# Patient Record
Sex: Male | Born: 1965 | Race: White | Hispanic: No | Marital: Single | Smoking: Current every day smoker
Health system: Southern US, Community
[De-identification: ages and names within clinical notes are randomized; demographics above are authoritative.]

## PROBLEM LIST (undated history)

## (undated) ENCOUNTER — Emergency Department (HOSPITAL_BASED_OUTPATIENT_CLINIC_OR_DEPARTMENT_OTHER): Admission: EM | Payer: BC Managed Care – PPO

## (undated) DIAGNOSIS — F419 Anxiety disorder, unspecified: Secondary | ICD-10-CM

## (undated) DIAGNOSIS — G4700 Insomnia, unspecified: Secondary | ICD-10-CM

## (undated) DIAGNOSIS — L719 Rosacea, unspecified: Secondary | ICD-10-CM

## (undated) DIAGNOSIS — K7010 Alcoholic hepatitis without ascites: Secondary | ICD-10-CM

## (undated) DIAGNOSIS — E785 Hyperlipidemia, unspecified: Secondary | ICD-10-CM

## (undated) DIAGNOSIS — I10 Essential (primary) hypertension: Secondary | ICD-10-CM

## (undated) DIAGNOSIS — M109 Gout, unspecified: Secondary | ICD-10-CM

## (undated) DIAGNOSIS — J45909 Unspecified asthma, uncomplicated: Secondary | ICD-10-CM

## (undated) DIAGNOSIS — J302 Other seasonal allergic rhinitis: Secondary | ICD-10-CM

## (undated) HISTORY — DX: Alcoholic hepatitis without ascites: K70.10

## (undated) HISTORY — PX: FISSURECTOMY: SHX5244

## (undated) HISTORY — DX: Gout, unspecified: M10.9

## (undated) HISTORY — PX: BACK SURGERY: SHX140

---

## 2000-12-19 ENCOUNTER — Encounter: Payer: Self-pay | Admitting: Internal Medicine

## 2000-12-19 ENCOUNTER — Emergency Department (HOSPITAL_COMMUNITY): Admission: EM | Admit: 2000-12-19 | Discharge: 2000-12-19 | Payer: Self-pay | Admitting: Emergency Medicine

## 2001-11-13 ENCOUNTER — Encounter: Admission: RE | Admit: 2001-11-13 | Discharge: 2001-11-13 | Payer: Self-pay | Admitting: Internal Medicine

## 2001-11-13 ENCOUNTER — Encounter: Payer: Self-pay | Admitting: Internal Medicine

## 2003-01-28 ENCOUNTER — Ambulatory Visit (HOSPITAL_COMMUNITY): Admission: RE | Admit: 2003-01-28 | Discharge: 2003-01-28 | Payer: Self-pay | Admitting: *Deleted

## 2005-09-20 ENCOUNTER — Encounter: Admission: RE | Admit: 2005-09-20 | Discharge: 2005-09-20 | Payer: Self-pay | Admitting: Internal Medicine

## 2011-02-01 ENCOUNTER — Other Ambulatory Visit: Payer: Self-pay | Admitting: Internal Medicine

## 2011-02-01 DIAGNOSIS — IMO0002 Reserved for concepts with insufficient information to code with codable children: Secondary | ICD-10-CM

## 2011-02-02 ENCOUNTER — Ambulatory Visit
Admission: RE | Admit: 2011-02-02 | Discharge: 2011-02-02 | Disposition: A | Discharge status: Home / Self Care | Payer: BC Managed Care – PPO | Source: Ambulatory Visit | Attending: Internal Medicine | Admitting: Internal Medicine

## 2011-02-02 DIAGNOSIS — IMO0002 Reserved for concepts with insufficient information to code with codable children: Secondary | ICD-10-CM

## 2011-02-02 LAB — HEPATITIS C ANTIBODY

## 2011-02-02 MED ORDER — IOHEXOL 180 MG/ML  SOLN
1.0000 mL | Freq: Once | INTRAMUSCULAR | Status: AC | PRN
  Administered 2011-02-02: 1 mL via EPIDURAL

## 2011-02-02 MED ORDER — METHYLPREDNISOLONE ACETATE 40 MG/ML INJ SUSP (RADIOLOG
120.0000 mg | Freq: Once | INTRAMUSCULAR | Status: AC
  Administered 2011-02-02: 120 mg via EPIDURAL

## 2011-02-15 ENCOUNTER — Other Ambulatory Visit: Payer: Self-pay | Admitting: Internal Medicine

## 2011-02-15 DIAGNOSIS — M549 Dorsalgia, unspecified: Secondary | ICD-10-CM

## 2011-02-16 ENCOUNTER — Ambulatory Visit
Admission: RE | Admit: 2011-02-16 | Discharge: 2011-02-16 | Disposition: A | Discharge status: Home / Self Care | Payer: BC Managed Care – PPO | Source: Ambulatory Visit | Attending: Internal Medicine | Admitting: Internal Medicine

## 2011-02-16 ENCOUNTER — Other Ambulatory Visit: Payer: Self-pay | Admitting: Internal Medicine

## 2011-02-16 DIAGNOSIS — M549 Dorsalgia, unspecified: Secondary | ICD-10-CM

## 2011-02-16 MED ORDER — IOHEXOL 180 MG/ML  SOLN
1.0000 mL | Freq: Once | INTRAMUSCULAR | Status: AC | PRN
  Administered 2011-02-16: 1 mL via EPIDURAL

## 2011-02-16 MED ORDER — METHYLPREDNISOLONE ACETATE 40 MG/ML INJ SUSP (RADIOLOG
120.0000 mg | Freq: Once | INTRAMUSCULAR | Status: AC
  Administered 2011-02-16: 120 mg via EPIDURAL

## 2011-06-20 ENCOUNTER — Ambulatory Visit: Payer: BC Managed Care – PPO | Attending: Orthopaedic Surgery | Admitting: Physical Therapy

## 2011-06-20 DIAGNOSIS — M6281 Muscle weakness (generalized): Secondary | ICD-10-CM | POA: Insufficient documentation

## 2011-06-20 DIAGNOSIS — M545 Low back pain, unspecified: Secondary | ICD-10-CM | POA: Insufficient documentation

## 2011-06-20 DIAGNOSIS — M25559 Pain in unspecified hip: Secondary | ICD-10-CM | POA: Insufficient documentation

## 2011-06-20 DIAGNOSIS — IMO0001 Reserved for inherently not codable concepts without codable children: Secondary | ICD-10-CM | POA: Insufficient documentation

## 2011-06-28 ENCOUNTER — Ambulatory Visit: Payer: BC Managed Care – PPO | Admitting: Physical Therapy

## 2011-07-03 ENCOUNTER — Encounter: Payer: BC Managed Care – PPO | Admitting: Physical Therapy

## 2011-07-05 ENCOUNTER — Ambulatory Visit: Payer: BC Managed Care – PPO | Admitting: Physical Therapy

## 2011-07-12 ENCOUNTER — Encounter: Payer: BC Managed Care – PPO | Admitting: Physical Therapy

## 2011-07-17 ENCOUNTER — Ambulatory Visit: Payer: BC Managed Care – PPO | Attending: Orthopaedic Surgery | Admitting: Physical Therapy

## 2011-07-17 DIAGNOSIS — M25559 Pain in unspecified hip: Secondary | ICD-10-CM | POA: Insufficient documentation

## 2011-07-17 DIAGNOSIS — M545 Low back pain, unspecified: Secondary | ICD-10-CM | POA: Insufficient documentation

## 2011-07-17 DIAGNOSIS — IMO0001 Reserved for inherently not codable concepts without codable children: Secondary | ICD-10-CM | POA: Insufficient documentation

## 2011-07-17 DIAGNOSIS — M6281 Muscle weakness (generalized): Secondary | ICD-10-CM | POA: Insufficient documentation

## 2011-07-24 ENCOUNTER — Ambulatory Visit: Payer: BC Managed Care – PPO | Admitting: Physical Therapy

## 2011-07-26 ENCOUNTER — Ambulatory Visit: Payer: BC Managed Care – PPO | Admitting: Physical Therapy

## 2011-07-31 ENCOUNTER — Ambulatory Visit: Payer: BC Managed Care – PPO | Admitting: Physical Therapy

## 2011-08-03 ENCOUNTER — Ambulatory Visit: Payer: BC Managed Care – PPO | Admitting: Physical Therapy

## 2011-08-14 ENCOUNTER — Ambulatory Visit: Payer: BC Managed Care – PPO | Admitting: Physical Therapy

## 2011-12-19 ENCOUNTER — Other Ambulatory Visit: Payer: Self-pay | Admitting: Internal Medicine

## 2011-12-19 DIAGNOSIS — M549 Dorsalgia, unspecified: Secondary | ICD-10-CM

## 2011-12-27 ENCOUNTER — Ambulatory Visit
Admission: RE | Admit: 2011-12-27 | Discharge: 2011-12-27 | Disposition: A | Discharge status: Home / Self Care | Payer: BC Managed Care – PPO | Source: Ambulatory Visit | Attending: Internal Medicine | Admitting: Internal Medicine

## 2011-12-27 DIAGNOSIS — M549 Dorsalgia, unspecified: Secondary | ICD-10-CM

## 2011-12-27 MED ORDER — IOHEXOL 180 MG/ML  SOLN
1.0000 mL | Freq: Once | INTRAMUSCULAR | Status: AC | PRN
  Administered 2011-12-27: 1 mL via EPIDURAL

## 2011-12-27 MED ORDER — METHYLPREDNISOLONE ACETATE 40 MG/ML INJ SUSP (RADIOLOG
120.0000 mg | Freq: Once | INTRAMUSCULAR | Status: AC
  Administered 2011-12-27: 120 mg via EPIDURAL

## 2012-01-01 ENCOUNTER — Telehealth: Payer: Self-pay | Admitting: Radiology

## 2012-01-01 NOTE — Telephone Encounter (Signed)
Pt c/o a lot of soreness, but did play 9 holes of golf this past weekend.  Also, is now seeing a chiropractor for adjustments and is using ice and heat prn. Asked if the injection can take enough of the inflammation away to make the pain go away. Explained that with time the injections could help with his pain. That he could have 3 every 6 months if they are working well for him.

## 2012-06-13 ENCOUNTER — Emergency Department (INDEPENDENT_AMBULATORY_CARE_PROVIDER_SITE_OTHER)
Admission: EM | Admit: 2012-06-13 | Discharge: 2012-06-13 | Disposition: A | Discharge status: Home / Self Care | Payer: BC Managed Care – PPO | Source: Home / Self Care

## 2012-06-13 ENCOUNTER — Encounter: Payer: Self-pay | Admitting: *Deleted

## 2012-06-13 DIAGNOSIS — R350 Frequency of micturition: Secondary | ICD-10-CM

## 2012-06-13 DIAGNOSIS — R300 Dysuria: Secondary | ICD-10-CM

## 2012-06-13 DIAGNOSIS — Z202 Contact with and (suspected) exposure to infections with a predominantly sexual mode of transmission: Secondary | ICD-10-CM

## 2012-06-13 HISTORY — DX: Anxiety disorder, unspecified: F41.9

## 2012-06-13 HISTORY — DX: Essential (primary) hypertension: I10

## 2012-06-13 HISTORY — DX: Unspecified asthma, uncomplicated: J45.909

## 2012-06-13 HISTORY — DX: Insomnia, unspecified: G47.00

## 2012-06-13 HISTORY — DX: Hyperlipidemia, unspecified: E78.5

## 2012-06-13 HISTORY — DX: Other seasonal allergic rhinitis: J30.2

## 2012-06-13 HISTORY — DX: Rosacea, unspecified: L71.9

## 2012-06-13 LAB — POCT URINALYSIS DIP (MANUAL ENTRY)
Bilirubin, UA: NEGATIVE
Glucose, UA: NEGATIVE
Ketones, POC UA: NEGATIVE
Leukocytes, UA: NEGATIVE
Nitrite, UA: NEGATIVE
pH, UA: 6 (ref 5–8)

## 2012-06-13 MED ORDER — CIPROFLOXACIN HCL 250 MG PO TABS: 250.0000 mg | ORAL_TABLET | Freq: Two times a day (BID) | ORAL | Status: DC

## 2012-06-13 NOTE — ED Provider Notes (Signed)
History     CSN: [id number]  Arrival date & time 06/13/12  1313   None     Chief Complaint  Patient presents with  . Urinary Frequency  . Abdominal Pain  . Flank Pain      HPI Comments: Patient complains of onset of increased urine frequency about 10 days ago without discomfort.  Two days ago he developed lower abdominal pressure sensation and mild bilateral flank discomfort.  Today he has had some vague discomfort in testicles.  He denies urethral discharge.  No fevers, chills, and sweats.  No recent sexual activity.  Patient is a 47 y.o. male presenting with frequency. The history is provided by the patient.  Urinary Frequency This is a new problem. Episode onset: 10 days ago. The problem occurs constantly. The problem has not changed since onset.Associated symptoms comments: Lower abdominal pressure. Nothing aggravates the symptoms. Nothing relieves the symptoms. He has tried nothing for the symptoms.    Past Medical History  Diagnosis Date  . Rosacea   . Anxiety   . Insomnia   . Hypertension   . Hyperlipemia   . Asthma   . Seasonal allergies     Past Surgical History  Procedure Laterality Date  . Back surgery    . Fissurectomy      History reviewed. No pertinent family history.  History  Substance Use Topics  . Smoking status: Current Every Day Smoker -- 0.25 packs/day for 25 years    Types: Cigarettes, Cigars  . Smokeless tobacco: Never Used  . Alcohol Use: Yes      Review of Systems  Genitourinary: Positive for frequency.  All other systems reviewed and are negative.    Allergies  Acetaminophen  Home Medications   Current Outpatient Rx  Name  Route  Sig  Dispense  Refill  . albuterol (PROVENTIL HFA;VENTOLIN HFA) 108 (90 BASE) MCG/ACT inhaler   Inhalation   Inhale 2 puffs into the lungs every 6 (six) hours as needed for wheezing.         Marland Kitchen ALPRAZolam (XANAX) 0.25 MG tablet   Oral   Take 0.25 mg by mouth at bedtime as needed for sleep.         Marland Kitchen amLODipine (NORVASC) 5 MG tablet   Oral   Take 5 mg by mouth daily.         Marland Kitchen ezetimibe (ZETIA) 10 MG tablet   Oral   Take 10 mg by mouth daily.         . Fluticasone-Salmeterol (ADVAIR) 250-50 MCG/DOSE AEPB   Inhalation   Inhale 1 puff into the lungs every 12 (twelve) hours.         . montelukast (SINGULAIR) 10 MG tablet   Oral   Take 10 mg by mouth at bedtime.         . sulfamethoxazole-trimethoprim (BACTRIM DS) 800-160 MG per tablet   Oral   Take 1 tablet by mouth 2 (two) times daily.         . ciprofloxacin (CIPRO) 250 MG tablet   Oral   Take 1 tablet (250 mg total) by mouth 2 (two) times daily.   14 tablet   0     BP 162/100  Pulse 84  Temp(Src) 98.1 F (36.7 C) (Oral)  Wt 188 lb (85.276 kg)  BMI 26.98 kg/m2  SpO2 96%  Physical Exam Nursing notes and Vital Signs reviewed. Appearance:  Patient appears healthy, stated age, and in no acute distress Eyes:  Pupils are  equal, round, and reactive to light and accomodation.  Extraocular movement is intact.  Conjunctivae are not inflamed  Pharynx:  Normal Neck:  Supple.   No adenopathy Lungs:  Clear to auscultation.  Breath sounds are equal.  Heart:  Regular rate and rhythm without murmurs, rubs, or gallops.  Abdomen:  Nontender without masses or hepatosplenomegaly.  Bowel sounds are present.  No CVA or flank tenderness.  Extremities:  No edema.   Skin:  No rash present.  Genitourinary:  Penis normal without lesions or urethral discharge.  Scrotum is normal.  Testes are descended bilaterally without nodules or tenderness.  No hernias are palpated.  No regional lymphadenopathy palpated   ED Course  Procedures  none  Labs Reviewed  URINE CULTURE pending  GC/CHLAMYDIA PROBE AMP, URINE pending  POCT URINALYSIS DIP (MANUAL ENTRY) SG >= 1.030, otherwise negative      1. Urinary frequency   2. Possible exposure to STD   3. Dysuria       MDM  GC/chlamydia, and urine culture pending. Begin  Cipro Increase fluid intake.  May take AZO for urinary discomfort. Followup with Family Doctor if not improved in one week.          Lattie Haw, MD 06/13/12 [phone]

## 2012-06-13 NOTE — ED Notes (Signed)
Pt c/o urinary frequency x 10 days, with lower abdominal, testicular, and flank pain x today. Denies fever.

## 2012-06-15 LAB — URINE CULTURE
Colony Count: NO GROWTH
Organism ID, Bacteria: NO GROWTH

## 2012-06-16 LAB — GC/CHLAMYDIA PROBE AMP, URINE
Chlamydia, Swab/Urine, PCR: NEGATIVE
GC Probe Amp, Urine: NEGATIVE

## 2012-06-17 ENCOUNTER — Telehealth: Payer: Self-pay | Admitting: *Deleted

## 2013-04-21 LAB — URIC ACID: URIC ACID: 6.4

## 2014-04-01 ENCOUNTER — Ambulatory Visit (INDEPENDENT_AMBULATORY_CARE_PROVIDER_SITE_OTHER): Payer: BC Managed Care – PPO | Admitting: Physical Therapy

## 2014-04-01 DIAGNOSIS — M25559 Pain in unspecified hip: Secondary | ICD-10-CM

## 2014-04-01 DIAGNOSIS — M545 Low back pain: Secondary | ICD-10-CM

## 2014-04-13 ENCOUNTER — Encounter (INDEPENDENT_AMBULATORY_CARE_PROVIDER_SITE_OTHER): Payer: BC Managed Care – PPO | Admitting: Physical Therapy

## 2014-04-13 DIAGNOSIS — M25559 Pain in unspecified hip: Secondary | ICD-10-CM

## 2014-04-13 DIAGNOSIS — M545 Low back pain: Secondary | ICD-10-CM

## 2014-05-25 LAB — CBC AND DIFFERENTIAL
HCT: 46 % (ref 41–53)
Hemoglobin: 16.3 g/dL (ref 13.5–17.5)
Platelets: 206 10*3/uL (ref 150–399)
WBC: 4.8 10^3/mL

## 2014-05-25 LAB — RAPID HIV SCREEN (HIV 1/2 AB+AG): HIV 1/O/2 Abs, Qual: NONREACTIVE

## 2014-05-25 LAB — RPR: RPR: NONREACTIVE

## 2014-05-25 LAB — PSA: PSA: 1.61

## 2014-05-25 LAB — LIPID PANEL
Cholesterol: 256 mg/dL — AB (ref 0–200)
LDL Cholesterol: 131 mg/dL
TRIGLYCERIDES: 392 mg/dL — AB (ref 40–160)

## 2014-05-25 LAB — TSH: TSH: 1.32 u[IU]/mL (ref <=5.90)

## 2014-05-25 LAB — HEMOGLOBIN A1C: Hemoglobin A1C: 5

## 2014-07-05 ENCOUNTER — Other Ambulatory Visit: Payer: Self-pay | Admitting: Internal Medicine

## 2014-07-05 DIAGNOSIS — M5136 Other intervertebral disc degeneration, lumbar region: Secondary | ICD-10-CM

## 2014-07-12 ENCOUNTER — Other Ambulatory Visit: Payer: Self-pay | Admitting: Internal Medicine

## 2014-07-12 DIAGNOSIS — M5432 Sciatica, left side: Secondary | ICD-10-CM

## 2014-07-12 DIAGNOSIS — M5136 Other intervertebral disc degeneration, lumbar region: Secondary | ICD-10-CM

## 2014-07-12 DIAGNOSIS — M5416 Radiculopathy, lumbar region: Secondary | ICD-10-CM

## 2014-07-15 ENCOUNTER — Ambulatory Visit
Admission: RE | Admit: 2014-07-15 | Discharge: 2014-07-15 | Disposition: A | Discharge status: Home / Self Care | Payer: BLUE CROSS/BLUE SHIELD | Source: Ambulatory Visit | Attending: Internal Medicine | Admitting: Internal Medicine

## 2014-07-15 DIAGNOSIS — M5416 Radiculopathy, lumbar region: Secondary | ICD-10-CM

## 2014-07-15 DIAGNOSIS — M5432 Sciatica, left side: Secondary | ICD-10-CM

## 2014-07-15 DIAGNOSIS — M5136 Other intervertebral disc degeneration, lumbar region: Secondary | ICD-10-CM

## 2014-07-15 MED ORDER — IOHEXOL 180 MG/ML  SOLN
1.0000 mL | Freq: Once | INTRAMUSCULAR | Status: AC | PRN
Start: 2014-07-15 — End: 2014-07-15
  Administered 2014-07-15: 1 mL via EPIDURAL

## 2014-07-15 MED ORDER — METHYLPREDNISOLONE ACETATE 40 MG/ML INJ SUSP (RADIOLOG
120.0000 mg | Freq: Once | INTRAMUSCULAR | Status: AC
  Administered 2014-07-15: 120 mg via EPIDURAL

## 2014-07-15 NOTE — Discharge Instructions (Signed)

## 2014-07-16 ENCOUNTER — Telehealth: Payer: Self-pay | Admitting: Radiology

## 2014-07-16 NOTE — Telephone Encounter (Signed)
Pt called and said he still had the same pain he has had. Wanted to know if injection had not worked because he still had pain. Explained it would take 2-5 days for the steroid to work and 7-10 days to know if it had taken care of his pain. Also explained that discomfort at the injection site is normal and will go away in a few days.

## 2014-07-28 ENCOUNTER — Other Ambulatory Visit: Payer: Self-pay | Admitting: Internal Medicine

## 2014-07-28 DIAGNOSIS — M5136 Other intervertebral disc degeneration, lumbar region: Secondary | ICD-10-CM

## 2014-08-03 ENCOUNTER — Other Ambulatory Visit: Payer: Self-pay | Admitting: Internal Medicine

## 2014-08-05 ENCOUNTER — Ambulatory Visit
Admission: RE | Admit: 2014-08-05 | Discharge: 2014-08-05 | Disposition: A | Discharge status: Home / Self Care | Payer: BLUE CROSS/BLUE SHIELD | Source: Ambulatory Visit | Attending: Internal Medicine | Admitting: Internal Medicine

## 2014-08-05 ENCOUNTER — Other Ambulatory Visit: Payer: BLUE CROSS/BLUE SHIELD

## 2014-08-05 DIAGNOSIS — M5136 Other intervertebral disc degeneration, lumbar region: Secondary | ICD-10-CM

## 2014-08-05 MED ORDER — METHYLPREDNISOLONE ACETATE 40 MG/ML INJ SUSP (RADIOLOG
120.0000 mg | Freq: Once | INTRAMUSCULAR | Status: AC
  Administered 2014-08-05: 120 mg via EPIDURAL

## 2014-08-05 MED ORDER — IOHEXOL 180 MG/ML  SOLN
1.0000 mL | Freq: Once | INTRAMUSCULAR | Status: AC | PRN
  Administered 2014-08-05: 1 mL via EPIDURAL

## 2014-08-05 NOTE — Discharge Instructions (Signed)

## 2015-02-03 LAB — BASIC METABOLIC PANEL
Creatinine: 1 mg/dL (ref 0.6–1.3)
Glucose: 96 mg/dL
POTASSIUM: 4.2 mmol/L (ref 3.4–5.3)
Sodium: 138 mmol/L (ref 137–147)

## 2015-02-03 LAB — HEPATIC FUNCTION PANEL
ALT: 100 U/L — AB (ref 10–40)
AST: 57 U/L — AB (ref 14–40)

## 2015-03-14 DIAGNOSIS — K2100 Gastro-esophageal reflux disease with esophagitis, without bleeding: Secondary | ICD-10-CM | POA: Insufficient documentation

## 2015-03-14 DIAGNOSIS — M5136 Other intervertebral disc degeneration, lumbar region: Secondary | ICD-10-CM | POA: Insufficient documentation

## 2015-03-14 DIAGNOSIS — M48061 Spinal stenosis, lumbar region without neurogenic claudication: Secondary | ICD-10-CM | POA: Insufficient documentation

## 2015-03-14 DIAGNOSIS — R748 Abnormal levels of other serum enzymes: Secondary | ICD-10-CM | POA: Insufficient documentation

## 2015-04-20 ENCOUNTER — Encounter: Payer: Self-pay | Admitting: *Deleted

## 2015-04-20 ENCOUNTER — Emergency Department (INDEPENDENT_AMBULATORY_CARE_PROVIDER_SITE_OTHER)
Admission: EM | Admit: 2015-04-20 | Discharge: 2015-04-20 | Disposition: A | Discharge status: Home / Self Care | Payer: BLUE CROSS/BLUE SHIELD | Source: Home / Self Care | Attending: Family Medicine | Admitting: Family Medicine

## 2015-04-20 DIAGNOSIS — J069 Acute upper respiratory infection, unspecified: Secondary | ICD-10-CM

## 2015-04-20 DIAGNOSIS — J019 Acute sinusitis, unspecified: Secondary | ICD-10-CM | POA: Diagnosis not present

## 2015-04-20 MED ORDER — AZITHROMYCIN 250 MG PO TABS: 250.0000 mg | ORAL_TABLET | Freq: Every day | ORAL | Status: DC

## 2015-04-20 NOTE — ED Notes (Signed)
Pt c/o productive cough and nasal congestion x 2 wks, worse x 1 day. Denies fever.

## 2015-04-20 NOTE — Discharge Instructions (Signed)
You may take 400-600mg Ibuprofen (Motrin) every 6-8 hours for fever and pain  °Alternate with Tylenol  °You may take 500mg Tylenol every 4-6 hours as needed for fever and pain  °Follow-up with your primary care provider next week for recheck of symptoms if not improving.  °Be sure to drink plenty of fluids and rest, at least 8hrs of sleep a night, preferably more while you are sick. °Return urgent care or go to closest ER if you cannot keep down fluids/signs of dehydration, fever not reducing with Tylenol, difficulty breathing/wheezing, stiff neck, worsening condition, or other concerns (see below)  °Please take antibiotics as prescribed and be sure to complete entire course even if you start to feel better to ensure infection does not come back. ° ° °Cool Mist Vaporizers °Vaporizers may help relieve the symptoms of a cough and cold. They add moisture to the air, which helps mucus to become thinner and less sticky. This makes it easier to breathe and cough up secretions. Cool mist vaporizers do not cause serious burns like hot mist vaporizers, which may also be called steamers or humidifiers. Vaporizers have not been proven to help with colds. You should not use a vaporizer if you are allergic to mold. °HOME CARE INSTRUCTIONS °· Follow the package instructions for the vaporizer. °· Do not use anything other than distilled water in the vaporizer. °· Do not run the vaporizer all of the time. This can cause mold or bacteria to grow in the vaporizer. °· Clean the vaporizer after each time it is used. °· Clean and dry the vaporizer well before storing it. °· Stop using the vaporizer if worsening respiratory symptoms develop. °  °This information is not intended to replace advice given to you by your health care provider. Make sure you discuss any questions you have with your health care provider. °  °Document Released: 12/29/2003 Document Revised: 04/07/2013 Document Reviewed: 08/20/2012 °Elsevier Interactive Patient  Education ©2016 [employer] ° °Sinus Rinse °WHAT IS A SINUS RINSE? °A sinus rinse is a home treatment. It rinses your sinuses with a mixture of salt and water (saline solution). Sinuses are air-filled spaces in your skull behind the bones of your face and forehead. They open into your nasal cavity. °To do a sinus rinse, you will need: °· Saline solution. °· Neti pot or spray bottle. This releases the saline solution into your nose and through your sinuses. You can buy neti pots and spray bottles at: °¨ Your local pharmacy. °¨ A health food store. °¨ Online. °WHEN WOULD I DO A SINUS RINSE?  °A sinus rinse can help to clear your nasal cavity. It can clear:  °· Mucus. °· Dirt. °· Dust. °· Pollen. °You may do a sinus rinse when you have: °· A cold. °· A virus. °· Allergies. °· A sinus infection. °· A stuffy nose. °If you are considering a sinus rinse: °· Ask your child's doctor before doing a sinus rinse on your child. °· Do not do a sinus rinse if you have had: °¨ Ear or nasal surgery. °¨ An ear infection. °¨ Blocked ears. °HOW DO I DO A SINUS RINSE?  °· Wash your hands. °· Disinfect your device using the directions that came with the device. °· Dry your device. °· Use the solution that comes with your device or one that is sold separately in stores. Follow the mixing directions on the package. °· Fill your device with the amount of saline solution as stated in the device instructions. °·   Stand over a sink and tilt your head sideways over the sink. °· Place the spout of the device in your upper nostril (the one closer to the ceiling). °· Gently pour or squeeze the saline solution into the nasal cavity. The liquid should drain to the lower nostril if you are not too congested. °· Gently blow your nose. Blowing too hard may cause ear pain. °· Repeat in the other nostril. °· Clean and rinse your device with clean water. °· Air-dry your device. °ARE THERE RISKS OF A SINUS RINSE?  °Sinus rinse is normally very safe and  helpful. However, there are a few risks, which include:  °· A burning feeling in the sinuses. This may happen if you do not make the saline solution as instructed. Make sure to follow all directions when making the saline solution. °· Infection from unclean water. This is rare, but possible. °· Nasal irritation. °  °This information is not intended to replace advice given to you by your health care provider. Make sure you discuss any questions you have with your health care provider. °  °Document Released: 10/28/2013 Document Reviewed: 10/28/2013 °Elsevier Interactive Patient Education ©2016 [employer] ° °

## 2015-04-20 NOTE — ED Provider Notes (Signed)
CSN: [id number]     Arrival date & time 04/20/15  1230 History   First MD Initiated Contact with Patient 04/20/15 1235     Chief Complaint  Patient presents with  . Cough   (Consider location/radiation/quality/duration/timing/severity/associated sxs/prior Treatment) HPI  Pt is a 50yo male presenting to [facility] with c/o 2 weeks of mild congestion with mild intermittent cough that has worsened suddenly yesterday with facial pain and nasal congestion.  He states he recently lost his PCP and looking for a new one.  States he typically gets a sinus infection about twice a year. Pt states Azithromycin works best for him as other antibiotics cause GI upset so he never finishes the course.  Denies fever, n/v/d.  Pt states "everyone" around him is also sick. Denies recent travel.  He has been using his Ventolin more recently.    Past Medical History  Diagnosis Date  . Rosacea   . Anxiety   . Insomnia   . Hypertension   . Hyperlipemia   . Asthma   . Seasonal allergies    Past Surgical History  Procedure Laterality Date  . Back surgery    . Fissurectomy     History reviewed. No pertinent family history. Social History  Substance Use Topics  . Smoking status: Current Every Day Smoker -- 0.25 packs/day for 25 years    Types: Cigarettes, Cigars  . Smokeless tobacco: Never Used  . Alcohol Use: Yes    Review of Systems  Constitutional: Negative for fever and chills.  HENT: Positive for congestion, ear pain, postnasal drip, rhinorrhea, sinus pressure and sneezing. Negative for sore throat, trouble swallowing and voice change.   Respiratory: Positive for cough and shortness of breath.   Cardiovascular: Negative for chest pain and palpitations.  Gastrointestinal: Negative for nausea, vomiting, abdominal pain and diarrhea.  Musculoskeletal: Negative for myalgias, back pain and arthralgias.  Skin: Negative for rash.    Allergies  Tylenol  Home Medications   Prior to Admission medications    Medication Sig Start Date End Date Taking? Authorizing Provider  ADVAIR DISKUS 100-50 MCG/DOSE AEPB  04/11/14  Yes Historical Provider, MD  albuterol (PROVENTIL HFA;VENTOLIN HFA) 108 (90 BASE) MCG/ACT inhaler Inhale 2 puffs into the lungs every 6 (six) hours as needed for wheezing.   Yes Historical Provider, MD  ALPRAZolam Prudy Feeler(XANAX) 0.25 MG tablet Take 0.25 mg by mouth at bedtime as needed for sleep.   Yes Historical Provider, MD  ezetimibe (ZETIA) 10 MG tablet Take 10 mg by mouth daily.   Yes Historical Provider, MD  irbesartan (AVAPRO) 300 MG tablet Take 300 mg by mouth daily.   Yes Historical Provider, MD  simvastatin (ZOCOR) 10 MG tablet Take 10 mg by mouth daily.   Yes Historical Provider, MD  amLODipine (NORVASC) 5 MG tablet Take 5 mg by mouth daily.    Historical Provider, MD  azithromycin (ZITHROMAX) 250 MG tablet Take 1 tablet (250 mg total) by mouth daily. Take first 2 tablets together, then 1 every day until finished. 04/20/15   Junius FinnerErin O'Malley, PA-C  fluticasone Aleda Grana(FLONASE) 50 MCG/ACT nasal spray  04/11/14   Historical Provider, MD  Fluticasone-Salmeterol (ADVAIR) 250-50 MCG/DOSE AEPB Inhale 1 puff into the lungs every 12 (twelve) hours.    Historical Provider, MD  lansoprazole (PREVACID) 30 MG capsule  04/11/14   Historical Provider, MD  montelukast (SINGULAIR) 10 MG tablet Take 10 mg by mouth at bedtime.    Historical Provider, MD   Meds Ordered and Administered this Visit  Medications - No data to display  BP 154/92 mmHg  Pulse 77  Temp(Src) 98.2 F (36.8 C) (Oral)  Resp 16  Ht 5\' 10"  (1.778 m)  Wt 186 lb (84.369 kg)  BMI 26.69 kg/m2  SpO2 97% No data found.   Physical Exam  Constitutional: He appears well-developed and well-nourished.  HENT:  Head: Normocephalic and atraumatic.  Right Ear: Hearing, tympanic membrane, external ear and ear canal normal.  Left Ear: Hearing, tympanic membrane, external ear and ear canal normal.  Nose: Mucosal edema present. Right sinus  exhibits maxillary sinus tenderness and frontal sinus tenderness. Left sinus exhibits maxillary sinus tenderness. Left sinus exhibits no frontal sinus tenderness.  Mouth/Throat: Uvula is midline, oropharynx is clear and moist and mucous membranes are normal.  Eyes: Conjunctivae are normal. No scleral icterus.  Neck: Normal range of motion. Neck supple.  Cardiovascular: Normal rate, regular rhythm and normal heart sounds.   Pulmonary/Chest: Effort normal and breath sounds normal. No stridor. No respiratory distress. He has no wheezes. He has no rales. He exhibits no tenderness.  Abdominal: Soft. He exhibits no distension and no mass. There is no tenderness. There is no rebound and no guarding.  Musculoskeletal: Normal range of motion.  Lymphadenopathy:    He has no cervical adenopathy.  Neurological: He is alert.  Skin: Skin is warm and dry.  Nursing note and vitals reviewed.   ED Course  Procedures (including critical care time)  Labs Review Labs Reviewed - No data to display  Imaging Review No results found.    MDM   1. Acute rhinosinusitis   2. Acute upper respiratory infection    Pt c/o worsening URI symptoms and sinus congestion with sinus pressure. Hx of recurrent sinus infections.   Rx: azithromycin Advised pt to use acetaminophen and ibuprofen as needed for fever and pain. Encouraged rest and fluids. F/u with PCP in 7-10 days if not improving, sooner if worsening. Pt verbalized understanding and agreement with tx plan.     Junius Finner, PA-C 04/20/15 1342

## 2015-04-24 ENCOUNTER — Telehealth: Payer: Self-pay | Admitting: Emergency Medicine

## 2015-04-25 ENCOUNTER — Ambulatory Visit: Payer: BLUE CROSS/BLUE SHIELD | Admitting: Family Medicine

## 2015-04-25 ENCOUNTER — Ambulatory Visit: Payer: BLUE CROSS/BLUE SHIELD | Admitting: Osteopathic Medicine

## 2015-04-28 ENCOUNTER — Ambulatory Visit (INDEPENDENT_AMBULATORY_CARE_PROVIDER_SITE_OTHER): Payer: BLUE CROSS/BLUE SHIELD | Admitting: Family Medicine

## 2015-04-28 ENCOUNTER — Encounter: Payer: Self-pay | Admitting: Family Medicine

## 2015-04-28 VITALS — BP 137/93 | HR 86 | Wt 187.0 lb

## 2015-04-28 DIAGNOSIS — F411 Generalized anxiety disorder: Secondary | ICD-10-CM

## 2015-04-28 DIAGNOSIS — M4806 Spinal stenosis, lumbar region: Secondary | ICD-10-CM

## 2015-04-28 DIAGNOSIS — R945 Abnormal results of liver function studies: Secondary | ICD-10-CM

## 2015-04-28 DIAGNOSIS — M25562 Pain in left knee: Secondary | ICD-10-CM

## 2015-04-28 DIAGNOSIS — K7010 Alcoholic hepatitis without ascites: Secondary | ICD-10-CM | POA: Insufficient documentation

## 2015-04-28 DIAGNOSIS — E785 Hyperlipidemia, unspecified: Secondary | ICD-10-CM | POA: Diagnosis not present

## 2015-04-28 DIAGNOSIS — E781 Pure hyperglyceridemia: Secondary | ICD-10-CM | POA: Insufficient documentation

## 2015-04-28 DIAGNOSIS — I10 Essential (primary) hypertension: Secondary | ICD-10-CM | POA: Diagnosis not present

## 2015-04-28 DIAGNOSIS — H31001 Unspecified chorioretinal scars, right eye: Secondary | ICD-10-CM

## 2015-04-28 DIAGNOSIS — R7989 Other specified abnormal findings of blood chemistry: Secondary | ICD-10-CM

## 2015-04-28 DIAGNOSIS — J4530 Mild persistent asthma, uncomplicated: Secondary | ICD-10-CM

## 2015-04-28 DIAGNOSIS — H31009 Unspecified chorioretinal scars, unspecified eye: Secondary | ICD-10-CM | POA: Insufficient documentation

## 2015-04-28 DIAGNOSIS — M48061 Spinal stenosis, lumbar region without neurogenic claudication: Secondary | ICD-10-CM

## 2015-04-28 DIAGNOSIS — M109 Gout, unspecified: Secondary | ICD-10-CM | POA: Diagnosis not present

## 2015-04-28 DIAGNOSIS — L719 Rosacea, unspecified: Secondary | ICD-10-CM

## 2015-04-28 DIAGNOSIS — E782 Mixed hyperlipidemia: Secondary | ICD-10-CM | POA: Insufficient documentation

## 2015-04-28 HISTORY — DX: Gout, unspecified: M10.9

## 2015-04-28 MED ORDER — AMLODIPINE BESYLATE 5 MG PO TABS: 5.0000 mg | ORAL_TABLET | Freq: Every day | ORAL | Status: DC

## 2015-04-28 MED ORDER — EZETIMIBE 10 MG PO TABS: 10.0000 mg | ORAL_TABLET | Freq: Every day | ORAL | Status: DC

## 2015-04-28 MED ORDER — MONTELUKAST SODIUM 10 MG PO TABS: 10.0000 mg | ORAL_TABLET | Freq: Every day | ORAL | Status: DC

## 2015-04-28 MED ORDER — ALPRAZOLAM 0.25 MG PO TABS: 0.2500 mg | ORAL_TABLET | Freq: Two times a day (BID) | ORAL | Status: DC | PRN

## 2015-04-28 MED ORDER — ADVAIR DISKUS 100-50 MCG/DOSE IN AEPB: 1.0000 | INHALATION_SPRAY | Freq: Two times a day (BID) | RESPIRATORY_TRACT | Status: DC

## 2015-04-28 MED ORDER — COLCHICINE 0.6 MG PO TABS: 0.6000 mg | ORAL_TABLET | Freq: Every day | ORAL | Status: DC | PRN

## 2015-04-28 MED ORDER — ALBUTEROL SULFATE HFA 108 (90 BASE) MCG/ACT IN AERS: INHALATION_SPRAY | RESPIRATORY_TRACT | Status: DC

## 2015-04-28 MED ORDER — EPLERENONE 25 MG PO TABS: 25.0000 mg | ORAL_TABLET | Freq: Every day | ORAL | Status: DC

## 2015-04-28 MED ORDER — GABAPENTIN 100 MG PO CAPS: 100.0000 mg | ORAL_CAPSULE | Freq: Two times a day (BID) | ORAL | Status: DC

## 2015-04-28 MED ORDER — IRBESARTAN 300 MG PO TABS: 300.0000 mg | ORAL_TABLET | Freq: Every day | ORAL | Status: DC

## 2015-04-28 MED ORDER — LANSOPRAZOLE 30 MG PO CPDR: 30.0000 mg | DELAYED_RELEASE_CAPSULE | Freq: Every day | ORAL | Status: DC

## 2015-04-28 NOTE — Progress Notes (Signed)
CC: Gabriel Wong is a 50 y.o. male is here for Establish Care and Medication Management   Subjective: HPI:  Gabriel Wong 50 year old here to establish care  He has a history of asthma that's been spanning back for decades. He gets about 2 asthma exacerbations here despite using Singulair and Advair. He just recently got over an asthma exacerbation that was treated with albuterol and azithromycin. He denies any wheezing or shortness of breath at the present time. Provided he is not sick from a respiratory illness he does not need albuterol.  He has a history of essential hypertension currently taking amlodipine,eplerenone, and Avapro. Outside blood pressure review has shown that as recently as one year ago he's had stage II hypertension despite taking this. No recent blood pressures report. He reports 100% compliance with his hypertension medication.  He has a history of hyperlipidemia with hypertriglyceridemia. He is currently taking Zetia on a daily basis. He was encouraged to start on a statin by his former provider but he had a bad experience with Lipitor and decided not to start taking simvastatin. Most recent lipid panel was last fall with LDL of 130 and triglycerides in the 300s.  He suffers from gout with flares a a few times every year. It always involves one of his great toes. It goes away within a day he takes colchicine.  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 His ophthalmologist started him on eplerenone at that time and has prevented any further damage from occurring to the retina. He denies any vision loss. He denies any ocular pain. He's had his potassium checked frequently since being on this medication and has never had hyperkalemia.  He doesn't he suffers from anxiety and panic attacks for the majority of his life and has been worsened ever since he lost custody of his 2 children following the divorce of  his now ex-wife. He takes half to a full tablet of Xanax twice a day to help with anxiety. He is quite happy with his current regimen right now. Gabriel Wong controlled substance database was reviewed and confirms his report of prior prescriptions.  Complains of left knee pain that has been going on for over a year now. He's had x-rays done which were unremarkable. He tells me he gets the sudden pop sensation and feels like his knee is almost dislocated. This will last for a few days and without any intervention it will make another popping sensation and instantly feels better. Right now he is not expense in any pain. He denies any swelling or redness localizes the pain behind the kneecap.  He has a history of spinal stenosis that resulted in a laminectomy. He tells me his back pain is minimal, almost absent and well managed with gabapentin right now. He denies any motor or sensory disturbances in the lower extremities.  He has a history of elevated LFTs which was investigated with undetectable hepatitis C antibody, upper endoscopy, and abdominal ultrasound which showed fatty infiltration of the liver. He tells me these other LFTs were due to using alcohol to self medicate his chronic back pain in the past. On outside records it looks like he's also gotten in trouble with the law due to his drinking. He tells me he takes 10-12 drinks a week. Currently denies right upper quadrant pain.  He also has a history of rosacea which she currently is not taking anything for about one time  was taking doxycycline.   Review of Systems - General ROS: negative for - chills, fever, night sweats, weight gain or weight loss Ophthalmic ROS: negative for - decreased vision ENT ROS: negative for - hearing change, nasal congestion, tinnitus or allergies Hematological and Lymphatic ROS: negative for - bleeding problems, bruising or swollen lymph nodes Breast ROS: negative Respiratory ROS: no cough, shortness of breath,  or wheezing Cardiovascular ROS: no chest pain or dyspnea on exertion Gastrointestinal ROS: no abdominal pain, change in bowel habits, or black or bloody stools Genito-Urinary ROS: negative for - genital discharge, genital ulcers, incontinence or abnormal bleeding from genitals Musculoskeletal ROS: negative for - joint pain or muscle pain Neurological ROS: negative for - headaches or memory loss Dermatological ROS: negative for lumps, mole changes, rash and skin lesion changes  Past Medical History  Diagnosis Date  . Rosacea   . Anxiety   . Insomnia   . Hypertension   . Hyperlipemia   . Asthma   . Seasonal allergies   . Alcoholic hepatitis   . Gout 04/28/2015    Past Surgical History  Procedure Laterality Date  . Back surgery    . Fissurectomy     No family history on file.  Social History   Social History  . Marital Status: Single    Spouse Name: N/A  . Number of Children: N/A  . Years of Education: N/A   Occupational History  . Not on file.   Social History Main Topics  . Smoking status: Current Every Day Smoker -- 0.25 packs/day for 25 years    Types: Cigarettes, Cigars  . Smokeless tobacco: Never Used  . Alcohol Use: Yes  . Drug Use: No  . Sexual Activity: Not on file   Other Topics Concern  . Not on file   Social History Narrative     Objective: BP 137/93 mmHg  Pulse 86  Wt 187 lb (84.823 kg)  SpO2 95%  General: Alert and Oriented, No Acute Distress HEENT: Pupils equal, round, reactive to light. Conjunctivae clear.  Moist mucous membranes pharynx unremarkable Lungs: Clear to auscultation bilaterally, no wheezing/ronchi/rales.  Comfortable work of breathing. Good air movement. Cardiac: Regular rate and rhythm. Normal S1/S2.  No murmurs, rubs, nor gallops.   Abdomen: soft and flat Extremities: No peripheral edema.  Strong peripheral pulses.  Mental Status: No depression, anxiety, nor agitation. Skin: Warm and dry.  Assessment & Plan: Gabriel Wong was seen  today for establish care and medication management.  Diagnoses and all orders for this visit:  Asthma, mild persistent, uncomplicated  Essential hypertension  Hyperlipidemia  Gout without tophus, unspecified cause, unspecified chronicity, unspecified site  Retinal scar, right  Generalized anxiety disorder  Left knee pain  Spinal stenosis of lumbar region  Elevated LFTs  Rosacea  Other orders -     colchicine 0.6 MG tablet; Take 1 tablet (0.6 mg total) by mouth daily as needed. -     eplerenone (INSPRA) 25 MG tablet; Take 1 tablet (25 mg total) by mouth daily. -     ALPRAZolam (XANAX) 0.25 MG tablet; Take 1 tablet (0.25 mg total) by mouth 2 (two) times daily as needed for anxiety. -     Discontinue: gabapentin (NEURONTIN) 100 MG capsule; Take 1 capsule (100 mg total) by mouth 2 (two) times daily. -     ADVAIR DISKUS 100-50 MCG/DOSE AEPB; Inhale 1 puff into the lungs 2 (two) times daily. -     ezetimibe (ZETIA) 10 MG tablet; Take 1  tablet (10 mg total) by mouth daily. -     albuterol (PROVENTIL HFA;VENTOLIN HFA) 108 (90 Base) MCG/ACT inhaler; Inhale two puffs every 4-6 hours only as needed for shortness of breath or wheezing. -     amLODipine (NORVASC) 5 MG tablet; Take 1 tablet (5 mg total) by mouth daily. -     gabapentin (NEURONTIN) 100 MG capsule; Take 1 capsule (100 mg total) by mouth 2 (two) times daily. -     lansoprazole (PREVACID) 30 MG capsule; Take 1 capsule (30 mg total) by mouth daily. -     irbesartan (AVAPRO) 300 MG tablet; Take 1 tablet (300 mg total) by mouth daily. -     montelukast (SINGULAIR) 10 MG tablet; Take 1 tablet (10 mg total) by mouth daily.   Asthma: Controlled with Advair and Singulair no changes to medication regimen Essential hypertension: Uncontrolled, no changes to his current medication regimen I believe strongly that he could help lower his diastolic pressure and become controlled if he cuts out salt in his diet and begins  exercising Hyperlipidemia: have asked him to return in about 3 months for complete physical exam so we can check a lipid panel. It is 10 year AHA risk as above 7.5% I will offer him little low Gout: currently controlled with as needed colchicine use if he begins to have 3 or more outbreaks a year I will offer him allopurinol.  Retinal scar: Encouraged to keep follow-up appointments with ophthalmology, will refill eplerenone with recent potassium being normal. Anxiety: Controlled with Xanax, no suspicious behaviors Left knee pain: Encouraged to get an MRI due to suspicion for meniscal tear. He politely declines and would like to wait until pain returns  spinal stenosis: Controlled with gabapentin Elevated LFTs: Encouraged to reduce alcohol intake as much as possible Rosacea: Controlled no need for intervention at this time  60 minutes spent face-to-face during visit today of which at least 50% was counseling or coordinating care regarding: 1. Asthma, mild persistent, uncomplicated   2. Essential hypertension   3. Hyperlipidemia   4. Gout without tophus, unspecified cause, unspecified chronicity, unspecified site   5. Retinal scar, right   6. Generalized anxiety disorder   7. Left knee pain   8. Spinal stenosis of lumbar region   9. Elevated LFTs   10. Rosacea      Return in about 3 months (around 07/27/2015) for CPE.

## 2015-05-05 NOTE — Addendum Note (Signed)
Addended by: Collie Siad on: 05/05/2015 04:03 PM   Modules accepted: Orders

## 2015-05-11 ENCOUNTER — Encounter: Payer: Self-pay | Admitting: Family Medicine

## 2015-06-03 ENCOUNTER — Telehealth: Payer: Self-pay | Admitting: Family Medicine

## 2015-06-03 ENCOUNTER — Other Ambulatory Visit: Payer: Self-pay

## 2015-06-03 MED ORDER — FLUTICASONE PROPIONATE 50 MCG/ACT NA SUSP: 2.0000 | Freq: Every day | NASAL | Status: DC

## 2015-06-03 NOTE — Telephone Encounter (Signed)
Pt called. He would like refill on his Flonase 62mcg-not on his med list because he forgot to mention it to you.  He uses [facility] and their telephone number is [phone]

## 2015-06-03 NOTE — Telephone Encounter (Signed)
Rx sent to pharmacy   

## 2015-06-07 ENCOUNTER — Ambulatory Visit (INDEPENDENT_AMBULATORY_CARE_PROVIDER_SITE_OTHER): Payer: BLUE CROSS/BLUE SHIELD | Admitting: Family Medicine

## 2015-06-07 ENCOUNTER — Encounter: Payer: Self-pay | Admitting: Family Medicine

## 2015-06-07 VITALS — BP 144/86 | HR 80 | Wt 190.0 lb

## 2015-06-07 DIAGNOSIS — M109 Gout, unspecified: Secondary | ICD-10-CM | POA: Diagnosis not present

## 2015-06-07 DIAGNOSIS — R109 Unspecified abdominal pain: Secondary | ICD-10-CM | POA: Diagnosis not present

## 2015-06-07 NOTE — Progress Notes (Signed)
CC: Gabriel Wong is a 50 y.o. male is here for Gout   Subjective: HPI:    3 days ago he began to right flank pain. Is localized in the right flank and radiates nowhere. It's worse with any twisting motion. It's absent if he sits still. He denies any genitourinary complaints such as hematuria, dysuria, or urinary urgency. Denies nausea or vomiting. It slowly improving without any intervention. He believes it happened soon after riding a motorcycle for almost 3 hours straight after not riding at all throughout the winter. Denies any midline pain or groin pain  He's had a flareup of gout in his right toe. It came on after he  Enjoyed numerous beverages over the weekend and ate fried mushrooms which is also a known trigger. It slowly been improving with taking colchicine twice a day. He's also been using ibuprofen 200 mg 3 times a day and is curious to know if it safe to take more than this or if it will harm his liver. Denies joint pain elsewhere   Review Of Systems Outlined In HPI  Past Medical History  Diagnosis Date  . Rosacea   . Anxiety   . Insomnia   . Hypertension   . Hyperlipemia   . Asthma   . Seasonal allergies   . Alcoholic hepatitis   . Gout 04/28/2015    Past Surgical History  Procedure Laterality Date  . Back surgery    . Fissurectomy     No family history on file.  Social History   Social History  . Marital Status: Single    Spouse Name: N/A  . Number of Children: N/A  . Years of Education: N/A   Occupational History  . Not on file.   Social History Main Topics  . Smoking status: Current Every Day Smoker -- 0.25 packs/day for 25 years    Types: Cigarettes, Cigars  . Smokeless tobacco: Never Used  . Alcohol Use: Yes  . Drug Use: No  . Sexual Activity: Not on file   Other Topics Concern  . Not on file   Social History Narrative     Objective: BP 144/86 mmHg  Pulse 80  Wt 190 lb (86.183 kg)  Vital signs reviewed. General: Alert and Oriented,  No Acute Distress HEENT: Pupils equal, round, reactive to light. Conjunctivae clear.  External ears unremarkable.  Moist mucous membranes. Lungs: Clear and comfortable work of breathing, speaking in full sentences without accessory muscle use. Cardiac: Regular rate and rhythm.  Neuro: CN II-XII grossly intact, gait normal. Full range of motion and strength in the thoracic lumbar spine. No CVA tenderness. Extremities: No peripheral edema.  Strong peripheral pulses. Right great toe is normal without redness, swelling or warmth. Mental Status: No depression, anxiety, nor agitation. Logical though process. Skin: Warm and dry.   Assessment & Plan: Havish was seen today for gout.  Diagnoses and all orders for this visit:  Gout without tophus, unspecified cause, unspecified chronicity, unspecified site  Right flank pain   Gout: Recent flare adequately treated with colchicine, discussed he can increase his ibuprofen to 400 mg 3 times a day if needed which should not pose a threat to his liver. Right flank pain: High suspicion for muscle skeletal strain, low suspicion for kidney stone. I've offered to collect a urinalysis to look for blood however he politely declines.   Return if symptoms worsen or fail to improve.

## 2015-06-29 ENCOUNTER — Telehealth: Payer: Self-pay | Admitting: Family Medicine

## 2015-06-29 ENCOUNTER — Other Ambulatory Visit: Payer: Self-pay | Admitting: Family Medicine

## 2015-06-29 MED ORDER — GABAPENTIN 100 MG PO CAPS: 100.0000 mg | ORAL_CAPSULE | Freq: Four times a day (QID) | ORAL | Status: DC | PRN

## 2015-06-29 NOTE — Telephone Encounter (Signed)
Rx has been updated with his pharmacy

## 2015-06-29 NOTE — Telephone Encounter (Signed)
Please advise 

## 2015-06-29 NOTE — Telephone Encounter (Signed)
Patient called adv that he is out of Gabapentin which was called in as a supply. He is requesting to have a dosage increase because he is needing to take more than what was prescribed taking like 3 or 4 pills a day which is really helping. He said he knows it may be early to fill but he is almost out and adv that his pharmacy is sending over a request for Ventolin he has no refills at all. Thanks

## 2015-07-26 ENCOUNTER — Other Ambulatory Visit: Payer: Self-pay | Admitting: Family Medicine

## 2015-07-28 ENCOUNTER — Ambulatory Visit (INDEPENDENT_AMBULATORY_CARE_PROVIDER_SITE_OTHER): Payer: BLUE CROSS/BLUE SHIELD | Admitting: Family Medicine

## 2015-07-28 ENCOUNTER — Encounter: Payer: Self-pay | Admitting: Family Medicine

## 2015-07-28 VITALS — BP 143/90 | HR 71 | Wt 188.0 lb

## 2015-07-28 DIAGNOSIS — Z0000 Encounter for general adult medical examination without abnormal findings: Secondary | ICD-10-CM | POA: Diagnosis not present

## 2015-07-28 LAB — COMPLETE METABOLIC PANEL WITH GFR
ALBUMIN: 5 g/dL (ref 3.6–5.1)
ALK PHOS: 53 U/L (ref 40–115)
ALT: 59 U/L — AB (ref 9–46)
AST: 54 U/L — ABNORMAL HIGH (ref 10–35)
BILIRUBIN TOTAL: 1 mg/dL (ref 0.2–1.2)
BUN: 12 mg/dL (ref 7–25)
CO2: 25 mmol/L (ref 20–31)
CREATININE: 0.84 mg/dL (ref 0.70–1.33)
Calcium: 9.9 mg/dL (ref 8.6–10.3)
Chloride: 101 mmol/L (ref 98–110)
GFR, Est Non African American: >89 mL/min (ref >=60)
Glucose, Bld: 100 mg/dL — ABNORMAL HIGH (ref 65–99)
Potassium: 3.7 mmol/L (ref 3.5–5.3)
Sodium: 141 mmol/L (ref 135–146)
TOTAL PROTEIN: 7.7 g/dL (ref 6.1–8.1)

## 2015-07-28 LAB — LIPID PANEL
CHOLESTEROL: 256 mg/dL — AB (ref 125–200)
HDL: 38 mg/dL — ABNORMAL LOW (ref >=40)
TRIGLYCERIDES: 803 mg/dL — AB (ref <150)
Total CHOL/HDL Ratio: 6.7 Ratio — ABNORMAL HIGH (ref <=5.0)

## 2015-07-28 LAB — CBC
HCT: 43.5 % (ref 38.5–50.0)
Hemoglobin: 15.2 g/dL (ref 13.2–17.1)
MCH: 33.4 pg — AB (ref 27.0–33.0)
MCHC: 34.9 g/dL (ref 32.0–36.0)
MCV: 95.6 fL (ref 80.0–100.0)
MPV: 10.5 fL (ref 7.5–12.5)
PLATELETS: 196 10*3/uL (ref 140–400)
RBC: 4.55 MIL/uL (ref 4.20–5.80)
RDW: 13.4 % (ref 11.0–15.0)
WBC: 5.1 10*3/uL (ref 3.8–10.8)

## 2015-07-28 MED ORDER — TRIAMCINOLONE ACETONIDE 0.1 % EX CREA
TOPICAL_CREAM | CUTANEOUS | Status: DC
Start: 2015-07-28 — End: 2016-04-25

## 2015-07-28 NOTE — Progress Notes (Signed)
CC: Gabriel Wong is a 50 y.o. male is here for Annual Exam   Subjective: HPI:  Colonoscopy: Refuses colonoscopy, agrees to cologuard Prostate: Discussed screening risks/beneifts with patient today, obtaining psa  Influenza Vaccine: out of season  Pneumovax: no indication Td/Tdap: he believes he's utd Zoster: (Start 50 yo)  Requesting complete physical exam with his only complaint being some mild dermatitis on the distal right finger near the cuticle.   Review of Systems - General ROS: negative for - chills, fever, night sweats, weight gain or weight loss Ophthalmic ROS: negative for - decreased vision Psychological ROS: negative for - anxiety or depression ENT ROS: negative for - hearing change, nasal congestion, tinnitus or allergies Hematological and Lymphatic ROS: negative for - bleeding problems, bruising or swollen lymph nodes Breast ROS: negative Respiratory ROS: no cough, shortness of breath, or wheezing Cardiovascular ROS: no chest pain or dyspnea on exertion Gastrointestinal ROS: no abdominal pain, change in bowel habits, or black or bloody stools Genito-Urinary ROS: negative for - genital discharge, genital ulcers, incontinence or abnormal bleeding from genitals Musculoskeletal ROS: negative for - joint pain or muscle pain Neurological ROS: negative for - headaches or memory loss Dermatological ROS: negative for lumps, mole changes, rash and skin lesion changes other than that described above  Past Medical History  Diagnosis Date  . Rosacea   . Anxiety   . Insomnia   . Hypertension   . Hyperlipemia   . Asthma   . Seasonal allergies   . Alcoholic hepatitis   . Gout 04/28/2015    Past Surgical History  Procedure Laterality Date  . Back surgery    . Fissurectomy     No family history on file.  Social History   Social History  . Marital Status: Single    Spouse Name: N/A  . Number of Children: N/A  . Years of Education: N/A   Occupational History  .  Not on file.   Social History Main Topics  . Smoking status: Current Every Day Smoker -- 0.25 packs/day for 25 years    Types: Cigarettes, Cigars  . Smokeless tobacco: Never Used  . Alcohol Use: Yes  . Drug Use: No  . Sexual Activity: Not on file   Other Topics Concern  . Not on file   Social History Narrative     Objective: BP 143/90 mmHg  Pulse 71  Wt 188 lb (85.276 kg)  General: No Acute Distress HEENT: Atraumatic, normocephalic, conjunctivae normal without scleral icterus.  No nasal discharge, hearing grossly intact, TMs with good landmarks bilaterally with no middle ear abnormalities, posterior pharynx clear without oral lesions. Neck: Supple, trachea midline, no cervical nor supraclavicular adenopathy. Pulmonary: Clear to auscultation bilaterally without wheezing, rhonchi, nor rales. Cardiac: Regular rate and rhythm.  No murmurs, rubs, nor gallops. No peripheral edema.  2+ peripheral pulses bilaterally. Abdomen: Bowel sounds normal.  No masses.  Non-tender without rebound.  Negative Murphy's sign. MSK: Grossly intact, no signs of weakness.  Full strength throughout upper and lower extremities.  Full ROM in upper and lower extremities.  No midline spinal tenderness. Neuro: Gait unremarkable, CN II-XII grossly intact.  C5-C6 Reflex 2/4 Bilaterally, L4 Reflex 2/4 Bilaterally.  Cerebellar function intact. Skin: No rashes other than mild eczematous changes near the cuticle on the right middle finger Psych: Alert and oriented to person/place/time.  Thought process normal. No anxiety/depression. Assessment & Plan: Gabriel Wong was seen today for annual exam.  Diagnoses and all orders for this visit:  Annual  physical exam -     Lipid panel -     COMPLETE METABOLIC PANEL WITH GFR -     CBC -     PSA  Other orders -     triamcinolone cream (KENALOG) 0.1 %; Apply to affected areas twice a day for up to two weeks, avoid face.   Healthy lifestyle interventions including but not  limited to regular exercise, a healthy low fat diet, moderation of salt intake, the dangers of tobacco/alcohol/recreational drug use, nutrition supplementation, and accident avoidance were discussed with the patient and a handout was provided for future reference.  Return if symptoms worsen or fail to improve.

## 2015-07-29 LAB — PSA: PSA: 1.34 ng/mL (ref <=4.00)

## 2015-08-01 ENCOUNTER — Telehealth: Payer: Self-pay | Admitting: Family Medicine

## 2015-08-01 DIAGNOSIS — E785 Hyperlipidemia, unspecified: Secondary | ICD-10-CM

## 2015-08-01 MED ORDER — FISH OIL 1000 MG PO CAPS: ORAL_CAPSULE | ORAL | Status: DC

## 2015-08-01 NOTE — Telephone Encounter (Signed)
Will you please let patient know that his PSA prostate test and blood cell counts were normal.  His liver enzymes were mildly elevated but stable over the past year.  His triglycerides were significantly elevated which can lead to liver and pancreatic inflammation.  This can be improved with engaging in 30-45 minutes of moderate exercise most days of the week, cutting back on alcohol consumption, and taking a 1000mg  otc fish oil capsule twice a day.  I'd recommend rechecking this in three months.

## 2015-08-01 NOTE — Telephone Encounter (Signed)
Pt.notified

## 2015-08-03 DIAGNOSIS — Z1211 Encounter for screening for malignant neoplasm of colon: Secondary | ICD-10-CM | POA: Diagnosis not present

## 2015-08-03 DIAGNOSIS — Z1212 Encounter for screening for malignant neoplasm of rectum: Secondary | ICD-10-CM | POA: Diagnosis not present

## 2015-08-16 LAB — COLOGUARD: Cologuard: NEGATIVE

## 2015-08-17 ENCOUNTER — Telehealth: Payer: Self-pay | Admitting: Family Medicine

## 2015-08-17 NOTE — Telephone Encounter (Signed)
Pt.notified

## 2015-08-17 NOTE — Telephone Encounter (Signed)
Will you please let patient know that his cologuard test was normal and I'd recommend rechecking this in three years.

## 2015-08-18 ENCOUNTER — Encounter: Payer: Self-pay | Admitting: Family Medicine

## 2015-08-24 ENCOUNTER — Other Ambulatory Visit: Payer: Self-pay | Admitting: Family Medicine

## 2015-09-02 ENCOUNTER — Ambulatory Visit (INDEPENDENT_AMBULATORY_CARE_PROVIDER_SITE_OTHER): Payer: BLUE CROSS/BLUE SHIELD

## 2015-09-02 ENCOUNTER — Ambulatory Visit (INDEPENDENT_AMBULATORY_CARE_PROVIDER_SITE_OTHER): Payer: BLUE CROSS/BLUE SHIELD | Admitting: Sports Medicine

## 2015-09-02 ENCOUNTER — Encounter: Payer: Self-pay | Admitting: Sports Medicine

## 2015-09-02 DIAGNOSIS — M109 Gout, unspecified: Secondary | ICD-10-CM

## 2015-09-02 DIAGNOSIS — M79672 Pain in left foot: Secondary | ICD-10-CM

## 2015-09-02 MED ORDER — ALLOPURINOL 300 MG PO TABS: 300.0000 mg | ORAL_TABLET | Freq: Every day | ORAL | Status: DC

## 2015-09-02 NOTE — Assessment & Plan Note (Signed)
Checking uric acid levels, injection of the intertarsal joints, left foot, in which she is having a flare. Return to see me in one month. Starting allopurinol 300 mg daily.

## 2015-09-02 NOTE — Progress Notes (Signed)
   Subjective:    I'm seeing this patient as a consultation for:  Dr. Laren BoomSean Hommel  CC: Left foot swelling and pain  HPI: This is a pleasant 50 year old male, overnight he will cut with severe pain and swelling over his left foot, dorsal midfoot. No known trauma, he does have a history of gout. No fevers or chills. Pain is severe, persistent.  Past medical history, Surgical history, Family history not pertinant except as noted below, Social history, Allergies, and medications have been entered into the medical record, reviewed, and no changes needed.   Review of Systems: No headache, visual changes, nausea, vomiting, diarrhea, constipation, dizziness, abdominal pain, skin rash, fevers, chills, night sweats, weight loss, swollen lymph nodes, body aches, joint swelling, muscle aches, chest pain, shortness of breath, mood changes, visual or auditory hallucinations.   Objective:   General: Well Developed, well nourished, and in no acute distress.  Neuro/Psych: Alert and oriented x3, extra-ocular muscles intact, able to move all 4 extremities, sensation grossly intact. Skin: Warm and dry, no rashes noted.  Respiratory: Not using accessory muscles, speaking in full sentences, trachea midline.  Cardiovascular: Pulses palpable, no extremity edema. Abdomen: Does not appear distended. Left Foot: Swelling, erythema over the dorsal midfoot. Range of motion is full in all directions. Strength is 5/5 in all directions. No hallux valgus. No pes cavus or pes planus. No abnormal callus noted. No pain over the navicular prominence, or base of fifth metatarsal. No tenderness to palpation of the calcaneal insertion of plantar fascia. No pain at the Achilles insertion. No pain over the calcaneal bursa. No pain of the retrocalcaneal bursa. No tenderness to palpation over the tarsals, metatarsals, or phalanges. No hallux rigidus or limitus. No tenderness palpation over interphalangeal joints. No pain  with compression of the metatarsal heads. Neurovascularly intact distally.  Procedure: Real-time Ultrasound Guided Injection of left fourth metatarsal/cuboid joint Device: GE Logiq E  Verbal informed consent obtained.  Time-out conducted.  Noted no overlying erythema, induration, or other signs of local infection.  Skin prepped in a sterile fashion.  Local anesthesia: Topical Ethyl chloride.  With sterile technique and under real time ultrasound guidance:  25-gauge needle advanced into the joint and 1 mL kenalog 40, 1 mL lidocaine injected easily. Completed without difficulty  Advised to call if fevers/chills, erythema, induration, drainage, or persistent bleeding.  Images permanently stored and available for review in the ultrasound unit.  Impression: Technically successful ultrasound guided injection.  Impression and Recommendations:   This case required medical decision making of moderate complexity.

## 2015-09-03 LAB — COMPREHENSIVE METABOLIC PANEL
ALT: 70 U/L — ABNORMAL HIGH (ref 9–46)
Alkaline Phosphatase: 53 U/L (ref 40–115)
BUN: 11 mg/dL (ref 7–25)
Chloride: 103 mmol/L (ref 98–110)
Glucose, Bld: 127 mg/dL — ABNORMAL HIGH (ref 65–99)
Potassium: 3.6 mmol/L (ref 3.5–5.3)
Sodium: 142 mmol/L (ref 135–146)

## 2015-09-03 LAB — COMPREHENSIVE METABOLIC PANEL WITH GFR
AST: 45 U/L — ABNORMAL HIGH (ref 10–35)
Albumin: 4.7 g/dL (ref 3.6–5.1)
CO2: 30 mmol/L (ref 20–31)
Calcium: 10.1 mg/dL (ref 8.6–10.3)
Creat: 0.83 mg/dL (ref 0.70–1.33)
Total Bilirubin: 0.7 mg/dL (ref 0.2–1.2)
Total Protein: 7.5 g/dL (ref 6.1–8.1)

## 2015-09-03 LAB — URIC ACID: Uric Acid, Serum: 9.8 mg/dL — ABNORMAL HIGH (ref 4.0–7.8)

## 2015-09-27 ENCOUNTER — Other Ambulatory Visit: Payer: Self-pay | Admitting: Family Medicine

## 2015-09-29 ENCOUNTER — Other Ambulatory Visit: Payer: Self-pay | Admitting: Family Medicine

## 2015-10-04 ENCOUNTER — Ambulatory Visit: Payer: BLUE CROSS/BLUE SHIELD | Admitting: Sports Medicine

## 2015-10-04 DIAGNOSIS — M542 Cervicalgia: Secondary | ICD-10-CM | POA: Diagnosis not present

## 2015-10-04 DIAGNOSIS — M5441 Lumbago with sciatica, right side: Secondary | ICD-10-CM | POA: Diagnosis not present

## 2015-10-04 DIAGNOSIS — M546 Pain in thoracic spine: Secondary | ICD-10-CM | POA: Diagnosis not present

## 2015-10-10 ENCOUNTER — Ambulatory Visit (INDEPENDENT_AMBULATORY_CARE_PROVIDER_SITE_OTHER): Payer: BLUE CROSS/BLUE SHIELD | Admitting: Sports Medicine

## 2015-10-10 ENCOUNTER — Encounter: Payer: Self-pay | Admitting: Sports Medicine

## 2015-10-10 DIAGNOSIS — M109 Gout, unspecified: Secondary | ICD-10-CM

## 2015-10-10 DIAGNOSIS — R7989 Other specified abnormal findings of blood chemistry: Secondary | ICD-10-CM | POA: Diagnosis not present

## 2015-10-10 DIAGNOSIS — R945 Abnormal results of liver function studies: Principal | ICD-10-CM

## 2015-10-10 NOTE — Assessment & Plan Note (Signed)
Doing well on allopurinol, all joints are feeling better, has had 2 low-grade flares that resolved with colchicine. Rechecking uric acid levels.

## 2015-10-10 NOTE — Assessment & Plan Note (Addendum)
Rechecking LFTs, CK,Toradol 30 intramuscular. Previous abdominal ultrasounds have shown hepatic steatosis Having widespread myalgias, likely viral. If persistently elevated LFTs I would recommend repeat abdominal ultrasound.

## 2015-10-10 NOTE — Progress Notes (Signed)
  Subjective:    CC: Follow-up  HPI: Gout: Injected the intertarsal joints the last visit, uric acid was mid nines, overall did extremely well, we did start allopurinol. He notes overall joint discomfort has improved, he has had a few low-grade flares that are really know or near what he had before. Happy with how things are going.  Transaminitis: History of hepatic steatosis on ultrasound back in 2007, recent LFT tests have shown only minimal elevation in liver function. He is having some widespread myalgias, and is agreeable to proceed with another set of LFTs.  Past medical history, Surgical history, Family history not pertinant except as noted below, Social history, Allergies, and medications have been entered into the medical record, reviewed, and no changes needed.   Review of Systems: No fevers, chills, night sweats, weight loss, chest pain, or shortness of breath.   Objective:    General: Well Developed, well nourished, and in no acute distress.  Neuro: Alert and oriented x3, extra-ocular muscles intact, sensation grossly intact.  HEENT: Normocephalic, atraumatic, pupils equal round reactive to light, neck supple, no masses, no lymphadenopathy, thyroid nonpalpable.  Skin: Warm and dry, no rashes. Cardiac: Regular rate and rhythm, no murmurs rubs or gallops, no lower extremity edema.  Respiratory: Clear to auscultation bilaterally. Not using accessory muscles, speaking in full sentences.  Impression and Recommendations:    I spent 25 minutes with this patient, greater than 50% was face-to-face time counseling regarding the above diagnoses

## 2015-10-11 ENCOUNTER — Ambulatory Visit: Payer: BLUE CROSS/BLUE SHIELD | Admitting: Sports Medicine

## 2015-10-11 LAB — CBC WITH DIFFERENTIAL/PLATELET
Basophils Absolute: 0 {cells}/uL (ref 0–200)
Basophils Relative: 0 %
Eosinophils Absolute: 45 cells/uL (ref 15–500)
Eosinophils Relative: 1 %
HCT: 40.2 % (ref 38.5–50.0)
Hemoglobin: 13.9 g/dL (ref 13.2–17.1)
Lymphocytes Relative: 18 %
Lymphs Abs: 810 {cells}/uL — ABNORMAL LOW (ref 850–3900)
MCH: 32.9 pg (ref 27.0–33.0)
MCHC: 34.6 g/dL (ref 32.0–36.0)
MCV: 95.3 fL (ref 80.0–100.0)
MPV: 10.9 fL (ref 7.5–12.5)
Monocytes Absolute: 495 {cells}/uL (ref 200–950)
Monocytes Relative: 11 %
Neutro Abs: 3150 {cells}/uL (ref 1500–7800)
Neutrophils Relative %: 70 %
Platelets: 203 K/uL (ref 140–400)
RBC: 4.22 MIL/uL (ref 4.20–5.80)
RDW: 14 % (ref 11.0–15.0)
WBC: 4.5 10*3/uL (ref 3.8–10.8)

## 2015-10-11 LAB — COMPREHENSIVE METABOLIC PANEL
BUN: 13 mg/dL (ref 7–25)
CO2: 25 mmol/L (ref 20–31)
Chloride: 107 mmol/L (ref 98–110)
Creat: 0.84 mg/dL (ref 0.70–1.33)
Glucose, Bld: 108 mg/dL — ABNORMAL HIGH (ref 65–99)
Potassium: 3.6 mmol/L (ref 3.5–5.3)
Sodium: 143 mmol/L (ref 135–146)

## 2015-10-11 LAB — COMPREHENSIVE METABOLIC PANEL WITH GFR
ALT: 78 U/L — ABNORMAL HIGH (ref 9–46)
AST: 49 U/L — ABNORMAL HIGH (ref 10–35)
Albumin: 4.4 g/dL (ref 3.6–5.1)
Alkaline Phosphatase: 52 U/L (ref 40–115)
Calcium: 9.5 mg/dL (ref 8.6–10.3)
Total Bilirubin: 0.8 mg/dL (ref 0.2–1.2)
Total Protein: 6.8 g/dL (ref 6.1–8.1)

## 2015-10-11 LAB — CK: Total CK: 216 U/L (ref 7–232)

## 2015-10-11 LAB — URIC ACID: Uric Acid, Serum: 4.6 mg/dL (ref 4.0–8.0)

## 2015-10-11 MED ORDER — KETOROLAC TROMETHAMINE 30 MG/ML IJ SOLN
30.0000 mg | Freq: Once | INTRAMUSCULAR | Status: AC
  Administered 2015-10-10: 30 mg via INTRAMUSCULAR

## 2015-10-11 NOTE — Addendum Note (Signed)
Addended by: Baird KayUGLAS, Enoc Getter M on: 10/11/2015 09:03 AM   Modules accepted: Orders

## 2015-10-11 NOTE — Addendum Note (Signed)
Addended by: Monica BectonHEKKEKANDAM, Roe Wilner J on: 10/11/2015 08:24 AM   Modules accepted: Orders

## 2015-10-12 ENCOUNTER — Other Ambulatory Visit: Payer: Self-pay | Admitting: Family Medicine

## 2015-10-12 DIAGNOSIS — E785 Hyperlipidemia, unspecified: Secondary | ICD-10-CM

## 2015-10-25 ENCOUNTER — Ambulatory Visit (INDEPENDENT_AMBULATORY_CARE_PROVIDER_SITE_OTHER): Payer: BLUE CROSS/BLUE SHIELD

## 2015-10-25 DIAGNOSIS — K760 Fatty (change of) liver, not elsewhere classified: Secondary | ICD-10-CM

## 2015-10-25 DIAGNOSIS — R7989 Other specified abnormal findings of blood chemistry: Secondary | ICD-10-CM

## 2015-10-25 DIAGNOSIS — R945 Abnormal results of liver function studies: Principal | ICD-10-CM

## 2015-10-25 DIAGNOSIS — E785 Hyperlipidemia, unspecified: Secondary | ICD-10-CM | POA: Diagnosis not present

## 2015-10-25 LAB — LIPID PANEL
Cholesterol: 218 mg/dL — ABNORMAL HIGH (ref 125–200)
HDL: 35 mg/dL — AB (ref >=40)
TRIGLYCERIDES: 541 mg/dL — AB (ref <150)
Total CHOL/HDL Ratio: 6.2 Ratio — ABNORMAL HIGH (ref <=5.0)

## 2015-10-26 ENCOUNTER — Other Ambulatory Visit: Payer: Self-pay | Admitting: Family Medicine

## 2015-10-26 ENCOUNTER — Telehealth: Payer: Self-pay | Admitting: Family Medicine

## 2015-10-26 DIAGNOSIS — E781 Pure hyperglyceridemia: Secondary | ICD-10-CM

## 2015-10-26 MED ORDER — ICOSAPENT ETHYL 1 G PO CAPS: 2.0000 | ORAL_CAPSULE | Freq: Two times a day (BID) | ORAL | Status: DC

## 2015-10-26 NOTE — Telephone Encounter (Signed)
A lot of times the explanation is on the lab report.  Under the LDL section it says: Not calculated due to Triglyceride >400.  (It also mentions a direct LDL but I rarely order this unless triglycerides are impossible to control)

## 2015-10-26 NOTE — Telephone Encounter (Signed)
Pt wants to know why his LDL wasn't calculated? Please advise

## 2015-10-26 NOTE — Telephone Encounter (Signed)
Will you please let patient know that his triglyceride level remains elevated at 541 and does not seem to have responded to fish oral capsules. I recommend he stop taking fish oil and switch to a pharmaceutical grade omega acid supplement that [facility] [place] [place] [place] [facility] [place]. I recommended he pick up a savings voucher for this before he comes over to the pharmacy. (Rx Vasepa)

## 2015-10-26 NOTE — Telephone Encounter (Signed)
Pt.notified

## 2015-10-27 ENCOUNTER — Other Ambulatory Visit: Payer: Self-pay

## 2015-10-27 ENCOUNTER — Other Ambulatory Visit: Payer: Self-pay | Admitting: Family Medicine

## 2015-10-27 MED ORDER — ALPRAZOLAM 0.25 MG PO TABS: 0.2500 mg | ORAL_TABLET | Freq: Two times a day (BID) | ORAL | Status: DC | PRN

## 2015-11-11 ENCOUNTER — Encounter: Payer: Self-pay | Admitting: Sports Medicine

## 2015-11-11 ENCOUNTER — Ambulatory Visit (INDEPENDENT_AMBULATORY_CARE_PROVIDER_SITE_OTHER): Payer: BLUE CROSS/BLUE SHIELD | Admitting: Sports Medicine

## 2015-11-11 DIAGNOSIS — M109 Gout, unspecified: Secondary | ICD-10-CM

## 2015-11-11 NOTE — Assessment & Plan Note (Signed)
Was initially doing well on allopurinol, all joints were feeling better, he is having a flare in the intertarsal joints today, this was injected. Return to see me in one month.

## 2015-11-11 NOTE — Progress Notes (Signed)
  Subjective:    CC: Right foot pain  HPI: This is a pleasant 50 year old male with gout, he comes in with a new onset pain, swelling, redness on the medial aspect of his right foot, severe, persistent. She continues with his allopurinol, unfortunately colchicine as well has been ineffective at controlling his pain.  Past medical history, Surgical history, Family history not pertinant except as noted below, Social history, Allergies, and medications have been entered into the medical record, reviewed, and no changes needed.   Review of Systems: No fevers, chills, night sweats, weight loss, chest pain, or shortness of breath.   Objective:    General: Well Developed, well nourished, and in no acute distress.  Neuro: Alert and oriented x3, extra-ocular muscles intact, sensation grossly intact.  HEENT: Normocephalic, atraumatic, pupils equal round reactive to light, neck supple, no masses, no lymphadenopathy, thyroid nonpalpable.  Skin: Warm and dry, no rashes. Cardiac: Regular rate and rhythm, no murmurs rubs or gallops, no lower extremity edema.  Respiratory: Clear to auscultation bilaterally. Not using accessory muscles, speaking in full sentences. Right foot: Swollen, red medially, tenderness at the talonavicular and the navicular/medial cuneiform joints  Procedure: Real-time Ultrasound Guided Injection of right medial intertarsal joints Device: GE Logiq E  Verbal informed consent obtained.  Time-out conducted.  Noted no overlying erythema, induration, or other signs of local infection.  Skin prepped in a sterile fashion.  Local anesthesia: Topical Ethyl chloride.  With sterile technique and under real time ultrasound guidance:  25-gauge needle advanced between the talus and navicular, 1 mL kenalog 40, 1 mL lidocaine injected easily. Completed without difficulty  Pain immediately resolved suggesting accurate placement of the medication.  Advised to call if fevers/chills, erythema,  induration, drainage, or persistent bleeding.  Images permanently stored and available for review in the ultrasound unit.  Impression: Technically successful ultrasound guided injection.  Impression and Recommendations:    Gout Was initially doing well on allopurinol, all joints were feeling better, he is having a flare in the intertarsal joints today, this was injected. Return to see me in one month.

## 2015-11-25 ENCOUNTER — Other Ambulatory Visit: Payer: Self-pay | Admitting: Family Medicine

## 2015-11-28 ENCOUNTER — Other Ambulatory Visit: Payer: Self-pay | Admitting: Family Medicine

## 2015-11-30 ENCOUNTER — Other Ambulatory Visit: Payer: Self-pay | Admitting: Sports Medicine

## 2015-12-12 ENCOUNTER — Ambulatory Visit (INDEPENDENT_AMBULATORY_CARE_PROVIDER_SITE_OTHER): Payer: BLUE CROSS/BLUE SHIELD | Admitting: Sports Medicine

## 2015-12-12 ENCOUNTER — Encounter: Payer: Self-pay | Admitting: Sports Medicine

## 2015-12-12 DIAGNOSIS — J4530 Mild persistent asthma, uncomplicated: Secondary | ICD-10-CM

## 2015-12-12 MED ORDER — DEXAMETHASONE 4 MG PO TABS: 4.0000 mg | ORAL_TABLET | Freq: Two times a day (BID) | ORAL | 0 refills | Status: DC

## 2015-12-12 NOTE — Progress Notes (Signed)
  Subjective:    CC: sore throat  HPI: 50 yo with history of HTN, asthma presenting with one week of sore throat and bilateral ear pressure.  Patient says these symptoms feel a lot like seasonal allergies that he gets every year.  Sore throat is worse at night.  He has been taking loratadine daily since symptoms began, without relief.  He says normally a 6 day course of loratadine will relieve his symptoms.  He has started taking cetirizine today to see if that helps more than loratadine.  He has also been using Flonase PRN when he feels like he is getting a sinus headache.  Patient's symptoms have not been bad enough to miss work or affect his daily activities.  He is just concerned this may be a sinus infection because his allergy symptoms usually clear up within a week of starting loratadine.  Denies cough, SOB, runny nose, fevers.      Past medical history, Surgical history, Family history not pertinant except as noted below, Social history, Allergies, and medications have been entered into the medical record, reviewed, and no changes needed.   Review of Systems: No fevers, chills, night sweats, weight loss, chest pain, or shortness of breath.   Objective:    General: Well Developed, well nourished, and in no acute distress.  Neuro: Alert and oriented x3, extra-ocular muscles intact, sensation grossly intact.  HEENT: Normocephalic, atraumatic, pupils equal round reactive to light. L eye conjunctival injections.  Mild periorbital swelling around L eye. Erythematous nasal turbinates.  Mild tonsillar erythema.  Neck supple, no masses, no lymphadenopathy, thyroid nonpalpable. No TTP over sinuses.  Normal TMs bilaterally.  Skin: Warm and dry, no rashes. Cardiac: Regular rate and rhythm, no murmurs rubs or gallops, no lower extremity edema.  Respiratory: Clear to auscultation bilaterally. Not using accessory muscles, speaking in full sentences.   Impression and Recommendations:    1. Allergic  rhinitis  -Prescribed 5 day course of dexamethasone  -Continue cetirizine, Singulair -Flonase daily  -If sore throat has not resolved in one month, follow-up for ENT referral to rule out throat cancer, given smoking history

## 2015-12-12 NOTE — Assessment & Plan Note (Signed)
Currently having some allergic rhinitis type symptoms. Adding dexamethasone, restart Flonase, and tinea Singulair, cetirizine. We will add cromolyn if insufficient response, however considering he does have a chronic sore throat if insufficient relief in about 6 weeks we will proceed with ENT referral for direct visualization looking for a mass. He is a smoker.

## 2015-12-13 ENCOUNTER — Other Ambulatory Visit: Payer: Self-pay | Admitting: Sports Medicine

## 2015-12-13 DIAGNOSIS — M546 Pain in thoracic spine: Secondary | ICD-10-CM | POA: Diagnosis not present

## 2015-12-13 DIAGNOSIS — M5441 Lumbago with sciatica, right side: Secondary | ICD-10-CM | POA: Diagnosis not present

## 2015-12-13 DIAGNOSIS — M542 Cervicalgia: Secondary | ICD-10-CM | POA: Diagnosis not present

## 2015-12-20 ENCOUNTER — Telehealth: Payer: Self-pay | Admitting: Sports Medicine

## 2015-12-20 NOTE — Telephone Encounter (Signed)
Pt called. He wants a Z-pak called in because  He believes he has an infection He said he left a vm this morning.

## 2015-12-20 NOTE — Telephone Encounter (Signed)
What kind of infection?

## 2015-12-21 MED ORDER — AZITHROMYCIN 250 MG PO TABS: ORAL_TABLET | ORAL | 0 refills | Status: DC

## 2015-12-21 NOTE — Telephone Encounter (Signed)
Pt.notified

## 2015-12-21 NOTE — Telephone Encounter (Signed)
Pt believes he has a sinus infection and a 5 day Z-pak usually knocks it out.

## 2015-12-21 NOTE — Telephone Encounter (Signed)
Done

## 2015-12-23 ENCOUNTER — Other Ambulatory Visit: Payer: Self-pay | Admitting: Family Medicine

## 2015-12-23 ENCOUNTER — Other Ambulatory Visit: Payer: Self-pay | Admitting: Sports Medicine

## 2015-12-23 DIAGNOSIS — M109 Gout, unspecified: Secondary | ICD-10-CM

## 2016-01-20 ENCOUNTER — Other Ambulatory Visit: Payer: Self-pay | Admitting: Family Medicine

## 2016-01-22 ENCOUNTER — Other Ambulatory Visit: Payer: Self-pay | Admitting: Family Medicine

## 2016-01-22 DIAGNOSIS — E781 Pure hyperglyceridemia: Secondary | ICD-10-CM

## 2016-02-14 DIAGNOSIS — L570 Actinic keratosis: Secondary | ICD-10-CM | POA: Diagnosis not present

## 2016-02-14 DIAGNOSIS — L300 Nummular dermatitis: Secondary | ICD-10-CM | POA: Diagnosis not present

## 2016-02-19 ENCOUNTER — Other Ambulatory Visit: Payer: Self-pay | Admitting: Sports Medicine

## 2016-02-22 ENCOUNTER — Other Ambulatory Visit: Payer: Self-pay | Admitting: *Deleted

## 2016-02-22 MED ORDER — IRBESARTAN 300 MG PO TABS: ORAL_TABLET | ORAL | 0 refills | Status: DC

## 2016-02-22 MED ORDER — COLCHICINE 0.6 MG PO TABS: 0.6000 mg | ORAL_TABLET | Freq: Every day | ORAL | 1 refills | Status: DC | PRN

## 2016-02-23 ENCOUNTER — Other Ambulatory Visit: Payer: Self-pay

## 2016-02-23 MED ORDER — IRBESARTAN 300 MG PO TABS: ORAL_TABLET | ORAL | 0 refills | Status: DC

## 2016-03-21 ENCOUNTER — Other Ambulatory Visit: Payer: Self-pay | Admitting: Sports Medicine

## 2016-03-22 ENCOUNTER — Telehealth: Payer: Self-pay

## 2016-03-22 ENCOUNTER — Other Ambulatory Visit: Payer: Self-pay

## 2016-03-22 MED ORDER — IRBESARTAN 300 MG PO TABS: ORAL_TABLET | ORAL | 0 refills | Status: DC

## 2016-03-22 MED ORDER — EZETIMIBE 10 MG PO TABS: 10.0000 mg | ORAL_TABLET | Freq: Every day | ORAL | 0 refills | Status: DC

## 2016-03-22 NOTE — Telephone Encounter (Signed)
Error. Melondy Blanchard,CMA  

## 2016-03-22 NOTE — Telephone Encounter (Signed)
Patient request refill on Zetia 10 mg  and Irbesartan 300 mg. A 90 day supply was sent to pharmacy and patient scheduled an appt in April for Annual and medication refills. Gabriel Wong,CMA

## 2016-03-23 ENCOUNTER — Other Ambulatory Visit: Payer: Self-pay | Admitting: *Deleted

## 2016-03-23 MED ORDER — AMLODIPINE BESYLATE 5 MG PO TABS: ORAL_TABLET | ORAL | 0 refills | Status: DC

## 2016-03-23 MED ORDER — ALBUTEROL SULFATE HFA 108 (90 BASE) MCG/ACT IN AERS: INHALATION_SPRAY | RESPIRATORY_TRACT | 1 refills | Status: DC

## 2016-04-03 ENCOUNTER — Other Ambulatory Visit: Payer: Self-pay | Admitting: Sports Medicine

## 2016-04-17 ENCOUNTER — Other Ambulatory Visit: Payer: Self-pay | Admitting: Sports Medicine

## 2016-04-17 DIAGNOSIS — M109 Gout, unspecified: Secondary | ICD-10-CM

## 2016-04-24 ENCOUNTER — Telehealth: Payer: Self-pay | Admitting: Sports Medicine

## 2016-04-24 DIAGNOSIS — B355 Tinea imbricata: Secondary | ICD-10-CM | POA: Diagnosis not present

## 2016-04-24 NOTE — Telephone Encounter (Signed)
Could you call Gabriel Wong b/c he is wanting a Z PACK Rx to be called in, he is traveling to [place] and is wanting it to be phoned in to his pharmacy but he has a respiratory infection. Please call to get in contact with

## 2016-04-24 NOTE — Telephone Encounter (Signed)
Has appointment tomorrow.

## 2016-04-24 NOTE — Telephone Encounter (Signed)
Lets get some more history on this before I go throwing antibiotics at everything.  Routing to [employer].

## 2016-04-25 ENCOUNTER — Ambulatory Visit (INDEPENDENT_AMBULATORY_CARE_PROVIDER_SITE_OTHER): Payer: BLUE CROSS/BLUE SHIELD | Admitting: Sports Medicine

## 2016-04-25 ENCOUNTER — Encounter: Payer: Self-pay | Admitting: Sports Medicine

## 2016-04-25 DIAGNOSIS — R945 Abnormal results of liver function studies: Secondary | ICD-10-CM

## 2016-04-25 DIAGNOSIS — E781 Pure hyperglyceridemia: Secondary | ICD-10-CM

## 2016-04-25 DIAGNOSIS — R7989 Other specified abnormal findings of blood chemistry: Secondary | ICD-10-CM

## 2016-04-25 DIAGNOSIS — I10 Essential (primary) hypertension: Secondary | ICD-10-CM

## 2016-04-25 DIAGNOSIS — H31001 Unspecified chorioretinal scars, right eye: Secondary | ICD-10-CM | POA: Diagnosis not present

## 2016-04-25 DIAGNOSIS — M109 Gout, unspecified: Secondary | ICD-10-CM

## 2016-04-25 DIAGNOSIS — J0100 Acute maxillary sinusitis, unspecified: Secondary | ICD-10-CM | POA: Diagnosis not present

## 2016-04-25 MED ORDER — COLCHICINE 0.6 MG PO TABS: 0.6000 mg | ORAL_TABLET | Freq: Every day | ORAL | 1 refills | Status: DC | PRN

## 2016-04-25 MED ORDER — MONTELUKAST SODIUM 10 MG PO TABS: 10.0000 mg | ORAL_TABLET | Freq: Every day | ORAL | 3 refills | Status: DC

## 2016-04-25 MED ORDER — AZITHROMYCIN 250 MG PO TABS: ORAL_TABLET | ORAL | 0 refills | Status: DC

## 2016-04-25 MED ORDER — PHENYLEPHRINE HCL 10 MG PO TABS
10.0000 mg | ORAL_TABLET | Freq: Three times a day (TID) | ORAL | 0 refills | Status: DC
Start: 2016-04-25 — End: 2016-07-31

## 2016-04-25 MED ORDER — ICOSAPENT ETHYL 1 G PO CAPS: 2.0000 | ORAL_CAPSULE | Freq: Two times a day (BID) | ORAL | 11 refills | Status: DC

## 2016-04-25 MED ORDER — GABAPENTIN 100 MG PO CAPS: 100.0000 mg | ORAL_CAPSULE | Freq: Four times a day (QID) | ORAL | 11 refills | Status: DC | PRN

## 2016-04-25 MED ORDER — FLUTICASONE PROPIONATE 50 MCG/ACT NA SUSP: NASAL | 11 refills | Status: DC

## 2016-04-25 MED ORDER — ALBUTEROL SULFATE HFA 108 (90 BASE) MCG/ACT IN AERS: INHALATION_SPRAY | RESPIRATORY_TRACT | 1 refills | Status: DC

## 2016-04-25 MED ORDER — AMLODIPINE BESYLATE 5 MG PO TABS: 5.0000 mg | ORAL_TABLET | Freq: Every day | ORAL | 3 refills | Status: DC

## 2016-04-25 MED ORDER — EPLERENONE 25 MG PO TABS: 25.0000 mg | ORAL_TABLET | Freq: Every day | ORAL | 3 refills | Status: DC

## 2016-04-25 MED ORDER — ADVAIR DISKUS 100-50 MCG/DOSE IN AEPB: INHALATION_SPRAY | RESPIRATORY_TRACT | 11 refills | Status: DC

## 2016-04-25 MED ORDER — IRBESARTAN 300 MG PO TABS: 300.0000 mg | ORAL_TABLET | Freq: Every day | ORAL | 3 refills | Status: DC

## 2016-04-25 MED ORDER — TRIAMCINOLONE ACETONIDE 0.1 % EX CREA: TOPICAL_CREAM | CUTANEOUS | 0 refills | Status: AC

## 2016-04-25 MED ORDER — LANSOPRAZOLE 30 MG PO CPDR: 30.0000 mg | DELAYED_RELEASE_CAPSULE | Freq: Every day | ORAL | 3 refills | Status: DC

## 2016-04-25 MED ORDER — DEXAMETHASONE 4 MG PO TABS: 4.0000 mg | ORAL_TABLET | Freq: Two times a day (BID) | ORAL | 0 refills | Status: DC

## 2016-04-25 MED ORDER — ALLOPURINOL 300 MG PO TABS: 300.0000 mg | ORAL_TABLET | Freq: Every day | ORAL | 3 refills | Status: DC

## 2016-04-25 NOTE — Assessment & Plan Note (Signed)
We can recheck when his physical comes around, can add fenofibrate if still elevated.

## 2016-04-25 NOTE — Assessment & Plan Note (Signed)
Under moderate control, no changes in medication just yet.

## 2016-04-25 NOTE — Assessment & Plan Note (Signed)
Previous abdominal ultrasounds have shown hepatic steatosis, we are going to repeat his abdominal ultrasound and LFTs at his physical.

## 2016-04-25 NOTE — Assessment & Plan Note (Signed)
Currently taking eplerenone prescribed by his ophthalmologist, his symptoms have resolved so he needs to ask his ophthalmologist.  Needs to continue taking a prior known or if we can stop it.

## 2016-04-25 NOTE — Assessment & Plan Note (Signed)
Azithromycin, Decadron, there is some left eustachian tube dysfunction so adding phenylephrine as well.

## 2016-04-25 NOTE — Progress Notes (Signed)
  Subjective:    CC: feeling sick  HPI: This is a pleasant 51 year old male, comes in with a several day history of increasing pain and pressure behind maxillary sinuses with a full sensation in the left ear, mild cough. Nonproductive, no constitutional symptoms.  Hypertension: Under moderate control.  Gout: Hasn't had a flare in a long period of time.   Retinal scar: Has been on a prior known prescribed by his ophthalmologist, I encouraged him to discuss this with ophthalmology to find out whether he needs to continue taking this.  Past medical history:  Negative.  See flowsheet/record as well for more information.  Surgical history: Negative.  See flowsheet/record as well for more information.  Family history: Negative.  See flowsheet/record as well for more information.  Social history: Negative.  See flowsheet/record as well for more information.  Allergies, and medications have been entered into the medical record, reviewed, and no changes needed.   Review of Systems: No fevers, chills, night sweats, weight loss, chest pain, or shortness of breath.   Objective:    General: Well Developed, well nourished, and in no acute distress.  Neuro: Alert and oriented x3, extra-ocular muscles intact, sensation grossly intact.  HEENT: Normocephalic, atraumatic, pupils equal round reactive to light, neck supple, no masses, no lymphadenopathy, thyroid nonpalpable.  Skin: Warm and dry, no rashes. Cardiac: Regular rate and rhythm, no murmurs rubs or gallops, no lower extremity edema.  Respiratory: Clear to auscultation bilaterally. Not using accessory muscles, speaking in full sentences.   Impression and Recommendations:    Acute maxillary sinusitis Azithromycin, Decadron, there is some left eustachian tube dysfunction so adding phenylephrine as well.  Retinal scar Currently taking eplerenone prescribed by his ophthalmologist, his symptoms have resolved so he needs to ask his  ophthalmologist.  Needs to continue taking a prior known or if we can stop it.  Elevated LFTs Previous abdominal ultrasounds have shown hepatic steatosis, we are going to repeat his abdominal ultrasound and LFTs at his physical.  Essential hypertension Under moderate control, no changes in medication just yet.  Hypertriglyceridemia We can recheck when his physical comes around, can add fenofibrate if still elevated.  I spent 40 minutes with this patient, greater than 50% was face-to-face time counseling regarding the above diagnoses

## 2016-05-21 ENCOUNTER — Other Ambulatory Visit: Payer: Self-pay | Admitting: Sports Medicine

## 2016-05-23 DIAGNOSIS — M542 Cervicalgia: Secondary | ICD-10-CM | POA: Diagnosis not present

## 2016-05-23 DIAGNOSIS — M546 Pain in thoracic spine: Secondary | ICD-10-CM | POA: Diagnosis not present

## 2016-05-23 DIAGNOSIS — M5441 Lumbago with sciatica, right side: Secondary | ICD-10-CM | POA: Diagnosis not present

## 2016-07-17 ENCOUNTER — Telehealth: Payer: Self-pay | Admitting: *Deleted

## 2016-07-17 ENCOUNTER — Other Ambulatory Visit: Payer: Self-pay | Admitting: Sports Medicine

## 2016-07-17 MED ORDER — LANSOPRAZOLE 30 MG PO CPDR: 30.0000 mg | DELAYED_RELEASE_CAPSULE | Freq: Two times a day (BID) | ORAL | 3 refills | Status: DC

## 2016-07-17 NOTE — Telephone Encounter (Signed)
Done

## 2016-07-17 NOTE — Telephone Encounter (Signed)
Received message from the pharmacy that patient would like a new prescription for lansoprozole 30 mg BID instead of qd. He apparently is taking it twice a day instead of once a day as prescribed. If agreeable to you can you please send a new prescription that has a sig of [facility] [place]?

## 2016-07-26 ENCOUNTER — Telehealth: Payer: Self-pay | Admitting: Sports Medicine

## 2016-07-26 NOTE — Telephone Encounter (Signed)
Pt wants to have an STD panel and regular labs done.

## 2016-07-26 NOTE — Telephone Encounter (Signed)
Just have him give me a list of tests he wants done.

## 2016-07-26 NOTE — Telephone Encounter (Signed)
Mr. Gabriel Wong would like to be contacted about his blood work that will be done for his visit next Tuesday. He has some requests on what he would like to be included in the blood work, so he would liked to be contacted about this so that he can converse with you or the nurse.

## 2016-07-26 NOTE — Telephone Encounter (Signed)
I can do that on the day of the visit unless he wants it before.  He should come fasting.

## 2016-07-31 ENCOUNTER — Ambulatory Visit (INDEPENDENT_AMBULATORY_CARE_PROVIDER_SITE_OTHER): Payer: BLUE CROSS/BLUE SHIELD | Admitting: Sports Medicine

## 2016-07-31 DIAGNOSIS — Z0000 Encounter for general adult medical examination without abnormal findings: Secondary | ICD-10-CM

## 2016-07-31 DIAGNOSIS — R945 Abnormal results of liver function studies: Principal | ICD-10-CM

## 2016-07-31 DIAGNOSIS — E781 Pure hyperglyceridemia: Secondary | ICD-10-CM | POA: Diagnosis not present

## 2016-07-31 DIAGNOSIS — M7712 Lateral epicondylitis, left elbow: Secondary | ICD-10-CM

## 2016-07-31 DIAGNOSIS — M7711 Lateral epicondylitis, right elbow: Secondary | ICD-10-CM | POA: Insufficient documentation

## 2016-07-31 DIAGNOSIS — R7989 Other specified abnormal findings of blood chemistry: Secondary | ICD-10-CM | POA: Diagnosis not present

## 2016-07-31 DIAGNOSIS — I10 Essential (primary) hypertension: Secondary | ICD-10-CM

## 2016-07-31 DIAGNOSIS — M1A9XX0 Chronic gout, unspecified, without tophus (tophi): Secondary | ICD-10-CM

## 2016-07-31 LAB — COMPREHENSIVE METABOLIC PANEL
ALT: 93 U/L — ABNORMAL HIGH (ref 9–46)
AST: 63 U/L — ABNORMAL HIGH (ref 10–35)
Alkaline Phosphatase: 58 U/L (ref 40–115)
BUN: 16 mg/dL (ref 7–25)
Creat: 0.77 mg/dL (ref 0.70–1.33)
Glucose, Bld: 99 mg/dL (ref 65–99)
Potassium: 3.7 mmol/L (ref 3.5–5.3)
Total Bilirubin: 1 mg/dL (ref 0.2–1.2)

## 2016-07-31 LAB — CBC
HCT: 43.1 % (ref 38.5–50.0)
Hemoglobin: 15.3 g/dL (ref 13.2–17.1)
MCH: 34.4 pg — ABNORMAL HIGH (ref 27.0–33.0)
MCHC: 35.5 g/dL (ref 32.0–36.0)
MCV: 96.9 fL (ref 80.0–100.0)
MPV: 10.2 fL (ref 7.5–12.5)
Platelets: 256 10*3/uL (ref 140–400)
RBC: 4.45 MIL/uL (ref 4.20–5.80)
RDW: 13.7 % (ref 11.0–15.0)
WBC: 5.5 K/uL (ref 3.8–10.8)

## 2016-07-31 LAB — COMPREHENSIVE METABOLIC PANEL WITH GFR
Albumin: 4.7 g/dL (ref 3.6–5.1)
CO2: 28 mmol/L (ref 20–31)
Calcium: 9.9 mg/dL (ref 8.6–10.3)
Chloride: 105 mmol/L (ref 98–110)
Sodium: 143 mmol/L (ref 135–146)
Total Protein: 7.6 g/dL (ref 6.1–8.1)

## 2016-07-31 LAB — URIC ACID: Uric Acid, Serum: 4.9 mg/dL (ref 4.0–8.0)

## 2016-07-31 LAB — LIPID PANEL W/REFLEX DIRECT LDL
Cholesterol: 226 mg/dL — ABNORMAL HIGH (ref <200)
HDL: 40 mg/dL — ABNORMAL LOW (ref >40)
LDL-Cholesterol: 138 mg/dL — ABNORMAL HIGH
Non-HDL Cholesterol (Calc): 186 mg/dL — ABNORMAL HIGH (ref <130)
Total CHOL/HDL Ratio: 5.7 ratio — ABNORMAL HIGH (ref <5.0)
Triglycerides: 333 mg/dL — ABNORMAL HIGH (ref <150)

## 2016-07-31 LAB — TSH: TSH: 1.62 m[IU]/L (ref 0.40–4.50)

## 2016-07-31 LAB — HIV ANTIBODY (ROUTINE TESTING W REFLEX): HIV 1&2 Ab, 4th Generation: NONREACTIVE

## 2016-07-31 NOTE — Assessment & Plan Note (Signed)
Repeating LFTs and abdominal ultrasound

## 2016-07-31 NOTE — Assessment & Plan Note (Signed)
His elbow while trying arm wrestle his son. Rehabilitation exercises given, return in one month if no better for an injection.

## 2016-07-31 NOTE — Assessment & Plan Note (Signed)
Rechecking routine blood work. 

## 2016-07-31 NOTE — Assessment & Plan Note (Addendum)
Rechecking triglycerides, adding fenofibrate if still elevated. Continue Zetia and Vascepa.  Adding fenofibrate. Recheck in 3 months.

## 2016-07-31 NOTE — Assessment & Plan Note (Signed)
Annual physical as above, routine screening. Up-to-date on colon cancer screening. Declines tetanus vaccination, will do it next year.

## 2016-07-31 NOTE — Progress Notes (Signed)
  Subjective:    CC: Annual physical  HPI:  Blood pressure: Typically runs normal, forgot his medication this morning.  Hypertriglyceridemia: Rechecking.  Hepatic steatosis: Rechecking hepatic ultrasound. LFTs.  Left elbow pain: Was trying arm wrestle his son, now has pain over the lateral epicondyle, moderate, persistent without radiation.  Past medical history:  Negative.  See flowsheet/record as well for more information.  Surgical history: Negative.  See flowsheet/record as well for more information.  Family history: Negative.  See flowsheet/record as well for more information.  Social history: Negative.  See flowsheet/record as well for more information.  Allergies, and medications have been entered into the medical record, reviewed, and no changes needed.    Review of Systems: No headache, visual changes, nausea, vomiting, diarrhea, constipation, dizziness, abdominal pain, skin rash, fevers, chills, night sweats, swollen lymph nodes, weight loss, chest pain, body aches, joint swelling, muscle aches, shortness of breath, mood changes, visual or auditory hallucinations.  Objective:    General: Well Developed, well nourished, and in no acute distress.  Neuro: Alert and oriented x3, extra-ocular muscles intact, sensation grossly intact. Cranial nerves II through XII are intact, motor, sensory, and coordinative functions are all intact. HEENT: Normocephalic, atraumatic, pupils equal round reactive to light, neck supple, no masses, no lymphadenopathy, thyroid nonpalpable. Oropharynx, nasopharynx, external ear canals are unremarkable. Skin: Warm and dry, no rashes noted.  Cardiac: Regular rate and rhythm, no murmurs rubs or gallops.  Respiratory: Clear to auscultation bilaterally. Not using accessory muscles, speaking in full sentences.  Abdominal: Soft, nontender, nondistended, positive bowel sounds, no masses, no organomegaly.  Left Elbow: Unremarkable to inspection. Range of motion  full pronation, supination, flexion, extension. Strength is full to all of the above directions Stable to varus, valgus stress. Negative moving valgus stress test. Tender to palpation at the common extensor tendon origin Ulnar nerve does not sublux. Negative cubital tunnel Tinel's.  Impression and Recommendations:    The patient was counselled, risk factors were discussed, anticipatory guidance given.  Elevated LFTs Repeating LFTs and abdominal ultrasound  Essential hypertension Rechecking routine blood work.  Gout Rechecking uric acid levels.  Hypertriglyceridemia Rechecking triglycerides, adding fenofibrate if still elevated. Continue Zetia and Vascepa.  Annual physical exam Annual physical as above, routine screening. Up-to-date on colon cancer screening. Declines tetanus vaccination, will do it next year.   Left lateral epicondylitis His elbow while trying arm wrestle his son. Rehabilitation exercises given, return in one month if no better for an injection.

## 2016-07-31 NOTE — Assessment & Plan Note (Signed)
Rechecking uric acid levels. 

## 2016-08-01 LAB — VITAMIN D 25 HYDROXY (VIT D DEFICIENCY, FRACTURES): Vit D, 25-Hydroxy: 19 ng/mL — ABNORMAL LOW (ref 30–100)

## 2016-08-01 LAB — HEMOGLOBIN A1C
Hgb A1c MFr Bld: 4.7 % (ref <5.7)
Mean Plasma Glucose: 88 mg/dL

## 2016-08-01 MED ORDER — FENOFIBRATE 160 MG PO TABS: 160.0000 mg | ORAL_TABLET | Freq: Every day | ORAL | 3 refills | Status: DC

## 2016-08-01 MED ORDER — VITAMIN D (ERGOCALCIFEROL) 1.25 MG (50000 UNIT) PO CAPS: 50000.0000 [IU] | ORAL_CAPSULE | ORAL | 0 refills | Status: DC

## 2016-08-01 NOTE — Addendum Note (Signed)
Addended by: Monica Becton on: 08/01/2016 10:40 AM   Modules accepted: Orders

## 2016-08-07 ENCOUNTER — Ambulatory Visit (INDEPENDENT_AMBULATORY_CARE_PROVIDER_SITE_OTHER): Payer: BLUE CROSS/BLUE SHIELD

## 2016-08-07 DIAGNOSIS — K760 Fatty (change of) liver, not elsewhere classified: Secondary | ICD-10-CM | POA: Diagnosis not present

## 2016-08-07 DIAGNOSIS — R7989 Other specified abnormal findings of blood chemistry: Secondary | ICD-10-CM

## 2016-08-07 DIAGNOSIS — R945 Abnormal results of liver function studies: Principal | ICD-10-CM

## 2016-08-09 DIAGNOSIS — M546 Pain in thoracic spine: Secondary | ICD-10-CM | POA: Diagnosis not present

## 2016-08-09 DIAGNOSIS — M542 Cervicalgia: Secondary | ICD-10-CM | POA: Diagnosis not present

## 2016-08-09 DIAGNOSIS — M5441 Lumbago with sciatica, right side: Secondary | ICD-10-CM | POA: Diagnosis not present

## 2016-08-10 ENCOUNTER — Telehealth: Payer: Self-pay

## 2016-08-10 NOTE — Telephone Encounter (Signed)
CXR isnt a recommended test for screening purposes even in smokers.  At age 51 low dose CT is however recommended.  The reason being CXR is not sensitive enough to find a lung cancer to save a life.  CT is, but only at age 75.

## 2016-08-10 NOTE — Telephone Encounter (Signed)
Pt left VM stating he smokes on occasion and usually get a chest x-ray with his physical and would like to have one for this year. Please advise.

## 2016-08-13 NOTE — Telephone Encounter (Signed)
Pt.notified

## 2016-08-15 ENCOUNTER — Encounter: Payer: Self-pay | Admitting: Sports Medicine

## 2016-08-15 ENCOUNTER — Ambulatory Visit
Admission: RE | Admit: 2016-08-15 | Discharge: 2016-08-15 | Disposition: A | Discharge status: Home / Self Care | Payer: BLUE CROSS/BLUE SHIELD | Source: Ambulatory Visit | Attending: Sports Medicine | Admitting: Sports Medicine

## 2016-08-15 ENCOUNTER — Ambulatory Visit (INDEPENDENT_AMBULATORY_CARE_PROVIDER_SITE_OTHER): Payer: BLUE CROSS/BLUE SHIELD | Admitting: Sports Medicine

## 2016-08-15 DIAGNOSIS — M48061 Spinal stenosis, lumbar region without neurogenic claudication: Secondary | ICD-10-CM | POA: Diagnosis not present

## 2016-08-15 DIAGNOSIS — M7712 Lateral epicondylitis, left elbow: Secondary | ICD-10-CM | POA: Diagnosis not present

## 2016-08-15 DIAGNOSIS — M545 Low back pain: Secondary | ICD-10-CM | POA: Diagnosis not present

## 2016-08-15 MED ORDER — IOPAMIDOL (ISOVUE-M 200) INJECTION 41%
1.0000 mL | Freq: Once | INTRAMUSCULAR | Status: AC
  Administered 2016-08-15: 1 mL via EPIDURAL

## 2016-08-15 MED ORDER — METHYLPREDNISOLONE ACETATE 40 MG/ML INJ SUSP (RADIOLOG
120.0000 mg | Freq: Once | INTRAMUSCULAR | Status: AC
  Administered 2016-08-15: 120 mg via EPIDURAL

## 2016-08-15 NOTE — Progress Notes (Signed)
Subjective:    CC: Follow-up  HPI: Left lateral epicondylitis: Persistent pain, he tells me he's been doing rehabilitation exercises for months, before we actually gave him the home rehabilitation regimen. He desires injection treatment today, pain is severe, persistent, localized at the lateral epicondyles without radiation.  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 He has  moderate to severe foraminal stenosis bilaterally at L3-L4 and L4-L5, previous epidurals have been on the left at L3-L4.  Complains of pain on the right, radiating down from deep in the buttock to the lateral thigh but not yet past the knee, worse with sitting, flexion, Valsalva. No bowel or bladder dysfunction, saddle numbness, constitutional symptoms.  Past medical history:  Negative.  See flowsheet/record as well for more information.  Surgical history: Negative.  See flowsheet/record as well for more information.  Family history: Negative.  See flowsheet/record as well for more information.  Social history: Negative.  See flowsheet/record as well for more information.  Allergies, and medications have been entered into the medical record, reviewed, and no changes needed.   Review of Systems: No fevers, chills, night sweats, weight loss, chest pain, or shortness of breath.   Objective:    General: Well Developed, well nourished, and in no acute distress.  Neuro: Alert and oriented x3, extra-ocular muscles intact, sensation grossly intact.  HEENT: Normocephalic, atraumatic, pupils equal round reactive to light, neck supple, no masses, no lymphadenopathy, thyroid nonpalpable.  Skin: Warm and dry, no rashes. Cardiac: Regular rate and rhythm, no murmurs rubs or gallops, no lower extremity edema.  Respiratory: Clear to auscultation bilaterally. Not using accessory muscles, speaking in full sentences. Left Elbow: Unremarkable to inspection. Range of motion  full pronation, supination, flexion, extension. Strength is full to all of the above directions Stable to varus, valgus stress. Negative moving valgus stress test. Tender to palpation of the common extensor tendon origin Ulnar nerve does not sublux. Negative cubital tunnel Tinel's.  Procedure: Real-time Ultrasound Guided Injection of left common extensor tendon origin Device: GE Logiq E  Verbal informed consent obtained.  Time-out conducted.  Noted no overlying erythema, induration, or other signs of local infection.  Skin prepped in a sterile fashion.  Local anesthesia: Topical Ethyl chloride.  With sterile technique and under real time ultrasound guidance:  Noted hypoechoic gap in the middle of the broad-based origin at the lateral epicondyle consistent with a tear, 25-gauge needle advanced and medication injected both superficial to, deep to, and into the tear of the common extensor tendon for a total of 1 mL Kenalog 40, 1 mL lidocaine, 1 mL bupivacaine Completed without difficulty  Pain immediately resolved suggesting accurate placement of the medication.  Advised to call if fevers/chills, erythema, induration, drainage, or persistent bleeding.  Images permanently stored and available for review in the ultrasound unit.  Impression: Technically successful ultrasound guided injection.  Impression and Recommendations:    Spinal stenosis of lumbar region Axial discogenic pain located in the buttock and lower lumbar spine, nothing overtly radicular today. Per the report from [facility], lumbar spine MRI in 2016 there are multilevel disc protrusions worst at L3-4 and L4-5, L4-L5 there is bilateral foraminal stenosis right worse than left. There is also multilevel facet arthritis though I do not have the images for review. We are going to proceed with a right L4-L5 interlaminar epidural. Return to see me one month after the injection to evaluate response.`  Left lateral epicondylitis Per  patient request injection  today.  I spent 40 minutes with this patient, greater than 50% was face-to-face time counseling regarding the above diagnoses, this was separate from the time spent performing the above procedure

## 2016-08-15 NOTE — Discharge Instructions (Signed)

## 2016-08-15 NOTE — Assessment & Plan Note (Signed)
Axial discogenic pain located in the buttock and lower lumbar spine, nothing overtly radicular today. Per the report from [facility], lumbar spine MRI in 2016 there are multilevel disc protrusions worst at L3-4 and L4-5, L4-L5 there is bilateral foraminal stenosis right worse than left. There is also multilevel facet arthritis though I do not have the images for review. We are going to proceed with a right L4-L5 interlaminar epidural. Return to see me one month after the injection to evaluate response.`

## 2016-08-15 NOTE — Assessment & Plan Note (Addendum)
Per patient request injection today.

## 2016-08-20 ENCOUNTER — Other Ambulatory Visit: Payer: Self-pay | Admitting: Sports Medicine

## 2016-08-22 ENCOUNTER — Telehealth: Payer: Self-pay

## 2016-08-22 DIAGNOSIS — M5136 Other intervertebral disc degeneration, lumbar region: Secondary | ICD-10-CM

## 2016-08-22 DIAGNOSIS — M51369 Other intervertebral disc degeneration, lumbar region without mention of lumbar back pain or lower extremity pain: Secondary | ICD-10-CM

## 2016-08-22 MED ORDER — PREDNISONE 50 MG PO TABS: 50.0000 mg | ORAL_TABLET | Freq: Every day | ORAL | 0 refills | Status: DC

## 2016-08-22 NOTE — Telephone Encounter (Signed)
Pt states the pain is still the same after epidural and that he didn't get relief last time until after shot two. Would like to know what he can do for now and when he can get his next injection. Please advise.

## 2016-08-22 NOTE — Telephone Encounter (Signed)
Adding 5 days of prednisone to calm things down.

## 2016-08-23 NOTE — Telephone Encounter (Signed)
Pt states prednisone doesn't work for him and makes him feel crazy. He would like to get another epidural scheduled for Thursday, states he has his MRI from 2016 and it will be going to [facility].

## 2016-08-24 NOTE — Telephone Encounter (Signed)
Epidural ordered.  

## 2016-09-16 ENCOUNTER — Other Ambulatory Visit: Payer: Self-pay | Admitting: Sports Medicine

## 2016-09-17 ENCOUNTER — Other Ambulatory Visit: Payer: Self-pay | Admitting: Sports Medicine

## 2016-11-15 ENCOUNTER — Ambulatory Visit: Payer: BLUE CROSS/BLUE SHIELD | Admitting: Sports Medicine

## 2016-11-19 ENCOUNTER — Other Ambulatory Visit: Payer: Self-pay | Admitting: Sports Medicine

## 2016-11-19 NOTE — Telephone Encounter (Signed)
Patient needs a refill for alprazolam and [facility]olin [facility] [place]

## 2016-12-04 ENCOUNTER — Telehealth: Payer: Self-pay | Admitting: Sports Medicine

## 2016-12-04 DIAGNOSIS — L570 Actinic keratosis: Secondary | ICD-10-CM | POA: Diagnosis not present

## 2016-12-04 DIAGNOSIS — L400 Psoriasis vulgaris: Secondary | ICD-10-CM | POA: Diagnosis not present

## 2016-12-04 NOTE — Telephone Encounter (Signed)
Dr T: Gabriel Wong Bracket called. He states he threw away his Advair by accident(he had an expired Advair on hand and instead of throwing that away he threw the new one away)Insurance will not cover a new rx until the  26th. He wants to know what the risk factor would be if he waited until the 26th. Spoke to Adel and she recommended that patient come in to get another med in place of Advair and also to discuss risk factors but  due to patient's tight schedule he declined.. Patient was frustrated because he thought we could just call  his Doctorand get this taken care of over the phone. As it stands now patient will have to pay $340 out of pocket for medication which he has decided to do. Thank you

## 2016-12-04 NOTE — Telephone Encounter (Signed)
I could just call in a new dose and then they would have to cover it, then some time later we switch back to the original dose.  If this is ok with him I'll do it.

## 2016-12-05 NOTE — Telephone Encounter (Signed)
Spoke to patient.  He went ahead and paid for script and   will just chalk this up to his mistake.

## 2017-01-10 DIAGNOSIS — H35711 Central serous chorioretinopathy, right eye: Secondary | ICD-10-CM | POA: Diagnosis not present

## 2017-01-10 DIAGNOSIS — H35722 Serous detachment of retinal pigment epithelium, left eye: Secondary | ICD-10-CM | POA: Diagnosis not present

## 2017-01-10 DIAGNOSIS — H35712 Central serous chorioretinopathy, left eye: Secondary | ICD-10-CM | POA: Diagnosis not present

## 2017-01-10 DIAGNOSIS — H35721 Serous detachment of retinal pigment epithelium, right eye: Secondary | ICD-10-CM | POA: Diagnosis not present

## 2017-01-15 ENCOUNTER — Other Ambulatory Visit: Payer: Self-pay | Admitting: Sports Medicine

## 2017-01-18 ENCOUNTER — Other Ambulatory Visit: Payer: Self-pay | Admitting: Sports Medicine

## 2017-01-22 ENCOUNTER — Ambulatory Visit (INDEPENDENT_AMBULATORY_CARE_PROVIDER_SITE_OTHER): Payer: BLUE CROSS/BLUE SHIELD | Admitting: Family Medicine

## 2017-01-22 ENCOUNTER — Encounter: Payer: Self-pay | Admitting: Family Medicine

## 2017-01-22 VITALS — BP 124/83 | HR 71 | Temp 98.2°F | Ht 70.0 in | Wt 193.0 lb

## 2017-01-22 DIAGNOSIS — J4530 Mild persistent asthma, uncomplicated: Secondary | ICD-10-CM

## 2017-01-22 DIAGNOSIS — J020 Streptococcal pharyngitis: Secondary | ICD-10-CM

## 2017-01-22 DIAGNOSIS — Z23 Encounter for immunization: Secondary | ICD-10-CM

## 2017-01-22 DIAGNOSIS — J029 Acute pharyngitis, unspecified: Secondary | ICD-10-CM | POA: Diagnosis not present

## 2017-01-22 LAB — POCT RAPID STREP A (OFFICE): RAPID STREP A SCREEN: POSITIVE — AB

## 2017-01-22 MED ORDER — PENICILLIN G BENZATHINE 1200000 UNIT/2ML IM SUSP
1.2000 10*6.[IU] | Freq: Once | INTRAMUSCULAR | Status: AC
  Administered 2017-01-22: 1.2 10*6.[IU] via INTRAMUSCULAR

## 2017-01-22 NOTE — Progress Notes (Signed)
   Subjective:    Patient ID: Gabriel Wong, male    DOB: 26-Mar-1966, 51 y.o.   MRN: [id number]  HPI 51 year old male comes in today with 7 days of sore throat, nausea and headache. He does typically have seasonal allergies and says sometimes he'll get sore throat with that but normally only last 2-3 days and this has been more persistent. Then yesterday he started to notice that his chest was feeling a little bit tight had some coughing. He does have allergic asthma. Off drops and that helped. He has not used his albuterol. He is a smoker currently. Denies fever, chills or sweats.      Review of Systems     Objective:   Physical Exam  Constitutional: He is oriented to person, place, and time. He appears well-developed and well-nourished.  HENT:  Head: Normocephalic and atraumatic.  Right Ear: External ear normal.  Left Ear: External ear normal.  Nose: Nose normal.  TMs and canals are clear. Op with diffuse erythema with no lesions or spots.   Eyes: Pupils are equal, round, and reactive to light. Conjunctivae and EOM are normal.  Neck: Neck supple. No thyromegaly present.  Cardiovascular: Normal rate and normal heart sounds.   Pulmonary/Chest: Effort normal and breath sounds normal.  Lymphadenopathy:    He has no cervical adenopathy.  Neurological: He is alert and oriented to person, place, and time.  Skin: Skin is warm and dry.  Psychiatric: He has a normal mood and affect.          Assessment & Plan:  Streptococcal pharyngitis-we'll treat with 1.2 million units of penicillin IM. Call if not significantly better in one week. Make sure hydrating well. Okay to use ibuprofen and Tylenol for fever and pain relief. Recommend getting a new toothbrush after about 48 hours.  Mild intermittent asthma-did encourage him to use albuterol as needed particular for cough and tightness in the chest.  Flu vaccine given today.

## 2017-01-22 NOTE — Patient Instructions (Addendum)

## 2017-02-06 ENCOUNTER — Telehealth: Payer: Self-pay | Admitting: Family Medicine

## 2017-02-06 NOTE — Telephone Encounter (Signed)
Yes, okay to take the amoxicillin.  He is not better after that and he needs to be seen again to see what is going on.

## 2017-02-06 NOTE — Telephone Encounter (Signed)
Pt advised.

## 2017-02-06 NOTE — Telephone Encounter (Signed)
Patient called seen you on 01/22/17 for strep and got a penicillin shot at our office that helped strep clear up and still having upper Respiratory issues girlfriend gave him z-pack from [place] it did not help much and wondering if he can take Amoxicillin to knock it out. Pt's girlfriend can get from pharmacy back home. Please adv so I can call pt back [phone]. Thanks

## 2017-03-14 DIAGNOSIS — M542 Cervicalgia: Secondary | ICD-10-CM | POA: Diagnosis not present

## 2017-03-14 DIAGNOSIS — M546 Pain in thoracic spine: Secondary | ICD-10-CM | POA: Diagnosis not present

## 2017-03-14 DIAGNOSIS — M5441 Lumbago with sciatica, right side: Secondary | ICD-10-CM | POA: Diagnosis not present

## 2017-03-18 ENCOUNTER — Other Ambulatory Visit: Payer: Self-pay | Admitting: Sports Medicine

## 2017-04-10 DIAGNOSIS — L570 Actinic keratosis: Secondary | ICD-10-CM | POA: Diagnosis not present

## 2017-04-14 ENCOUNTER — Other Ambulatory Visit: Payer: Self-pay | Admitting: Sports Medicine

## 2017-04-18 ENCOUNTER — Other Ambulatory Visit: Payer: Self-pay | Admitting: Sports Medicine

## 2017-05-06 ENCOUNTER — Ambulatory Visit (INDEPENDENT_AMBULATORY_CARE_PROVIDER_SITE_OTHER): Payer: BLUE CROSS/BLUE SHIELD | Admitting: Family Medicine

## 2017-05-06 ENCOUNTER — Telehealth: Payer: Self-pay

## 2017-05-06 ENCOUNTER — Encounter: Payer: Self-pay | Admitting: Family Medicine

## 2017-05-06 VITALS — BP 120/75 | HR 68 | Temp 97.9°F | Ht 70.0 in | Wt 196.0 lb

## 2017-05-06 DIAGNOSIS — R109 Unspecified abdominal pain: Secondary | ICD-10-CM | POA: Diagnosis not present

## 2017-05-06 LAB — CBC WITH DIFFERENTIAL/PLATELET
BASOS ABS: 72 {cells}/uL (ref 0–200)
Basophils Relative: 1.5 %
EOS PCT: 5 %
Eosinophils Absolute: 240 cells/uL (ref 15–500)
HCT: 45.7 % (ref 38.5–50.0)
HEMOGLOBIN: 16.3 g/dL (ref 13.2–17.1)
Lymphs Abs: 1579 cells/uL (ref 850–3900)
MCH: 33.9 pg — ABNORMAL HIGH (ref 27.0–33.0)
MCHC: 35.7 g/dL (ref 32.0–36.0)
MCV: 95 fL (ref 80.0–100.0)
MONOS PCT: 12.1 %
MPV: 11.2 fL (ref 7.5–12.5)
NEUTROS ABS: 2328 {cells}/uL (ref 1500–7800)
Neutrophils Relative %: 48.5 %
Platelets: 219 10*3/uL (ref 140–400)
RBC: 4.81 10*6/uL (ref 4.20–5.80)
RDW: 12.1 % (ref 11.0–15.0)
Total Lymphocyte: 32.9 %
WBC mixed population: 581 cells/uL (ref 200–950)
WBC: 4.8 10*3/uL (ref 3.8–10.8)

## 2017-05-06 LAB — COMPLETE METABOLIC PANEL WITH GFR
AG Ratio: 1.8 (calc) (ref 1.0–2.5)
ALBUMIN MSPROF: 5 g/dL (ref 3.6–5.1)
ALKALINE PHOSPHATASE (APISO): 52 U/L (ref 40–115)
ALT: 139 U/L — ABNORMAL HIGH (ref 9–46)
AST: 137 U/L — ABNORMAL HIGH (ref 10–35)
BUN: 17 mg/dL (ref 7–25)
CO2: 31 mmol/L (ref 20–32)
CREATININE: 0.93 mg/dL (ref 0.70–1.33)
Calcium: 10.2 mg/dL (ref 8.6–10.3)
Chloride: 103 mmol/L (ref 98–110)
GFR, Est African American: 110 mL/min/{1.73_m2} (ref >=60)
GFR, Est Non African American: 95 mL/min/{1.73_m2} (ref >=60)
GLUCOSE: 119 mg/dL — AB (ref 65–99)
Globulin: 2.8 g/dL (calc) (ref 1.9–3.7)
Potassium: 4 mmol/L (ref 3.5–5.3)
Sodium: 143 mmol/L (ref 135–146)
Total Bilirubin: 1.4 mg/dL — ABNORMAL HIGH (ref 0.2–1.2)
Total Protein: 7.8 g/dL (ref 6.1–8.1)

## 2017-05-06 LAB — LIPASE: LIPASE: 31 U/L (ref 7–60)

## 2017-05-06 LAB — POCT URINALYSIS DIPSTICK
Blood, UA: NEGATIVE
Glucose, UA: NEGATIVE
KETONES UA: NEGATIVE
LEUKOCYTES UA: NEGATIVE
NITRITE UA: NEGATIVE
PH UA: 6.5 (ref 5.0–8.0)
Spec Grav, UA: 1.02 (ref 1.010–1.025)
UROBILINOGEN UA: 2 U/dL — AB

## 2017-05-06 MED ORDER — CIPROFLOXACIN HCL 500 MG PO TABS: 500.0000 mg | ORAL_TABLET | Freq: Two times a day (BID) | ORAL | 0 refills | Status: DC

## 2017-05-06 NOTE — Progress Notes (Signed)
Gabriel Wong is a 52 y.o. male who presents to [facility]: Primary Care Sports Medicine today for pelvic pressure associated with urinary frequency and urgency.  Symptoms present now for a few days.  He notes central pelvic pressure.  He denies any penile discharge fevers or chills vomiting or.  He denies severe abdominal pain.  He has not tried any changes in diet.  No treatment tried yet.  AUA symptom score 15/35 Quality of life score 2/6  Past Medical History:  Diagnosis Date  . Alcoholic hepatitis   . Anxiety   . Asthma   . Gout 04/28/2015  . Hyperlipemia   . Hypertension   . Insomnia   . Rosacea   . Seasonal allergies    Past Surgical History:  Procedure Laterality Date  . BACK SURGERY    . FISSURECTOMY     Social History   Tobacco Use  . Smoking status: Current Every Day Smoker    Packs/day: 0.25    Years: 25.00    Pack years: 6.25    Types: Cigarettes, Cigars  . Smokeless tobacco: Never Used  Substance Use Topics  . Alcohol use: Yes   family history is not on file.  ROS as above:  Medications: Current Outpatient Medications  Medication Sig Dispense Refill  . ADVAIR DISKUS 100-50 MCG/DOSE AEPB Inhale 1 puff into the lungs 2 (two) times daily. NEEDS APPOINTMENT FOR FUTURE REFILLS. 60 each 0  . albuterol (PROVENTIL HFA;VENTOLIN HFA) 108 (90 Base) MCG/ACT inhaler INHALE 2 PUFFS INTO THE LUNGS EVERY 4 TO 6 HOURS ONLY AS NEEDED FOR SHORTNESS OF BREATH OR WHEEZING. 18 Inhaler 1  . allopurinol (ZYLOPRIM) 300 MG tablet Take 1 tablet (300 mg total) by mouth daily. 90 tablet 3  . ALPRAZolam (XANAX) 0.25 MG tablet TAKE 1 TABLET BY MOUTH TWICE A DAY AS NEEDED 60 tablet 1  . amLODipine (NORVASC) 5 MG tablet TAKE 1 TABLET BY MOUTH DAILY 90 tablet 3  . colchicine 0.6 MG tablet Take 1 tablet (0.6 mg total) by mouth daily as needed. 30 tablet 1  . eplerenone (INSPRA) 25 MG tablet  Take 1 tablet (25 mg total) by mouth daily. 90 tablet 3  . ezetimibe (ZETIA) 10 MG tablet TAKE 1 TABLET (10 MG TOTAL) BY MOUTH DAILY. 90 tablet 0  . fenofibrate 160 MG tablet Take 1 tablet (160 mg total) by mouth daily. 90 tablet 3  . fluticasone (FLONASE) 50 MCG/ACT nasal spray INHALE 2 SPRAYS INTO BOTH NOSTRILS DAILY. 16 g 11  . gabapentin (NEURONTIN) 100 MG capsule Take 1 capsule (100 mg total) by mouth 4 (four) times daily as needed (nerve pain). 120 capsule 11  . Icosapent Ethyl (VASCEPA) 1 g CAPS Take 2 capsules by mouth 2 (two) times daily. 120 capsule 11  . irbesartan (AVAPRO) 300 MG tablet Take 1 tablet (300 mg total) by mouth daily. 90 tablet 3  . lansoprazole (PREVACID) 30 MG capsule Take 1 capsule (30 mg total) by mouth 2 (two) times daily before a meal. 180 capsule 3  . montelukast (SINGULAIR) 10 MG tablet TAKE 1 TABLET (10 MG TOTAL) BY MOUTH AT BEDTIME. 90 tablet 3  . Vitamin D, Ergocalciferol, (DRISDOL) 50000 units CAPS capsule Take 1 capsule (50,000 Units total) by mouth every 7 (seven) days. Take for 8 total doses(weeks) 8 capsule 0  . ciprofloxacin (CIPRO) 500 MG tablet Take 1 tablet (500 mg total) by mouth 2 (two) times daily. 28 tablet 0  No current facility-administered medications for this visit.    Allergies  Allergen Reactions  . Lipitor [Atorvastatin] Other (See Comments)    Elevated LFTs  . Tylenol [Acetaminophen]     Health Maintenance Health Maintenance  Topic Date Due  . TETANUS/TDAP  06/02/1984  . Fecal DNA (Cologuard)  08/16/2018  . INFLUENZA VACCINE  Completed  . HIV Screening  Completed     Exam:  BP 120/75   Pulse 68   Temp 97.9 F (36.6 C) (Oral)   Ht 5\' 10"  (1.778 m)   Wt 196 lb (88.9 kg)   BMI 28.12 kg/m  Gen: Well NAD HEENT: EOMI,  MMM Lungs: Normal work of breathing. CTABL Heart: RRR no MRG Abd: NABS, Soft. Nondistended, minimally tender without rebound or guarding or masses palpated over central lower pelvis Exts: Brisk capillary  refill, warm and well perfused.  Rectal exam normal rectal tone.  Prostate is normal sized firm and nontender.   Results for orders placed or performed in visit on 05/06/17 (from the past 72 hour(s))  POCT Urinalysis Dipstick     Status: Abnormal   Collection Time: 05/06/17  9:56 AM  Result Value Ref Range   Color, UA YELLOW    Clarity, UA CLEAR    Glucose, UA NEGATIVE    Bilirubin, UA SMALL    Ketones, UA NEGATIVE    Spec Grav, UA 1.020 1.010 - 1.025   Blood, UA NEGATIVE    pH, UA 6.5 5.0 - 8.0   Protein, UA 30MG     Urobilinogen, UA 2.0 (A) 0.2 or 1.0 E.U./dL   Nitrite, UA NEGATIVE    Leukocytes, UA Negative Negative   Appearance     Odor     No results found.    Assessment and Plan: 52 y.o. male with pelvic pressure unclear etiology.  Symptoms are very concerning for prostatitis.  Exam is less so.  Plan for urine culture as well as labs listed below.  Empiric treatment with Cipro.   Orders Placed This Encounter  Procedures  . Urine Culture  . CBC with Differential/Platelet  . COMPLETE METABOLIC PANEL WITH GFR  . Lipase  . POCT Urinalysis Dipstick   Meds ordered this encounter  Medications  . ciprofloxacin (CIPRO) 500 MG tablet    Sig: Take 1 tablet (500 mg total) by mouth 2 (two) times daily.    Dispense:  28 tablet    Refill:  0     Discussed warning signs or symptoms. Please see discharge instructions. Patient expresses understanding.

## 2017-05-06 NOTE — Patient Instructions (Signed)
Thank you for coming in today. Take cipro twice daily for 2 weeks.  Get blood work today.  Recheck sooner if needed.    Prostatitis Prostatitis is swelling of the prostate gland. The prostate helps to make semen. It is below a man's bladder, in front of the rectum. There are different types of prostatitis. Follow these instructions at home:  Take over-the-counter and prescription medicines only as told by your doctor.  If you were prescribed an antibiotic medicine, take it as told by your doctor. Do not stop taking the antibiotic even if you start to feel better.  If your doctor prescribed exercises, do them as directed.  Take sitz baths as told by your doctor. To take a sitz bath, sit in warm water that is deep enough to cover your hips and butt.  Keep all follow-up visits as told by your doctor. This is important. Contact a doctor if:  Your symptoms get worse.  You have a fever. Get help right away if:  You have chills.  You feel sick to your stomach (nauseous).  You throw up (vomit).  You feel light-headed.  You feel like you might pass out (faint).  You cannot pee (urinate).  You have blood or clumps of blood (blood clots) in your pee (urine). This information is not intended to replace advice given to you by your health care provider. Make sure you discuss any questions you have with your health care provider. Document Released: 10/02/2011 Document Revised: 12/22/2015 Document Reviewed: 12/22/2015 Elsevier Interactive Patient Education  2017 [employer]

## 2017-05-06 NOTE — Telephone Encounter (Signed)
PT has a question and has asked for Dr. Denyse Amassorey or his nurse to call him back. PT would not disclose the question he had.

## 2017-05-06 NOTE — Telephone Encounter (Signed)
Patient called in stating he was advised by pharmacist that Cipro has an interaction with Eplerenone (InSpra) and to advised his provider.   Please advise if antibiotic is okay for patient to take or a new Rx will be electronically sent in to pharmacy.

## 2017-05-06 NOTE — Telephone Encounter (Signed)
Patient stated that he went to pick up the Cipro and the pharmacist said there was a interaction with the Cipro and the Eplerenone. Please advise. Rhonda Cunningham,CMA

## 2017-05-07 LAB — URINE CULTURE
MICRO NUMBER:: 90086236
SPECIMEN QUALITY: ADEQUATE

## 2017-05-07 NOTE — Telephone Encounter (Signed)
I think it will be ok to take the cipro. Follow up with Dr T in a few weeks.

## 2017-05-07 NOTE — Telephone Encounter (Signed)
Patient has been advised. Rhonda Cunningham,CMA  

## 2017-05-08 NOTE — Telephone Encounter (Signed)
Routing to Provider covering Denyse AmassCorey today for review.

## 2017-05-08 NOTE — Progress Notes (Signed)
Urine culture was negative There could still be an infection of the prostate Recommend completing course of Cipro and follow-up with Dr. Karie Schwalbe if still having symptoms after finishing the antibiotic

## 2017-05-08 NOTE — Telephone Encounter (Signed)
This is not a serious drug interaction Cipro can affect the metabolism of Eplerenone, which can mean there are slightly higher concentrations of the drug  It is okay to take both

## 2017-05-08 NOTE — Telephone Encounter (Signed)
Patient was advised by Dr Denyse Amassorey on Tuesday.

## 2017-05-16 ENCOUNTER — Other Ambulatory Visit: Payer: Self-pay | Admitting: Sports Medicine

## 2017-05-16 DIAGNOSIS — E781 Pure hyperglyceridemia: Secondary | ICD-10-CM

## 2017-05-16 MED ORDER — COLCHICINE 0.6 MG PO TABS: 0.6000 mg | ORAL_TABLET | Freq: Every day | ORAL | 0 refills | Status: DC | PRN

## 2017-05-17 ENCOUNTER — Other Ambulatory Visit: Payer: Self-pay | Admitting: Sports Medicine

## 2017-05-17 MED ORDER — COLCHICINE 0.6 MG PO TABS: 0.6000 mg | ORAL_TABLET | Freq: Every day | ORAL | 0 refills | Status: DC | PRN

## 2017-06-04 DIAGNOSIS — Q386 Other congenital malformations of mouth: Secondary | ICD-10-CM | POA: Diagnosis not present

## 2017-06-04 DIAGNOSIS — B078 Other viral warts: Secondary | ICD-10-CM | POA: Diagnosis not present

## 2017-06-04 DIAGNOSIS — L821 Other seborrheic keratosis: Secondary | ICD-10-CM | POA: Diagnosis not present

## 2017-06-06 ENCOUNTER — Ambulatory Visit (INDEPENDENT_AMBULATORY_CARE_PROVIDER_SITE_OTHER): Payer: BLUE CROSS/BLUE SHIELD | Admitting: Sports Medicine

## 2017-06-06 ENCOUNTER — Other Ambulatory Visit: Payer: Self-pay

## 2017-06-06 ENCOUNTER — Encounter: Payer: Self-pay | Admitting: Sports Medicine

## 2017-06-06 ENCOUNTER — Telehealth: Payer: Self-pay

## 2017-06-06 DIAGNOSIS — M2550 Pain in unspecified joint: Secondary | ICD-10-CM | POA: Diagnosis not present

## 2017-06-06 DIAGNOSIS — E781 Pure hyperglyceridemia: Secondary | ICD-10-CM | POA: Diagnosis not present

## 2017-06-06 DIAGNOSIS — M1A9XX0 Chronic gout, unspecified, without tophus (tophi): Secondary | ICD-10-CM

## 2017-06-06 DIAGNOSIS — N139 Obstructive and reflux uropathy, unspecified: Secondary | ICD-10-CM | POA: Insufficient documentation

## 2017-06-06 DIAGNOSIS — K7010 Alcoholic hepatitis without ascites: Secondary | ICD-10-CM | POA: Diagnosis not present

## 2017-06-06 MED ORDER — DICLOFENAC SODIUM 2 % TD SOLN: 2.0000 | Freq: Two times a day (BID) | TRANSDERMAL | 11 refills | Status: DC

## 2017-06-06 NOTE — Progress Notes (Signed)
f °

## 2017-06-06 NOTE — Assessment & Plan Note (Signed)
Adding topical diclofenac. This may very well be related to his psoriatic arthritis or uncontrolled gout.   Also checking his uric acid levels.

## 2017-06-06 NOTE — Telephone Encounter (Signed)
Pt would like to know if he should be taking Fenofibrate since he has his current kidney/liver concerns. Please advise.

## 2017-06-06 NOTE — Assessment & Plan Note (Signed)
LFTs are now over 100, he is going to drop his drinking in half. Rechecking liver ultrasound, LFTs. Previous liver ultrasound was almost a year ago and showed stable steatosis.

## 2017-06-06 NOTE — Assessment & Plan Note (Signed)
Likely combination of BPH, he likely did have mild prostatitis which has resolved with ciprofloxacin. Nocturia is for the most part self created when he drinks before going to bed. At this point he has no interest in alpha blockers for this.

## 2017-06-06 NOTE — Assessment & Plan Note (Signed)
Currently taking 200 mg of allopurinol, rechecking uric acid levels, goal is less than 5.

## 2017-06-06 NOTE — Progress Notes (Signed)
Subjective:    CC: Follow-up  HPI: Pelvic pressure: Resolved with ciprofloxacin suggesting there was an element of prostatitis, he does occasionally get a few episodes of nocturia, hesitancy, dribbling, weak stream.  These typically occur when he drinks a lot before going to bed, otherwise he may wake up 0-1 times.  No fevers, chills, overall asymptomatic now.  Transaminitis: Deniece PortelaWayne does have a very high alcohol consumption, he will drink several alcoholic and vodka beverages per night and when he goes out with his friends which is quite often, he will often do 8 double shots of vodka per night.  He has had a fairly stable low-grade transaminitis less than 100 for years, we have been checking yearly liver ultrasound which have showed stable steatohepatitis.  More recently his liver function test increased to well over 100.  He does tell me they have been over 800 in the past.  He is agreeable to decrease his alcohol consumption.  Polyarthralgia: Has decreased his allopurinol to 200 mg from 300 mg daily.  He does also have psoriasis, with psoriatic nail pitting, wonders if he may have psoriatic arthritis.  I reviewed the past medical history, family history, social history, surgical history, and allergies today and no changes were needed.  Please see the problem list section below in epic for further details.  Past Medical History: Past Medical History:  Diagnosis Date  . Alcoholic hepatitis   . Anxiety   . Asthma   . Gout 04/28/2015  . Hyperlipemia   . Hypertension   . Insomnia   . Rosacea   . Seasonal allergies    Past Surgical History: Past Surgical History:  Procedure Laterality Date  . BACK SURGERY    . FISSURECTOMY     Social History: Social History   Socioeconomic History  . Marital status: Single    Spouse name: None  . Number of children: None  . Years of education: None  . Highest education level: None  Social Needs  . Financial resource strain: None  . Food  insecurity - worry: None  . Food insecurity - inability: None  . Transportation needs - medical: None  . Transportation needs - non-medical: None  Occupational History  . None  Tobacco Use  . Smoking status: Current Every Day Smoker    Packs/day: 0.25    Years: 25.00    Pack years: 6.25    Types: Cigarettes, Cigars  . Smokeless tobacco: Never Used  Substance and Sexual Activity  . Alcohol use: Yes  . Drug use: No  . Sexual activity: None  Other Topics Concern  . None  Social History Narrative  . None   Family History: No family history on file. Allergies: Allergies  Allergen Reactions  . Lipitor [Atorvastatin] Other (See Comments)    Elevated LFTs  . Tylenol [Acetaminophen]    Medications: See med rec.  Review of Systems: No fevers, chills, night sweats, weight loss, chest pain, or shortness of breath.   Objective:    General: Well Developed, well nourished, and in no acute distress.  Neuro: Alert and oriented x3, extra-ocular muscles intact, sensation grossly intact.  HEENT: Normocephalic, atraumatic, pupils equal round reactive to light, neck supple, no masses, no lymphadenopathy, thyroid nonpalpable.  Skin: Warm and dry, no rashes. Cardiac: Regular rate and rhythm, no murmurs rubs or gallops, no lower extremity edema.  Respiratory: Clear to auscultation bilaterally. Not using accessory muscles, speaking in full sentences.  Impression and Recommendations:    Alcoholic hepatitis without ascites  LFTs are now over 100, he is going to drop his drinking in half. Rechecking liver ultrasound, LFTs. Previous liver ultrasound was almost a year ago and showed stable steatosis.  Gout Currently taking 200 mg of allopurinol, rechecking uric acid levels, goal is less than 5.  Hypertriglyceridemia Rechecking lipids.  Obstructive uropathy Likely combination of BPH, he likely did have mild prostatitis which has resolved with ciprofloxacin. Nocturia is for the most part  self created when he drinks before going to bed. At this point he has no interest in alpha blockers for this.  Polyarthralgia Adding topical diclofenac. This may very well be related to his psoriatic arthritis or uncontrolled gout.   Also checking his uric acid levels.  I spent 40 minutes with this patient, greater than 50% was face-to-face time counseling regarding the above diagnoses ___________________________________________ [name]. Benjamin Stain, M.D., ABFM., CAQSM. Primary Care and Sports Medicine [facility]  Adjunct Instructor of Family Medicine  [employer]

## 2017-06-06 NOTE — Telephone Encounter (Signed)
Yes, the benefits outweigh the risk in this case.  Since he is decreasing his alcohol consumption it will become even safer.

## 2017-06-06 NOTE — Assessment & Plan Note (Signed)
Rechecking lipids. 

## 2017-06-07 NOTE — Telephone Encounter (Signed)
Pt notified. No questions or concerns at this time

## 2017-06-17 ENCOUNTER — Other Ambulatory Visit: Payer: Self-pay | Admitting: Sports Medicine

## 2017-06-18 ENCOUNTER — Telehealth: Payer: Self-pay

## 2017-06-18 MED ORDER — TAMSULOSIN HCL 0.4 MG PO CAPS: 0.4000 mg | ORAL_CAPSULE | Freq: Every day | ORAL | 3 refills | Status: DC

## 2017-06-18 NOTE — Telephone Encounter (Signed)
Patient advised of medication being sent to the pharmacy. 

## 2017-06-18 NOTE — Telephone Encounter (Signed)
At the last visit he had said he had no interest in this kind of medication, sounds like he is interested now, I am going to add Flomax.

## 2017-06-18 NOTE — Telephone Encounter (Signed)
Pt states you were going to give him medication to help with his urinary flow. Please advise.

## 2017-06-20 DIAGNOSIS — M542 Cervicalgia: Secondary | ICD-10-CM | POA: Diagnosis not present

## 2017-06-20 DIAGNOSIS — M5441 Lumbago with sciatica, right side: Secondary | ICD-10-CM | POA: Diagnosis not present

## 2017-06-20 DIAGNOSIS — M546 Pain in thoracic spine: Secondary | ICD-10-CM | POA: Diagnosis not present

## 2017-07-04 ENCOUNTER — Ambulatory Visit (INDEPENDENT_AMBULATORY_CARE_PROVIDER_SITE_OTHER): Payer: BLUE CROSS/BLUE SHIELD

## 2017-07-04 DIAGNOSIS — K7010 Alcoholic hepatitis without ascites: Secondary | ICD-10-CM

## 2017-07-04 DIAGNOSIS — E781 Pure hyperglyceridemia: Secondary | ICD-10-CM | POA: Diagnosis not present

## 2017-07-04 DIAGNOSIS — M1A9XX0 Chronic gout, unspecified, without tophus (tophi): Secondary | ICD-10-CM | POA: Diagnosis not present

## 2017-07-04 LAB — COMPREHENSIVE METABOLIC PANEL WITH GFR
Albumin: 4.7 g/dL (ref 3.6–5.1)
Chloride: 105 mmol/L (ref 98–110)
Creat: 0.84 mg/dL (ref 0.70–1.33)
Globulin: 2.6 g/dL (ref 1.9–3.7)
Glucose, Bld: 107 mg/dL — ABNORMAL HIGH (ref 65–99)
Potassium: 3.6 mmol/L (ref 3.5–5.3)
Sodium: 140 mmol/L (ref 135–146)
Total Protein: 7.3 g/dL (ref 6.1–8.1)

## 2017-07-04 LAB — COMPREHENSIVE METABOLIC PANEL
AG Ratio: 1.8 (calc) (ref 1.0–2.5)
ALT: 120 U/L — ABNORMAL HIGH (ref 9–46)
AST: 117 U/L — ABNORMAL HIGH (ref 10–35)
Alkaline phosphatase (APISO): 48 U/L (ref 40–115)
BUN: 14 mg/dL (ref 7–25)
CO2: 30 mmol/L (ref 20–32)
Calcium: 9.8 mg/dL (ref 8.6–10.3)
Total Bilirubin: 1.1 mg/dL (ref 0.2–1.2)

## 2017-07-04 LAB — LIPID PANEL W/REFLEX DIRECT LDL
Cholesterol: 208 mg/dL — ABNORMAL HIGH (ref <200)
HDL: 39 mg/dL — ABNORMAL LOW (ref >40)
LDL Cholesterol (Calc): 139 mg/dL — ABNORMAL HIGH
Non-HDL Cholesterol (Calc): 169 mg/dL (calc) — ABNORMAL HIGH (ref <130)
Total CHOL/HDL Ratio: 5.3 (calc) — ABNORMAL HIGH (ref <5.0)
Triglycerides: 160 mg/dL — ABNORMAL HIGH (ref <150)

## 2017-07-04 LAB — CBC
HCT: 45.3 % (ref 38.5–50.0)
Hemoglobin: 16.1 g/dL (ref 13.2–17.1)
MCH: 33.5 pg — ABNORMAL HIGH (ref 27.0–33.0)
MCHC: 35.5 g/dL (ref 32.0–36.0)
MCV: 94.4 fL (ref 80.0–100.0)
MPV: 11.4 fL (ref 7.5–12.5)
Platelets: 237 Thousand/uL (ref 140–400)
RBC: 4.8 Million/uL (ref 4.20–5.80)
RDW: 12.4 % (ref 11.0–15.0)
WBC: 5.5 10*3/uL (ref 3.8–10.8)

## 2017-07-04 LAB — URIC ACID: Uric Acid, Serum: 4.9 mg/dL (ref 4.0–8.0)

## 2017-07-11 DIAGNOSIS — H35721 Serous detachment of retinal pigment epithelium, right eye: Secondary | ICD-10-CM | POA: Diagnosis not present

## 2017-07-11 DIAGNOSIS — H04123 Dry eye syndrome of bilateral lacrimal glands: Secondary | ICD-10-CM | POA: Diagnosis not present

## 2017-07-11 DIAGNOSIS — H35711 Central serous chorioretinopathy, right eye: Secondary | ICD-10-CM | POA: Diagnosis not present

## 2017-07-11 DIAGNOSIS — H11433 Conjunctival hyperemia, bilateral: Secondary | ICD-10-CM | POA: Diagnosis not present

## 2017-07-15 ENCOUNTER — Other Ambulatory Visit: Payer: Self-pay | Admitting: Sports Medicine

## 2017-07-16 ENCOUNTER — Other Ambulatory Visit: Payer: Self-pay | Admitting: Sports Medicine

## 2017-07-16 ENCOUNTER — Encounter: Payer: BLUE CROSS/BLUE SHIELD | Admitting: Sports Medicine

## 2017-07-16 ENCOUNTER — Other Ambulatory Visit: Payer: Self-pay | Admitting: Family Medicine

## 2017-07-29 ENCOUNTER — Encounter: Payer: Self-pay | Admitting: Sports Medicine

## 2017-07-29 ENCOUNTER — Ambulatory Visit (INDEPENDENT_AMBULATORY_CARE_PROVIDER_SITE_OTHER): Payer: BLUE CROSS/BLUE SHIELD

## 2017-07-29 ENCOUNTER — Ambulatory Visit (INDEPENDENT_AMBULATORY_CARE_PROVIDER_SITE_OTHER): Payer: BLUE CROSS/BLUE SHIELD | Admitting: Sports Medicine

## 2017-07-29 VITALS — BP 147/88 | HR 80 | Resp 18 | Wt 192.0 lb

## 2017-07-29 DIAGNOSIS — S0993XA Unspecified injury of face, initial encounter: Secondary | ICD-10-CM | POA: Diagnosis not present

## 2017-07-29 DIAGNOSIS — Z23 Encounter for immunization: Secondary | ICD-10-CM | POA: Diagnosis not present

## 2017-07-29 DIAGNOSIS — R51 Headache: Secondary | ICD-10-CM | POA: Diagnosis not present

## 2017-07-29 DIAGNOSIS — W208XXA Other cause of strike by thrown, projected or falling object, initial encounter: Secondary | ICD-10-CM

## 2017-07-29 NOTE — Assessment & Plan Note (Signed)
A large tree limb fell on his head and face this weekend. Suspect nasal bone fracture, left maxillary fracture. Does have some new visual changes in his left lateral visual field, question retinal detachment. I would like him to touch base with his eye surgeon within the next day or 2, and we are going to proceed with a stat CT of the maxillofacial bones. He can ice the area avoiding the eye for 20 minutes 3-4 times per day, and follow-up treatment will depend on results of the CT.

## 2017-07-29 NOTE — Progress Notes (Signed)
Subjective:    CC: Facial trauma  HPI: This weekend this pleasant 52 year old male was cutting down some branches, a large limb fell onto his face, impacting his nose, left cheek and causing scratches all down his face and arm.  He did not have any loss of consciousness, no dizziness, but simply a lot of pain and swelling, bruising.  He denies any malocclusion, did have some scotomata on his left lateral visual field that has persisted, denies any diplopia.  I reviewed the past medical history, family history, social history, surgical history, and allergies today and no changes were needed.  Please see the problem list section below in epic for further details.  Past Medical History: Past Medical History:  Diagnosis Date  . Alcoholic hepatitis   . Anxiety   . Asthma   . Gout 04/28/2015  . Hyperlipemia   . Hypertension   . Insomnia   . Rosacea   . Seasonal allergies    Past Surgical History: Past Surgical History:  Procedure Laterality Date  . BACK SURGERY    . FISSURECTOMY     Social History: Social History   Socioeconomic History  . Marital status: Single    Spouse name: Not on file  . Number of children: Not on file  . Years of education: Not on file  . Highest education level: Not on file  Occupational History  . Not on file  Social Needs  . Financial resource strain: Not on file  . Food insecurity:    Worry: Not on file    Inability: Not on file  . Transportation needs:    Medical: Not on file    Non-medical: Not on file  Tobacco Use  . Smoking status: Current Every Day Smoker    Packs/day: 0.25    Years: 25.00    Pack years: 6.25    Types: Cigarettes, Cigars  . Smokeless tobacco: Never Used  Substance and Sexual Activity  . Alcohol use: Yes  . Drug use: No  . Sexual activity: Not on file  Lifestyle  . Physical activity:    Days per week: Not on file    Minutes per session: Not on file  . Stress: Not on file  Relationships  . Social connections:    Talks on phone: Not on file    Gets together: Not on file    Attends religious service: Not on file    Active member of club or organization: Not on file    Attends meetings of clubs or organizations: Not on file    Relationship status: Not on file  Other Topics Concern  . Not on file  Social History Narrative  . Not on file   Family History: No family history on file. Allergies: Allergies  Allergen Reactions  . Lipitor [Atorvastatin] Other (See Comments)    Elevated LFTs  . Tylenol [Acetaminophen]    Medications: See med rec.  Review of Systems: No fevers, chills, night sweats, weight loss, chest pain, or shortness of breath.   Objective:    General: Well Developed, well nourished, and in no acute distress.  Neuro: Alert and oriented x3, extra-ocular muscles intact, sensation grossly intact.  HEENT: Normocephalic, pupils equal round reactive to light, neck supple, no masses, no lymphadenopathy, thyroid nonpalpable.  Visible bruising, swelling, particularly over the left orbital rim with a large periorbital ecchymosis, over the nose.  No septal hematoma visible, no battle sign, tender to palpation over his nasal bones as well as his left orbital  rim. Skin: Warm and dry, no rashes. Cardiac: Regular rate and rhythm, no murmurs rubs or gallops, no lower extremity edema.  Respiratory: Clear to auscultation bilaterally. Not using accessory muscles, speaking in full sentences.  CT of the maxillofacial bones shows some bone bruising in the orbital rim but no overt fractures.  Impression and Recommendations:    Facial trauma A large tree limb fell on his head and face this weekend. Suspect nasal bone fracture, left maxillary fracture. Does have some new visual changes in his left lateral visual field, question retinal detachment. I would like him to touch base with his eye surgeon within the next day or 2, and we are going to proceed with a stat CT of the maxillofacial bones. He  can ice the area avoiding the eye for 20 minutes 3-4 times per day, and follow-up treatment will depend on results of the CT. ___________________________________________ [name]. Benjamin Stain, M.D., ABFM., CAQSM. Primary Care and Sports Medicine [facility]  Adjunct Instructor of Family Medicine  [employer]

## 2017-08-01 ENCOUNTER — Telehealth: Payer: Self-pay | Admitting: Sports Medicine

## 2017-08-01 ENCOUNTER — Ambulatory Visit (INDEPENDENT_AMBULATORY_CARE_PROVIDER_SITE_OTHER): Payer: BLUE CROSS/BLUE SHIELD | Admitting: Sports Medicine

## 2017-08-01 ENCOUNTER — Encounter: Payer: Self-pay | Admitting: Sports Medicine

## 2017-08-01 VITALS — BP 132/75 | HR 81 | Ht 70.0 in | Wt 194.0 lb

## 2017-08-01 DIAGNOSIS — L719 Rosacea, unspecified: Secondary | ICD-10-CM | POA: Diagnosis not present

## 2017-08-01 DIAGNOSIS — M1A9XX0 Chronic gout, unspecified, without tophus (tophi): Secondary | ICD-10-CM | POA: Diagnosis not present

## 2017-08-01 DIAGNOSIS — N139 Obstructive and reflux uropathy, unspecified: Secondary | ICD-10-CM

## 2017-08-01 DIAGNOSIS — I10 Essential (primary) hypertension: Secondary | ICD-10-CM | POA: Diagnosis not present

## 2017-08-01 DIAGNOSIS — S0993XD Unspecified injury of face, subsequent encounter: Secondary | ICD-10-CM

## 2017-08-01 DIAGNOSIS — Z0000 Encounter for general adult medical examination without abnormal findings: Secondary | ICD-10-CM

## 2017-08-01 DIAGNOSIS — F411 Generalized anxiety disorder: Secondary | ICD-10-CM

## 2017-08-01 DIAGNOSIS — J4530 Mild persistent asthma, uncomplicated: Secondary | ICD-10-CM

## 2017-08-01 DIAGNOSIS — E781 Pure hyperglyceridemia: Secondary | ICD-10-CM

## 2017-08-01 MED ORDER — COLCHICINE 0.6 MG PO TABS: 0.6000 mg | ORAL_TABLET | Freq: Every day | ORAL | 3 refills | Status: DC | PRN

## 2017-08-01 MED ORDER — MONTELUKAST SODIUM 10 MG PO TABS: 10.0000 mg | ORAL_TABLET | Freq: Every day | ORAL | 3 refills | Status: DC

## 2017-08-01 MED ORDER — ALBUTEROL SULFATE HFA 108 (90 BASE) MCG/ACT IN AERS: INHALATION_SPRAY | RESPIRATORY_TRACT | 3 refills | Status: DC

## 2017-08-01 MED ORDER — IRBESARTAN 300 MG PO TABS: 300.0000 mg | ORAL_TABLET | Freq: Every day | ORAL | 3 refills | Status: DC

## 2017-08-01 MED ORDER — FLUTICASONE PROPIONATE 50 MCG/ACT NA SUSP: 1.0000 | Freq: Every day | NASAL | 3 refills | Status: DC

## 2017-08-01 MED ORDER — ALPRAZOLAM 0.25 MG PO TABS: 0.2500 mg | ORAL_TABLET | Freq: Two times a day (BID) | ORAL | 3 refills | Status: DC | PRN

## 2017-08-01 MED ORDER — EZETIMIBE 10 MG PO TABS: ORAL_TABLET | ORAL | 3 refills | Status: DC

## 2017-08-01 MED ORDER — FLUTICASONE-SALMETEROL 100-50 MCG/DOSE IN AEPB: 1.0000 | INHALATION_SPRAY | Freq: Two times a day (BID) | RESPIRATORY_TRACT | 3 refills | Status: DC

## 2017-08-01 MED ORDER — AMLODIPINE BESYLATE 5 MG PO TABS: 5.0000 mg | ORAL_TABLET | Freq: Every day | ORAL | 3 refills | Status: DC

## 2017-08-01 MED ORDER — TAMSULOSIN HCL 0.4 MG PO CAPS: 0.8000 mg | ORAL_CAPSULE | Freq: Every day | ORAL | 3 refills | Status: DC

## 2017-08-01 MED ORDER — ICOSAPENT ETHYL 1 G PO CAPS: 2.0000 | ORAL_CAPSULE | Freq: Two times a day (BID) | ORAL | 3 refills | Status: DC

## 2017-08-01 MED ORDER — LANSOPRAZOLE 30 MG PO CPDR: DELAYED_RELEASE_CAPSULE | ORAL | 3 refills | Status: DC

## 2017-08-01 MED ORDER — ALLOPURINOL 100 MG PO TABS: 200.0000 mg | ORAL_TABLET | Freq: Every day | ORAL | 3 refills | Status: DC

## 2017-08-01 MED ORDER — FENOFIBRATE 160 MG PO TABS: 160.0000 mg | ORAL_TABLET | Freq: Every day | ORAL | 3 refills | Status: DC

## 2017-08-01 MED ORDER — GABAPENTIN 100 MG PO CAPS: 100.0000 mg | ORAL_CAPSULE | Freq: Four times a day (QID) | ORAL | 3 refills | Status: DC | PRN

## 2017-08-01 MED ORDER — AZELAIC ACID 15 % EX GEL: CUTANEOUS | 3 refills | Status: DC

## 2017-08-01 NOTE — Assessment & Plan Note (Signed)
Unremarkable physical as above. Up-to-date on screening measures.

## 2017-08-01 NOTE — Assessment & Plan Note (Signed)
Adding topical Finacea. Has failed topical metronidazole in the past.

## 2017-08-01 NOTE — Telephone Encounter (Signed)
Approvedtoday  Effective from 08/01/2017 through 07/31/2018. Pharmacy notified.  

## 2017-08-01 NOTE — Assessment & Plan Note (Signed)
Significantly improved control on Vascepa, fenofibrate, Zetia.

## 2017-08-01 NOTE — Assessment & Plan Note (Signed)
Switch Flomax to morning, after 1 week if insufficient relief he will go to 2 pills daily. Still with several episodes of nocturia.

## 2017-08-01 NOTE — Assessment & Plan Note (Signed)
Fantastic improvements in pain and bruising on the face, CT of the maxillofacial bones was negative for fracture.

## 2017-08-01 NOTE — Assessment & Plan Note (Signed)
Controlled, no changes in plan.

## 2017-08-01 NOTE — Assessment & Plan Note (Signed)
Currently in a disagreement with his son, overall has good insight and knows this will improve.

## 2017-08-01 NOTE — Assessment & Plan Note (Signed)
Uric acid levels are appropriately controlled, feels pretty good.

## 2017-08-01 NOTE — Assessment & Plan Note (Signed)
Controlled with Advair, Singulair, no change in plan.

## 2017-08-01 NOTE — Progress Notes (Signed)
Subjective:    CC: Annual physical exam  HPI:  This is a pleasant 52 year old male here for his physical, he has several questions.  Obstructive uropathy: Not noticing much improvement with Flomax at bedtime.  No dysuria, perineal pain.  Fevers or chills.  Multiple medications: Would like to set his refills to coincide with one another, so he does not have to run to the pharmacy every month for something.  Gout: Doing well, uric acid levels are controlled.  Hypertension: Well-controlled.  Rosacea: Failure of topical metronidazole, agreeable to try Finacea.  Other medical problems: See below in problem list.  I reviewed the past medical history, family history, social history, surgical history, and allergies today and no changes were needed.  Please see the problem list section below in epic for further details.  Past Medical History: Past Medical History:  Diagnosis Date  . Alcoholic hepatitis   . Anxiety   . Asthma   . Gout 04/28/2015  . Hyperlipemia   . Hypertension   . Insomnia   . Rosacea   . Seasonal allergies    Past Surgical History: Past Surgical History:  Procedure Laterality Date  . BACK SURGERY    . FISSURECTOMY     Social History: Social History   Socioeconomic History  . Marital status: Single    Spouse name: Not on file  . Number of children: Not on file  . Years of education: Not on file  . Highest education level: Not on file  Occupational History  . Not on file  Social Needs  . Financial resource strain: Not on file  . Food insecurity:    Worry: Not on file    Inability: Not on file  . Transportation needs:    Medical: Not on file    Non-medical: Not on file  Tobacco Use  . Smoking status: Current Every Day Smoker    Packs/day: 0.25    Years: 25.00    Pack years: 6.25    Types: Cigarettes, Cigars  . Smokeless tobacco: Never Used  Substance and Sexual Activity  . Alcohol use: Yes  . Drug use: No  . Sexual activity: Not on file    Lifestyle  . Physical activity:    Days per week: Not on file    Minutes per session: Not on file  . Stress: Not on file  Relationships  . Social connections:    Talks on phone: Not on file    Gets together: Not on file    Attends religious service: Not on file    Active member of club or organization: Not on file    Attends meetings of clubs or organizations: Not on file    Relationship status: Not on file  Other Topics Concern  . Not on file  Social History Narrative  . Not on file   Family History: No family history on file. Allergies: Allergies  Allergen Reactions  . Lipitor [Atorvastatin] Other (See Comments)    Elevated LFTs  . Tylenol [Acetaminophen]    Medications: See med rec.  Review of Systems: No headache, visual changes, nausea, vomiting, diarrhea, constipation, dizziness, abdominal pain, skin rash, fevers, chills, night sweats, swollen lymph nodes, weight loss, chest pain, body aches, joint swelling, muscle aches, shortness of breath, mood changes, visual or auditory hallucinations.  Objective:    General: Well Developed, well nourished, and in no acute distress.  Neuro: Alert and oriented x3, extra-ocular muscles intact, sensation grossly intact. Cranial nerves II through XII are intact,  motor, sensory, and coordinative functions are all intact. HEENT: Normocephalic, atraumatic, pupils equal round reactive to light, neck supple, no masses, no lymphadenopathy, thyroid nonpalpable. Oropharynx, nasopharynx, external ear canals are unremarkable. Skin: Warm and dry, no rashes noted.  Cardiac: Regular rate and rhythm, no murmurs rubs or gallops.  Respiratory: Clear to auscultation bilaterally. Not using accessory muscles, speaking in full sentences.  Abdominal: Soft, nontender, nondistended, positive bowel sounds, no masses, no organomegaly.  Musculoskeletal: Shoulder, elbow, wrist, hip, knee, ankle stable, and with full range of motion.  Impression and  Recommendations:    The patient was counselled, risk factors were discussed, anticipatory guidance given.  Annual physical exam Unremarkable physical as above. Up-to-date on screening measures.  Gout Uric acid levels are appropriately controlled, feels pretty good.  Generalized anxiety disorder Currently in a disagreement with his son, overall has good insight and knows this will improve.  Essential hypertension Controlled, no changes in plan.  Asthma, mild persistent Controlled with Advair, Singulair, no change in plan.  Rosacea Adding topical Finacea. Has failed topical metronidazole in the past.  Hypertriglyceridemia Significantly improved control on Vascepa, fenofibrate, Zetia.  Obstructive uropathy Switch Flomax to morning, after 1 week if insufficient relief he will go to 2 pills daily. Still with several episodes of nocturia.  Facial trauma Fantastic improvements in pain and bruising on the face, CT of the maxillofacial bones was negative for fracture. ___________________________________________ [name]. Benjamin Stain, M.D., ABFM., CAQSM. Primary Care and Sports Medicine [facility]  Adjunct Instructor of Family Medicine  [employer]

## 2017-08-13 DIAGNOSIS — H33191 Other retinoschisis and retinal cysts, right eye: Secondary | ICD-10-CM | POA: Diagnosis not present

## 2017-08-13 DIAGNOSIS — H33012 Retinal detachment with single break, left eye: Secondary | ICD-10-CM | POA: Diagnosis not present

## 2017-08-13 DIAGNOSIS — H33192 Other retinoschisis and retinal cysts, left eye: Secondary | ICD-10-CM | POA: Diagnosis not present

## 2017-08-13 DIAGNOSIS — H35711 Central serous chorioretinopathy, right eye: Secondary | ICD-10-CM | POA: Diagnosis not present

## 2017-08-13 DIAGNOSIS — H43392 Other vitreous opacities, left eye: Secondary | ICD-10-CM | POA: Diagnosis not present

## 2017-08-15 DIAGNOSIS — M542 Cervicalgia: Secondary | ICD-10-CM | POA: Diagnosis not present

## 2017-08-15 DIAGNOSIS — M546 Pain in thoracic spine: Secondary | ICD-10-CM | POA: Diagnosis not present

## 2017-08-15 DIAGNOSIS — M5441 Lumbago with sciatica, right side: Secondary | ICD-10-CM | POA: Diagnosis not present

## 2017-08-27 DIAGNOSIS — H33191 Other retinoschisis and retinal cysts, right eye: Secondary | ICD-10-CM | POA: Diagnosis not present

## 2017-08-27 DIAGNOSIS — H35711 Central serous chorioretinopathy, right eye: Secondary | ICD-10-CM | POA: Diagnosis not present

## 2017-08-27 DIAGNOSIS — H33192 Other retinoschisis and retinal cysts, left eye: Secondary | ICD-10-CM | POA: Diagnosis not present

## 2017-08-27 DIAGNOSIS — H11433 Conjunctival hyperemia, bilateral: Secondary | ICD-10-CM | POA: Diagnosis not present

## 2017-09-11 ENCOUNTER — Ambulatory Visit (INDEPENDENT_AMBULATORY_CARE_PROVIDER_SITE_OTHER): Payer: BLUE CROSS/BLUE SHIELD | Admitting: Sports Medicine

## 2017-09-11 ENCOUNTER — Encounter: Payer: Self-pay | Admitting: Sports Medicine

## 2017-09-11 DIAGNOSIS — M7712 Lateral epicondylitis, left elbow: Secondary | ICD-10-CM

## 2017-09-11 DIAGNOSIS — M7711 Lateral epicondylitis, right elbow: Secondary | ICD-10-CM | POA: Diagnosis not present

## 2017-09-11 DIAGNOSIS — J029 Acute pharyngitis, unspecified: Secondary | ICD-10-CM | POA: Diagnosis not present

## 2017-09-11 NOTE — Assessment & Plan Note (Signed)
Centor score = 1, conservative and symptomatic treatment alone.   Return if no better in a week or 2.

## 2017-09-11 NOTE — Assessment & Plan Note (Signed)
Left side did well last year, right side injected today.

## 2017-09-11 NOTE — Progress Notes (Signed)
Subjective:    CC: Sore throat, elbow pain  HPI: Elbow pain: Right-sided, moderate, persistent, localized without radiation, worse with gripping motions.  Needs to play golf this week and, desires interventional treatment today.  Sore throat: Present for a week, no fevers, chills, GI symptoms.  I reviewed the past medical history, family history, social history, surgical history, and allergies today and no changes were needed.  Please see the problem list section below in epic for further details.  Past Medical History: Past Medical History:  Diagnosis Date  . Alcoholic hepatitis   . Anxiety   . Asthma   . Gout 04/28/2015  . Hyperlipemia   . Hypertension   . Insomnia   . Rosacea   . Seasonal allergies    Past Surgical History: Past Surgical History:  Procedure Laterality Date  . BACK SURGERY    . FISSURECTOMY     Social History: Social History   Socioeconomic History  . Marital status: Single    Spouse name: Not on file  . Number of children: Not on file  . Years of education: Not on file  . Highest education level: Not on file  Occupational History  . Not on file  Social Needs  . Financial resource strain: Not on file  . Food insecurity:    Worry: Not on file    Inability: Not on file  . Transportation needs:    Medical: Not on file    Non-medical: Not on file  Tobacco Use  . Smoking status: Current Every Day Smoker    Packs/day: 0.25    Years: 25.00    Pack years: 6.25    Types: Cigarettes, Cigars  . Smokeless tobacco: Never Used  Substance and Sexual Activity  . Alcohol use: Yes  . Drug use: No  . Sexual activity: Not on file  Lifestyle  . Physical activity:    Days per week: Not on file    Minutes per session: Not on file  . Stress: Not on file  Relationships  . Social connections:    Talks on phone: Not on file    Gets together: Not on file    Attends religious service: Not on file    Active member of club or organization: Not on file   Attends meetings of clubs or organizations: Not on file    Relationship status: Not on file  Other Topics Concern  . Not on file  Social History Narrative  . Not on file   Family History: No family history on file. Allergies: Allergies  Allergen Reactions  . Lipitor [Atorvastatin] Other (See Comments)    Elevated LFTs  . Tylenol [Acetaminophen]    Medications: See med rec.  Review of Systems: No fevers, chills, night sweats, weight loss, chest pain, or shortness of breath.   Objective:    General: Well Developed, well nourished, and in no acute distress.  Neuro: Alert and oriented x3, extra-ocular muscles intact, sensation grossly intact.  HEENT: Normocephalic, atraumatic, pupils equal round reactive to light, neck supple, no masses, no lymphadenopathy, thyroid nonpalpable.  Only minimal trace pharyngeal erythema, no exudates or tonsillitis. Skin: Warm and dry, no rashes. Cardiac: Regular rate and rhythm, no murmurs rubs or gallops, no lower extremity edema.  Respiratory: Clear to auscultation bilaterally. Not using accessory muscles, speaking in full sentences. Right elbow: Unremarkable to inspection. Range of motion full pronation, supination, flexion, extension. Strength is full to all of the above directions Stable to varus, valgus stress. Negative moving valgus  stress test. Tender to palpation at the common extensor tendon origin Ulnar nerve does not sublux. Negative cubital tunnel Tinel's.  Procedure: Real-time Ultrasound Guided Injection of right common extensor tendon origin Device: GE Logiq E  Verbal informed consent obtained.  Time-out conducted.  Noted no overlying erythema, induration, or other signs of local infection.  Skin prepped in a sterile fashion.  Local anesthesia: Topical Ethyl chloride.  With sterile technique and under real time ultrasound guidance: 1 cc Kenalog 40, 1 cc lidocaine, 1 cc bupivacaine injected easily. Completed without difficulty    Pain immediately resolved suggesting accurate placement of the medication.  Advised to call if fevers/chills, erythema, induration, drainage, or persistent bleeding.  Images permanently stored and available for review in the ultrasound unit.  Impression: Technically successful ultrasound guided injection.  Impression and Recommendations:    Lateral epicondylitis of both elbows Left side did well last year, right side injected today.  Viral pharyngitis Centor score = 1, conservative and symptomatic treatment alone.   Return if no better in a week or 2. ___________________________________________ Gabriel Wong. Gabriel Wong, M.D., ABFM., CAQSM. Primary Care and Sports Medicine [facility]  Adjunct Instructor of Family Medicine  [employer]

## 2017-10-31 DIAGNOSIS — M5441 Lumbago with sciatica, right side: Secondary | ICD-10-CM | POA: Diagnosis not present

## 2017-10-31 DIAGNOSIS — M546 Pain in thoracic spine: Secondary | ICD-10-CM | POA: Diagnosis not present

## 2017-10-31 DIAGNOSIS — M542 Cervicalgia: Secondary | ICD-10-CM | POA: Diagnosis not present

## 2017-11-12 ENCOUNTER — Other Ambulatory Visit: Payer: Self-pay | Admitting: Sports Medicine

## 2017-11-28 DIAGNOSIS — M546 Pain in thoracic spine: Secondary | ICD-10-CM | POA: Diagnosis not present

## 2017-11-28 DIAGNOSIS — M542 Cervicalgia: Secondary | ICD-10-CM | POA: Diagnosis not present

## 2017-11-28 DIAGNOSIS — M5441 Lumbago with sciatica, right side: Secondary | ICD-10-CM | POA: Diagnosis not present

## 2017-12-06 ENCOUNTER — Telehealth: Payer: Self-pay | Admitting: Sports Medicine

## 2017-12-06 NOTE — Telephone Encounter (Signed)
PT requested Zithromax(Zpack) to be sent in to his pharmacy.Having previous symptoms from last year  Please advise.

## 2017-12-09 ENCOUNTER — Encounter: Payer: Self-pay | Admitting: Family Medicine

## 2017-12-09 ENCOUNTER — Ambulatory Visit (INDEPENDENT_AMBULATORY_CARE_PROVIDER_SITE_OTHER): Payer: BLUE CROSS/BLUE SHIELD | Admitting: Family Medicine

## 2017-12-09 VITALS — BP 124/67 | HR 80 | Ht 70.0 in | Wt 194.0 lb

## 2017-12-09 DIAGNOSIS — R51 Headache: Secondary | ICD-10-CM | POA: Diagnosis not present

## 2017-12-09 DIAGNOSIS — J0101 Acute recurrent maxillary sinusitis: Secondary | ICD-10-CM | POA: Diagnosis not present

## 2017-12-09 DIAGNOSIS — R519 Headache, unspecified: Secondary | ICD-10-CM

## 2017-12-09 MED ORDER — AZITHROMYCIN 250 MG PO TABS: 250.0000 mg | ORAL_TABLET | Freq: Every day | ORAL | 0 refills | Status: DC

## 2017-12-09 MED ORDER — BENZONATATE 200 MG PO CAPS: 200.0000 mg | ORAL_CAPSULE | Freq: Three times a day (TID) | ORAL | 0 refills | Status: DC | PRN

## 2017-12-09 NOTE — Patient Instructions (Signed)
Thank you for coming in today. For headache I do not think you likely had a concussion.  We will continue to follow. Let us know if not better or worsening.  OK to use ibuprofen for pain as needed.   Azithromycin for sinusitis Tessalon pears for cough as needed.   Call or go to the emergency room if you get worse, have trouble breathing, have chest pains, or palpitations.   Go to the emergency room if your headache becomes excruciating or you have weakness or numbness or uncontrolled vomiting.    Sinusitis, Adult Sinusitis is soreness and inflammation of your sinuses. Sinuses are hollow spaces in the bones around your face. They are located:  Around your eyes.  In the middle of your forehead.  Behind your nose.  In your cheekbones.  Your sinuses and nasal passages are lined with a stringy fluid (mucus). Mucus normally drains out of your sinuses. When your nasal tissues get inflamed or swollen, the mucus can get trapped or blocked so air cannot flow through your sinuses. This lets bacteria, viruses, and funguses grow, and that leads to infection. Follow these instructions at home: Medicines  Take, use, or apply over-the-counter and prescription medicines only as told by your doctor. These may include nasal sprays.  If you were prescribed an antibiotic medicine, take it as told by your doctor. Do not stop taking the antibiotic even if you start to feel better. Hydrate and Humidify  Drink enough water to keep your pee (urine) clear or pale yellow.  Use a cool mist humidifier to keep the humidity level in your home above 50%.  Breathe in steam for 10-15 minutes, 3-4 times a day or as told by your doctor. You can do this in the bathroom while a hot shower is running.  Try not to spend time in cool or dry air. Rest  Rest as much as possible.  Sleep with your head raised (elevated).  Make sure to get enough sleep each night. General instructions  Put a warm, moist washcloth  on your face 3-4 times a day or as told by your doctor. This will help with discomfort.  Wash your hands often with soap and water. If there is no soap and water, use hand sanitizer.  Do not smoke. Avoid being around people who are smoking (secondhand smoke).  Keep all follow-up visits as told by your doctor. This is important. Contact a doctor if:  You have a fever.  Your symptoms get worse.  Your symptoms do not get better within 10 days. Get help right away if:  You have a very bad headache.  You cannot stop throwing up (vomiting).  You have pain or swelling around your face or eyes.  You have trouble seeing.  You feel confused.  Your neck is stiff.  You have trouble breathing. This information is not intended to replace advice given to you by your health care provider. Make sure you discuss any questions you have with your health care provider. Document Released: 09/19/2007 Document Revised: 11/27/2015 Document Reviewed: 01/26/2015 Elsevier Interactive Patient Education  [employer]2018 Elsevier Inc.

## 2017-12-09 NOTE — Progress Notes (Signed)
Gabriel Wong is a 52 y.o. male who presents to [facility]: Primary Care Sports Medicine today for headache.   Gabriel Wong hit his head on Saturday after falling onto the concrete at the pool.  He says he was bending over and feel forward hitting the front of his head on the concrete.  He did not experience any loss of consciousness or dizziness. He denies nausea/vomiting or changes in vision or hearing. He says he does have a past history of concussion. He had a concussion 5 years ago after running into a wall. He says he has a mild headache over the area that he hit but is otherwise feeling well.   Congestion: he also is complaining of sinus congestion today. The symptoms have been persistent for 18 days. He says that he always gets congested at this tie of year and he uses azithromycin which usually works for him.    ROS as above:  Exam:  BP 124/67   Pulse 80   Ht 5\' 10"  (1.778 m)   Wt 194 lb (88 kg)   BMI 27.84 kg/m  Wt Readings from Last 5 Encounters:  12/09/17 194 lb (88 kg)  09/11/17 190 lb (86.2 kg)  08/01/17 194 lb (88 kg)  07/29/17 192 lb (87.1 kg)  06/06/17 191 lb (86.6 kg)    Gen: Well NAD HEENT: 2 cm swollen erythematous lump on right side of anterior forehead. EOMI,  MMM.  Clear nasal discharge with inflamed nasal turbinates bilaterally. Lungs: Normal work of breathing. CTABL Heart: RRR no MRG Abd: NABS, Soft. Nondistended, Nontender Exts: Brisk capillary refill, warm and well perfused.  Neuro: see SCAT5 below  Coordination balance and gait.  Normal speech and thought process.  SCAT5: Symptom severity score:  7/132 Cognitive assessment: 5/5 Immediate memory score: 15/15 Concentration score:  5/5 Neck exam:    normal Balance exam:   normal Coordination exam:  normal Delayed recall score  4/5   Assessment and Plan: 52 y.o. male with headache following a fall. He  did not lose consciousness and has no associated symptoms.  His SCAT5 is negative for concussion. He does not require CT scan or other workup at this time. Suspicion for concussion or intracranial bleed is very low. The plan is to keep an eye on his symptoms and return to clinic if there are any changes.   Congestion: he has sinus congestion symptoms that he always experiences this time of year.  Symptoms ongoing now for 19 days which is a bit long for viral process.  Plan to use azithromycin antibiotics.  He has used azithromycin in the past and it has worked very well for him. We will try Zithromax 5 days.  Additionally use Tessalon Perles for cough suppression.  No orders of the defined types were placed in this encounter.  Meds ordered this encounter  Medications  . azithromycin (ZITHROMAX) 250 MG tablet    Sig: Take 1 tablet (250 mg total) by mouth daily. Take first 2 tablets together, then 1 every day until finished.    Dispense:  6 tablet    Refill:  0  . benzonatate (TESSALON) 200 MG capsule    Sig: Take 1 capsule (200 mg total) by mouth 3 (three) times daily as needed for cough.    Dispense:  45 capsule    Refill:  0     Historical information moved to improve visibility of documentation.  Past Medical History:  Diagnosis  Date  . Alcoholic hepatitis   . Anxiety   . Asthma   . Gout 04/28/2015  . Hyperlipemia   . Hypertension   . Insomnia   . Rosacea   . Seasonal allergies    Past Surgical History:  Procedure Laterality Date  . BACK SURGERY    . FISSURECTOMY     Social History   Tobacco Use  . Smoking status: Current Every Day Smoker    Packs/day: 0.25    Years: 25.00    Pack years: 6.25    Types: Cigarettes, Cigars  . Smokeless tobacco: Never Used  Substance Use Topics  . Alcohol use: Yes   family history is not on file.  Medications: Current Outpatient Medications  Medication Sig Dispense Refill  . albuterol (VENTOLIN HFA) 108 (90 Base) MCG/ACT inhaler  INHALE 2 PUFFS INTO THE LUNGS EVERY 4 TO 6 HOURS ONLY AS NEEDED FOR SHORTNESS OF BREATH OR WHEEZING. 18 Inhaler 3  . allopurinol (ZYLOPRIM) 100 MG tablet Take 2 tablets (200 mg total) by mouth daily. 180 tablet 3  . ALPRAZolam (XANAX) 0.25 MG tablet Take 1 tablet (0.25 mg total) by mouth 2 (two) times daily as needed. 180 tablet 3  . amLODipine (NORVASC) 5 MG tablet Take 1 tablet (5 mg total) by mouth daily. 90 tablet 3  . Azelaic Acid (FINACEA) 15 % cream After skin is thoroughly washed and patted dry, gently massage a thin film of azelaic acid cream into the affected area 1-2x daily. 50 g 3  . colchicine 0.6 MG tablet Take 1 tablet (0.6 mg total) by mouth daily as needed. 90 tablet 3  . Diclofenac Sodium 2 % SOLN Place 2 sprays onto the skin 2 (two) times daily. 1 Bottle 11  . eplerenone (INSPRA) 25 MG tablet TAKE 1 TABLET (25 MG TOTAL) BY MOUTH DAILY. 90 tablet 2  . ezetimibe (ZETIA) 10 MG tablet TAKE 1 TABLET (10 MG TOTAL) BY MOUTH DAILY. 90 tablet 3  . fenofibrate 160 MG tablet Take 1 tablet (160 mg total) by mouth daily. 90 tablet 3  . fluticasone (FLONASE) 50 MCG/ACT nasal spray Place 1 spray into both nostrils daily. 48 g 3  . Fluticasone-Salmeterol (ADVAIR DISKUS) 100-50 MCG/DOSE AEPB Inhale 1 puff into the lungs 2 (two) times daily. NEEDS APPOINTMENT FOR FUTURE REFILLS. 180 each 3  . gabapentin (NEURONTIN) 100 MG capsule Take 1 capsule (100 mg total) by mouth 4 (four) times daily as needed (nerve pain). 360 capsule 3  . Icosapent Ethyl (VASCEPA) 1 g CAPS Take 2 capsules (2 g total) by mouth 2 (two) times daily. 360 capsule 3  . irbesartan (AVAPRO) 300 MG tablet Take 1 tablet (300 mg total) by mouth daily. 90 tablet 3  . lansoprazole (PREVACID) 30 MG capsule TAKE ONE CAPSULE BY MOUTH TWICE A DAY BEFORE A MEAL 180 capsule 3  . montelukast (SINGULAIR) 10 MG tablet Take 1 tablet (10 mg total) by mouth at bedtime. 90 tablet 3  . tamsulosin (FLOMAX) 0.4 MG CAPS capsule TAKE 2 CAPSULES (0.8 MG  TOTAL) BY MOUTH DAILY AFTER BREAKFAST. 180 capsule 1  . azithromycin (ZITHROMAX) 250 MG tablet Take 1 tablet (250 mg total) by mouth daily. Take first 2 tablets together, then 1 every day until finished. 6 tablet 0  . benzonatate (TESSALON) 200 MG capsule Take 1 capsule (200 mg total) by mouth 3 (three) times daily as needed for cough. 45 capsule 0   No current facility-administered medications for this visit.  Allergies  Allergen Reactions  . Lipitor [Atorvastatin] Other (See Comments)    Elevated LFTs  . Tylenol [Acetaminophen]      Discussed warning signs or symptoms. Please see discharge instructions. Patient expresses understanding.   I personally was present and performed or re-performed the history, physical exam and medical decision-making activities of this service and have verified that the service and findings are accurately documented in the student's note. ___________________________________________ Clementeen GrahamEvan Corey M.D., ABFM., CAQSM. Primary Care and Sports Medicine Adjunct Instructor of Family Medicine  [employer]

## 2017-12-09 NOTE — Telephone Encounter (Signed)
Patient will need an appointment.  

## 2017-12-10 ENCOUNTER — Telehealth: Payer: Self-pay

## 2017-12-10 NOTE — Telephone Encounter (Signed)
Patient advised.

## 2017-12-10 NOTE — Telephone Encounter (Signed)
That is unlikely. Recommend OTC medicine if he is concerned about benzonatate

## 2017-12-10 NOTE — Telephone Encounter (Signed)
Gabriel Wong called and states he was told by his girlfriend that if he takes the benzonatate with to much water he will get pneumonia. Please advise.

## 2017-12-24 DIAGNOSIS — H35721 Serous detachment of retinal pigment epithelium, right eye: Secondary | ICD-10-CM | POA: Diagnosis not present

## 2017-12-24 DIAGNOSIS — H31091 Other chorioretinal scars, right eye: Secondary | ICD-10-CM | POA: Diagnosis not present

## 2017-12-24 DIAGNOSIS — H33192 Other retinoschisis and retinal cysts, left eye: Secondary | ICD-10-CM | POA: Diagnosis not present

## 2017-12-24 DIAGNOSIS — H33191 Other retinoschisis and retinal cysts, right eye: Secondary | ICD-10-CM | POA: Diagnosis not present

## 2018-01-20 ENCOUNTER — Other Ambulatory Visit: Payer: Self-pay | Admitting: Sports Medicine

## 2018-01-21 DIAGNOSIS — L570 Actinic keratosis: Secondary | ICD-10-CM | POA: Diagnosis not present

## 2018-01-21 DIAGNOSIS — L821 Other seborrheic keratosis: Secondary | ICD-10-CM | POA: Diagnosis not present

## 2018-01-21 DIAGNOSIS — L408 Other psoriasis: Secondary | ICD-10-CM | POA: Diagnosis not present

## 2018-02-13 ENCOUNTER — Ambulatory Visit: Payer: BLUE CROSS/BLUE SHIELD

## 2018-02-20 ENCOUNTER — Ambulatory Visit (INDEPENDENT_AMBULATORY_CARE_PROVIDER_SITE_OTHER): Payer: BLUE CROSS/BLUE SHIELD | Admitting: Sports Medicine

## 2018-02-20 DIAGNOSIS — Z23 Encounter for immunization: Secondary | ICD-10-CM | POA: Diagnosis not present

## 2018-02-21 ENCOUNTER — Telehealth: Payer: Self-pay

## 2018-02-21 ENCOUNTER — Other Ambulatory Visit: Payer: Self-pay

## 2018-02-21 MED ORDER — VALSARTAN 320 MG PO TABS: 320.0000 mg | ORAL_TABLET | Freq: Every day | ORAL | 3 refills | Status: DC

## 2018-02-21 MED ORDER — ALBUTEROL SULFATE HFA 108 (90 BASE) MCG/ACT IN AERS: INHALATION_SPRAY | RESPIRATORY_TRACT | 1 refills | Status: DC

## 2018-02-21 NOTE — Telephone Encounter (Signed)
Requesting 90 day supply.

## 2018-02-21 NOTE — Telephone Encounter (Signed)
[facility] called and states the Irbesartan is on back order until December. Please advise.

## 2018-02-21 NOTE — Telephone Encounter (Signed)
We can use valsartan in the meantime, calling in 320 mg.

## 2018-02-25 NOTE — Telephone Encounter (Signed)
Patient advised of recommendations.  

## 2018-03-25 ENCOUNTER — Other Ambulatory Visit: Payer: Self-pay | Admitting: Sports Medicine

## 2018-03-27 DIAGNOSIS — M542 Cervicalgia: Secondary | ICD-10-CM | POA: Diagnosis not present

## 2018-03-27 DIAGNOSIS — M546 Pain in thoracic spine: Secondary | ICD-10-CM | POA: Diagnosis not present

## 2018-03-27 DIAGNOSIS — M5441 Lumbago with sciatica, right side: Secondary | ICD-10-CM | POA: Diagnosis not present

## 2018-04-19 ENCOUNTER — Emergency Department (INDEPENDENT_AMBULATORY_CARE_PROVIDER_SITE_OTHER)
Admission: EM | Admit: 2018-04-19 | Discharge: 2018-04-19 | Disposition: A | Discharge status: Home / Self Care | Payer: BLUE CROSS/BLUE SHIELD | Source: Home / Self Care | Attending: Internal Medicine | Admitting: Internal Medicine

## 2018-04-19 ENCOUNTER — Other Ambulatory Visit: Payer: Self-pay

## 2018-04-19 ENCOUNTER — Encounter: Payer: Self-pay | Admitting: Emergency Medicine

## 2018-04-19 DIAGNOSIS — J200 Acute bronchitis due to Mycoplasma pneumoniae: Secondary | ICD-10-CM | POA: Diagnosis not present

## 2018-04-19 MED ORDER — AZITHROMYCIN 250 MG PO TABS: 250.0000 mg | ORAL_TABLET | Freq: Every day | ORAL | 0 refills | Status: DC

## 2018-04-19 NOTE — ED Provider Notes (Signed)
[facility]    CSN: [id number] Arrival date & time: 04/19/18  1546     History   Chief Complaint Chief Complaint  Patient presents with  . Cough    HPI Gabriel Wong Age is a 53 y.o. male.   53 yo male with past medical history significant for asthma presents to urgent care complaining of cough.  It is non-productive.  He denies SOB or fever. He admits this happens around this time every year. Also admits to some nasal drainage.       Past Medical History:  Diagnosis Date  . Alcoholic hepatitis   . Anxiety   . Asthma   . Gout 04/28/2015  . Hyperlipemia   . Hypertension   . Insomnia   . Rosacea   . Seasonal allergies     Patient Active Problem List   Diagnosis Date Noted  . Viral pharyngitis 09/11/2017  . Facial trauma 07/29/2017  . Obstructive uropathy 06/06/2017  . Polyarthralgia 06/06/2017  . Annual physical exam 07/31/2016  . Lateral epicondylitis of both elbows 07/31/2016  . Generalized anxiety disorder 04/28/2015  . 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 Gout 04/28/2015  . Hypertriglyceridemia 04/28/2015  . Essential hypertension 04/28/2015  . Asthma, mild persistent 04/28/2015  . Spinal stenosis of lumbar region 04/28/2015  . Alcoholic hepatitis without ascites 04/28/2015  . Rosacea 04/28/2015    Past Surgical History:  Procedure Laterality Date  . BACK SURGERY    . FISSURECTOMY         Home Medications    Prior to Admission medications   Medication Sig Start Date End Date Taking? Authorizing Provider  albuterol (VENTOLIN HFA) 108 (90 Base) MCG/ACT inhaler INHALE 2 PUFFS INTO THE LUNGS EVERY 4 TO 6 HOURS ONLY AS NEEDED FOR SHORTNESS OF BREATH OR WHEEZING. 03/25/18  Yes Monica Bectonhekkekandam, Thomas J, MD  allopurinol (ZYLOPRIM) 100 MG tablet Take 2 tablets (200 mg total) by mouth daily. 08/01/17  Yes Monica Bectonhekkekandam, Thomas J, MD  ALPRAZolam Prudy Feeler(XANAX) 0.25 MG tablet Take 1 tablet (0.25 mg total) by mouth 2 (two) times daily as needed. 08/01/17  Yes  Monica Bectonhekkekandam, Thomas J, MD  amLODipine (NORVASC) 5 MG tablet Take 1 tablet (5 mg total) by mouth daily. 08/01/17  Yes Monica Bectonhekkekandam, Thomas J, MD  Azelaic Acid (FINACEA) 15 % cream After skin is thoroughly washed and patted dry, gently massage a thin film of azelaic acid cream into the affected area 1-2x daily. 08/01/17  Yes Monica Bectonhekkekandam, Thomas J, MD  colchicine 0.6 MG tablet Take 1 tablet (0.6 mg total) by mouth daily as needed. 08/01/17  Yes Monica Bectonhekkekandam, Thomas J, MD  eplerenone (INSPRA) 25 MG tablet TAKE 1 TABLET (25 MG TOTAL) BY MOUTH DAILY. 01/20/18  Yes Monica Bectonhekkekandam, Thomas J, MD  ezetimibe (ZETIA) 10 MG tablet TAKE 1 TABLET (10 MG TOTAL) BY MOUTH DAILY. 08/01/17  Yes Monica Bectonhekkekandam, Thomas J, MD  fenofibrate 160 MG tablet Take 1 tablet (160 mg total) by mouth daily. 08/01/17  Yes Monica Bectonhekkekandam, Thomas J, MD  fluticasone (FLONASE) 50 MCG/ACT nasal spray Place 1 spray into both nostrils daily. 08/01/17  Yes Monica Bectonhekkekandam, Thomas J, MD  Fluticasone-Salmeterol (ADVAIR DISKUS) 100-50 MCG/DOSE AEPB Inhale 1 puff into the lungs 2 (two) times daily. NEEDS APPOINTMENT FOR FUTURE REFILLS. 08/01/17  Yes Monica Bectonhekkekandam, Thomas J, MD  Icosapent Ethyl (VASCEPA) 1 g CAPS Take 2 capsules (2 g total) by mouth 2 (two) times daily. 08/01/17  Yes Monica Bectonhekkekandam, Thomas J, MD  lansoprazole (PREVACID) 30 MG capsule TAKE ONE CAPSULE BY MOUTH  TWICE A DAY BEFORE A MEAL 08/01/17  Yes Monica Becton, MD  montelukast (SINGULAIR) 10 MG tablet Take 1 tablet (10 mg total) by mouth at bedtime. 08/01/17  Yes Monica Becton, MD  valsartan (DIOVAN) 320 MG tablet Take 1 tablet (320 mg total) by mouth daily. 02/21/18  Yes Monica Becton, MD  azithromycin (ZITHROMAX) 250 MG tablet Take 1 tablet (250 mg total) by mouth daily. Take first 2 tablets together, then 1 every day until finished. 04/19/18   Arnaldo Natal, MD    Family History History reviewed. No pertinent family history.  Social History Social History    Tobacco Use  . Smoking status: Current Every Day Smoker    Packs/day: 0.25    Years: 25.00    Pack years: 6.25    Types: Cigarettes, Cigars  . Smokeless tobacco: Never Used  Substance Use Topics  . Alcohol use: Yes  . Drug use: No     Allergies   Lipitor [atorvastatin] and Tylenol [acetaminophen]   Review of Systems Review of Systems  Constitutional: Negative for chills and fever.  HENT: Negative for sore throat and tinnitus.   Eyes: Negative for redness.  Respiratory: Positive for cough. Negative for shortness of breath.   Cardiovascular: Negative for chest pain and palpitations.  Gastrointestinal: Negative for abdominal pain, diarrhea, nausea and vomiting.  Genitourinary: Negative for dysuria, frequency and urgency.  Musculoskeletal: Negative for myalgias.  Skin: Negative for rash.       No lesions  Neurological: Negative for weakness.  Hematological: Does not bruise/bleed easily.  Psychiatric/Behavioral: Negative for suicidal ideas.     Physical Exam Triage Vital Signs ED Triage Vitals  Enc Vitals Group     BP 04/19/18 1610 135/76     Pulse Rate 04/19/18 1610 90     Resp --      Temp 04/19/18 1610 99 F (37.2 C)     Temp Source 04/19/18 1610 Oral     SpO2 04/19/18 1610 99 %     Weight 04/19/18 1612 196 lb (88.9 kg)     Height 04/19/18 1612 5\' 10"  (1.778 m)     Head Circumference --      Peak Flow --      Pain Score 04/19/18 1611 7     Pain Loc --      Pain Edu? --      Excl. in GC? --    No data found.  Updated Vital Signs BP 135/76 (BP Location: Right Arm)   Pulse 90   Temp 99 F (37.2 C) (Oral)   Ht 5\' 10"  (1.778 m)   Wt 88.9 kg   SpO2 99%   BMI 28.12 kg/m   Visual Acuity Right Eye Distance:   Left Eye Distance:   Bilateral Distance:    Right Eye Near:   Left Eye Near:    Bilateral Near:     Physical Exam Constitutional:      General: He is not in acute distress.    Appearance: He is well-developed.  HENT:     Head:  Normocephalic and atraumatic.  Eyes:     General: No scleral icterus.    Conjunctiva/sclera: Conjunctivae normal.     Pupils: Pupils are equal, round, and reactive to light.  Neck:     Musculoskeletal: Normal range of motion and neck supple.     Thyroid: No thyromegaly.     Vascular: No JVD.     Trachea: No tracheal deviation.  Cardiovascular:  Rate and Rhythm: Normal rate and regular rhythm.     Heart sounds: Normal heart sounds. No murmur. No friction rub. No gallop.   Pulmonary:     Effort: Pulmonary effort is normal. No respiratory distress.     Breath sounds: Normal breath sounds.  Abdominal:     General: Bowel sounds are normal. There is no distension.     Palpations: Abdomen is soft.     Tenderness: There is no abdominal tenderness.  Musculoskeletal: Normal range of motion.  Lymphadenopathy:     Cervical: No cervical adenopathy.  Skin:    General: Skin is warm and dry.     Findings: No erythema or rash.  Neurological:     Mental Status: He is alert and oriented to person, place, and time.     Cranial Nerves: No cranial nerve deficit.  Psychiatric:        Behavior: Behavior normal.        Thought Content: Thought content normal.        Judgment: Judgment normal.      UC Treatments / Results  Labs (all labs ordered are listed, but only abnormal results are displayed) Labs Reviewed - No data to display  EKG None  Radiology No results found.  Procedures Procedures (including critical care time)  Medications Ordered in UC Medications - No data to display  Initial Impression / Assessment and Plan / UC Course  I have reviewed the triage vital signs and the nursing notes.  Pertinent labs & imaging results that were available during my care of the patient were reviewed by me and considered in my medical decision making (see chart for details).     Bronchitic cough. States that responds well to AZM and prevents asthma from worsening.  Rx AZM   Final  Clinical Impressions(s) / UC Diagnoses   Final diagnoses:  Acute bronchitis due to Mycoplasma pneumoniae   Discharge Instructions   None    ED Prescriptions    Medication Sig Dispense Auth. Provider   azithromycin (ZITHROMAX) 250 MG tablet Take 1 tablet (250 mg total) by mouth daily. Take first 2 tablets together, then 1 every day until finished. 6 tablet Arnaldo Nataliamond, Kalifa Cadden S, MD     Controlled Substance Prescriptions [place] Controlled Substance Registry consulted? Not Applicable   Arnaldo Nataliamond, Tracie Lindbloom S, MD 04/19/18 [id number]

## 2018-04-19 NOTE — ED Triage Notes (Signed)
Presents with dry, non-productive cough since yesterday, states has simul ar symptoms 2-3 times a year.

## 2018-04-30 ENCOUNTER — Ambulatory Visit (INDEPENDENT_AMBULATORY_CARE_PROVIDER_SITE_OTHER): Payer: BLUE CROSS/BLUE SHIELD | Admitting: Family Medicine

## 2018-04-30 ENCOUNTER — Encounter: Payer: Self-pay | Admitting: Family Medicine

## 2018-04-30 VITALS — BP 127/80 | HR 72 | Ht 70.0 in | Wt 192.0 lb

## 2018-04-30 DIAGNOSIS — L719 Rosacea, unspecified: Secondary | ICD-10-CM | POA: Diagnosis not present

## 2018-04-30 DIAGNOSIS — R350 Frequency of micturition: Secondary | ICD-10-CM | POA: Diagnosis not present

## 2018-04-30 DIAGNOSIS — R109 Unspecified abdominal pain: Secondary | ICD-10-CM | POA: Diagnosis not present

## 2018-04-30 LAB — POCT URINALYSIS DIPSTICK
GLUCOSE UA: NEGATIVE
KETONES UA: NEGATIVE
Leukocytes, UA: NEGATIVE
Nitrite, UA: NEGATIVE
Protein, UA: NEGATIVE
RBC UA: NEGATIVE
SPEC GRAV UA: 1.02 (ref 1.010–1.025)
UROBILINOGEN UA: 1 U/dL
pH, UA: 6.5 (ref 5.0–8.0)

## 2018-04-30 MED ORDER — CEFDINIR 300 MG PO CAPS: 300.0000 mg | ORAL_CAPSULE | Freq: Two times a day (BID) | ORAL | 0 refills | Status: DC

## 2018-04-30 MED ORDER — AZELAIC ACID 15 % EX GEL: CUTANEOUS | 3 refills | Status: DC

## 2018-04-30 NOTE — Progress Notes (Signed)
Gabriel Wong is a 53 y.o. male [facility] today for pelvic pain.  Patient notes lower pelvic pain and pressure starting yesterday associated with some urinary frequency and urgency.  He has had histories of presumed prostatitis in the past.  He thinks his a symptoms are somewhat consistent with previous episodes but not entirely consistent.  He denies any vomiting or diarrhea fevers or chills.  He is not tried much medications yet for this.  He notes that about 10 days ago he was treated with a Z-Pak for bronchitis.   Additionally in the past he notes that he has had trials of Flomax which did not seem to change his urinary symptoms much.  He typically gets up 1-2 times a night to urinate.  Patient notes in the past he was treated with Cipro for prostatitis and notes that it caused significant abdominal discomfort.   Patient notes a history of liver inflammation in the past.  He is thought to have hepatitis elated to alcohol intake.  He has decreased his alcohol intake a bit.  Additionally he notes he needs a refill of the azelaic acid for rosacea. ROS as above:  Exam:  BP 127/80   Pulse 72   Ht 5\' 10"  (1.778 m)   Wt 192 lb (87.1 kg)   BMI 27.55 kg/m  Wt Readings from Last 5 Encounters:  04/30/18 192 lb (87.1 kg)  04/19/18 196 lb (88.9 kg)  12/09/17 194 lb (88 kg)  09/11/17 190 lb (86.2 kg)  08/01/17 194 lb (88 kg)    Gen: Well NAD HEENT: EOMI,  MMM Lungs: Normal work of breathing. CTABL Heart: RRR no MRG Abd: NABS, Soft. Nondistended, tender to palpation central pelvis and lower left quadrant abdomen with no rebound or guarding.  No masses palpated. Rectal exam: Normal-appearing anus.  Normal rectal tone.  Prostate is normal sized with no nodules and not particularly tender. Exts: Brisk capillary refill, warm and well perfused.  Skin:  Persistent rosacea face  Lab and Radiology Results Results for orders placed or performed in visit on 04/30/18 (from the past 72 hour(s))  POCT Urinalysis Dipstick     Status: None   Collection Time: 04/30/18  1:17 PM  Result Value Ref Range   Color, UA yellow    Clarity, UA clear    Glucose, UA Negative Negative   Bilirubin, UA small    Ketones, UA negative    Spec Grav, UA 1.020 1.010 - 1.025   Blood, UA negative    pH, UA 6.5 5.0 - 8.0   Protein, UA Negative Negative   Urobilinogen, UA 1.0 0.2 or 1.0 E.U./dL   Nitrite, UA negative    Leukocytes, UA Negative Negative   Appearance     Odor    CBC with Differential/Platelet     Status: Abnormal   Collection Time: 04/30/18  1:45 PM  Result Value Ref Range   WBC 6.7 3.8 - 10.8 Thousand/uL   RBC 4.59 4.20 - 5.80 Million/uL   Hemoglobin 15.4 13.2 - 17.1 g/dL   HCT 16.143.5 09.638.5 - 04.550.0 %   MCV 94.8 80.0 - 100.0 fL   MCH 33.6 (H) 27.0 - 33.0 pg   MCHC 35.4 32.0 - 36.0 g/dL   RDW 40.912.4 81.111.0 - 91.415.0 %   Platelets 256 140 - 400 Thousand/uL   MPV 11.5 7.5 - 12.5 fL   Neutro Abs 3,792 1,500 -  7,800 cells/uL   Lymphs Abs 2,171 850 - 3,900 cells/uL   Absolute Monocytes 529 200 - 950 cells/uL   Eosinophils Absolute 181 15 - 500 cells/uL   Basophils Absolute 27 0 - 200 cells/uL   Neutrophils Relative % 56.6 %   Total Lymphocyte 32.4 %   Monocytes Relative 7.9 %   Eosinophils Relative 2.7 %   Basophils Relative 0.4 %  COMPLETE METABOLIC PANEL WITH GFR     Status: Abnormal   Collection Time: 04/30/18  1:45 PM  Result Value Ref Range   Glucose, Bld 86 65 - 99 mg/dL    Comment: .            Fasting reference interval .    BUN 13 7 - 25 mg/dL   Creat 1.61 0.96 - 0.45 mg/dL    Comment: For patients >51 years of age, the reference limit for Creatinine is approximately 13% higher for people identified as African-American. .    GFR, Est Non African American 91 > OR = 60 mL/min/1.85m2   GFR, Est African American 105 > OR = 60  mL/min/1.42m2   BUN/Creatinine Ratio NOT APPLICABLE 6 - 22 (calc)   Sodium 141 135 - 146 mmol/L   Potassium 3.5 3.5 - 5.3 mmol/L   Chloride 101 98 - 110 mmol/L   CO2 29 20 - 32 mmol/L   Calcium 10.2 8.6 - 10.3 mg/dL   Total Protein 7.5 6.1 - 8.1 g/dL   Albumin 4.8 3.6 - 5.1 g/dL   Globulin 2.7 1.9 - 3.7 g/dL (calc)   AG Ratio 1.8 1.0 - 2.5 (calc)   Total Bilirubin 1.1 0.2 - 1.2 mg/dL   Alkaline phosphatase (APISO) 51 40 - 115 U/L   AST 67 (H) 10 - 35 U/L   ALT 129 (H) 9 - 46 U/L  PSA     Status: None   Collection Time: 04/30/18  1:45 PM  Result Value Ref Range   PSA 1.6 < OR = 4.0 ng/mL    Comment: The total PSA value from this assay system is  standardized against the WHO standard. The test  result will be approximately 20% lower when compared  to the equimolar-standardized total PSA (Beckman  Coulter). Comparison of serial PSA results should be  interpreted with this fact in mind. . This test was performed using the Siemens  chemiluminescent method. Values obtained from  different assay methods cannot be used interchangeably. PSA levels, regardless of value, should not be interpreted as absolute evidence of the presence or absence of disease.    No results found.    Assessment and Plan: 53 y.o. male with  Pelvis/abdominal pain.  Differential is somewhat broad at this point.  Patient does have some symptoms suggestive of prostatitis however his prostate is not very enlarged and not particularly tender.  Additionally PSA the following morning is not elevated which indicates prostatitis is less likely.  Diverticulitis is an alternative diagnosis.  Plan to treat with Omnicef and relative rest.  If not improving neck step would be likely CT scan of the abdomen and pelvis.  Will avoid Cipro due to GI upset previously.  Additionally patient has mild transaminitis.  This is been seen in the past and is attributable to his alcoholic hepatitis.  PDMP not reviewed this  encounter. Orders Placed This Encounter  Procedures  . Urine Culture  . CBC with Differential/Platelet  . COMPLETE METABOLIC PANEL WITH GFR  . PSA  . POCT Urinalysis Dipstick  Meds ordered this encounter  Medications  . cefdinir (OMNICEF) 300 MG capsule    Sig: Take 1 capsule (300 mg total) by mouth 2 (two) times daily.    Dispense:  28 capsule    Refill:  0  . Azelaic Acid (FINACEA) 15 % cream    Sig: After skin is thoroughly washed and patted dry, gently massage a thin film of azelaic acid cream into the affected area 1-2x daily.    Dispense:  50 g    Refill:  3     Historical information moved to improve visibility of documentation.  Past Medical History:  Diagnosis Date  . Alcoholic hepatitis   . Anxiety   . Asthma   . Gout 04/28/2015  . Hyperlipemia   . Hypertension   . Insomnia   . Rosacea   . Seasonal allergies    Past Surgical History:  Procedure Laterality Date  . BACK SURGERY    . FISSURECTOMY     Social History   Tobacco Use  . Smoking status: Current Every Day Smoker    Packs/day: 0.25    Years: 25.00    Pack years: 6.25    Types: Cigarettes, Cigars  . Smokeless tobacco: Never Used  Substance Use Topics  . Alcohol use: Yes   family history is not on file.  Medications: Current Outpatient Medications  Medication Sig Dispense Refill  . albuterol (VENTOLIN HFA) 108 (90 Base) MCG/ACT inhaler INHALE 2 PUFFS INTO THE LUNGS EVERY 4 TO 6 HOURS ONLY AS NEEDED FOR SHORTNESS OF BREATH OR WHEEZING. 18 Inhaler 3  . allopurinol (ZYLOPRIM) 100 MG tablet Take 2 tablets (200 mg total) by mouth daily. 180 tablet 3  . ALPRAZolam (XANAX) 0.25 MG tablet Take 1 tablet (0.25 mg total) by mouth 2 (two) times daily as needed. 180 tablet 3  . amLODipine (NORVASC) 5 MG tablet Take 1 tablet (5 mg total) by mouth daily. 90 tablet 3  . Azelaic Acid (FINACEA) 15 % cream After skin is thoroughly washed and patted dry, gently massage a thin film of azelaic acid cream into the  affected area 1-2x daily. 50 g 3  . azithromycin (ZITHROMAX) 250 MG tablet Take 1 tablet (250 mg total) by mouth daily. Take first 2 tablets together, then 1 every day until finished. 6 tablet 0  . colchicine 0.6 MG tablet Take 1 tablet (0.6 mg total) by mouth daily as needed. 90 tablet 3  . eplerenone (INSPRA) 25 MG tablet TAKE 1 TABLET (25 MG TOTAL) BY MOUTH DAILY. 90 tablet 2  . ezetimibe (ZETIA) 10 MG tablet TAKE 1 TABLET (10 MG TOTAL) BY MOUTH DAILY. 90 tablet 3  . fenofibrate 160 MG tablet Take 1 tablet (160 mg total) by mouth daily. 90 tablet 3  . fluticasone (FLONASE) 50 MCG/ACT nasal spray Place 1 spray into both nostrils daily. 48 g 3  . Fluticasone-Salmeterol (ADVAIR DISKUS) 100-50 MCG/DOSE AEPB Inhale 1 puff into the lungs 2 (two) times daily. NEEDS APPOINTMENT FOR FUTURE REFILLS. 180 each 3  . Icosapent Ethyl (VASCEPA) 1 g CAPS Take 2 capsules (2 g total) by mouth 2 (two) times daily. 360 capsule 3  . lansoprazole (PREVACID) 30 MG capsule TAKE ONE CAPSULE BY MOUTH TWICE A DAY BEFORE A MEAL 180 capsule 3  . montelukast (SINGULAIR) 10 MG tablet Take 1 tablet (10 mg total) by mouth at bedtime. 90 tablet 3  . valsartan (DIOVAN) 320 MG tablet Take 1 tablet (320 mg total) by mouth daily. 30 tablet  3  . cefdinir (OMNICEF) 300 MG capsule Take 1 capsule (300 mg total) by mouth 2 (two) times daily. 28 capsule 0   No current facility-administered medications for this visit.    Allergies  Allergen Reactions  . Lipitor [Atorvastatin] Other (See Comments)    Elevated LFTs  . Tylenol [Acetaminophen]      Discussed warning signs or symptoms. Please see discharge instructions. Patient expresses understanding.

## 2018-04-30 NOTE — Patient Instructions (Signed)
Thank you for coming in today. Get labs today.  Take omnicef twice daily for possible prostatitis.   If your belly pain worsens, or you have high fever, bad vomiting, blood in your stool or black tarry stool go to the Emergency Room.   Prostatitis  Prostatitis is swelling or inflammation of the prostate gland. The prostate is a walnut-sized gland that is involved in the production of semen. It is located below a man's bladder, in front of the rectum. There are four types of prostatitis:  Chronic nonbacterial prostatitis. This is the most common type of prostatitis. It may be associated with a viral infection or autoimmune disorder.  Acute bacterial prostatitis. This is the least common type of prostatitis. It starts quickly and is usually associated with a bladder infection, high fever, and shaking chills. It can occur at any age.  Chronic bacterial prostatitis. This type usually results from acute bacterial prostatitis that happens repeatedly (is recurrent) or has not been treated properly. It can occur in men of any age but is most common among middle-aged men whose prostate has begun to get larger. The symptoms are not as severe as symptoms caused by acute bacterial prostatitis.  Prostatodynia or chronic pelvic pain syndrome (CPPS). This type is also called pelvic floor disorder. It is associated with increased muscular tone in the pelvis surrounding the prostate. What are the causes? Bacterial prostatitis is caused by infection from bacteria. Chronic nonbacterial prostatitis may be caused by:  Urinary tract infections (UTIs).  Nerve damage.  A response by the body's disease-fighting system (autoimmune response).  Chemicals in the urine. The causes of the other types of prostatitis are usually not known. What are the signs or symptoms? Symptoms of this condition vary depending upon the type of prostatitis. If you have acute bacterial prostatitis, you may experience:  Urinary  symptoms, such as: ? Painful urination. ? Burning during urination. ? Frequent and sudden urges to urinate. ? Inability to start urinating. ? A weak or interrupted stream of urine.  Vomiting.  Nausea.  Fever.  Chills.  Inability to empty the bladder completely.  Pain in the: ? Muscles or joints. ? Lower back. ? Lower abdomen. If you have any of the other types of prostatitis, you may experience:  Urinary symptoms, such as: ? Sudden urges to urinate. ? Frequent urination. ? Difficulty starting urination. ? Weak urine stream. ? Dribbling after urination.  Discharge from the urethra. The urethra is a tube that opens at the end of the penis.  Pain in the: ? Testicles. ? Penis or tip of the penis. ? Rectum. ? Area in front of the rectum and below the scrotum (perineum).  Problems with sexual function.  Painful ejaculation.  Bloody semen. How is this diagnosed? This condition may be diagnosed based on:  A physical and medical exam.  Your symptoms.  A urine test to check for bacteria.  An exam in which a health care provider uses a finger to feel the prostate (digital rectal exam).  A test of a sample of semen.  Blood tests.  Ultrasound.  Removal of prostate tissue to be examined under a microscope (biopsy).  Tests to check how your body handles urine (urodynamic tests).  A test to look inside your bladder or urethra (cystoscopy). How is this treated? Treatment for this condition depends on the type of prostatitis. Treatment may involve:  Medicines to relieve pain or inflammation.  Medicines to help relax your muscles.  Physical therapy.  Heat therapy.  Techniques to help you control certain body functions (biofeedback).  Relaxation exercises.  Antibiotic medicine, if your condition is caused by bacteria.  Warm water baths (sitz baths). Sitz baths help with relaxing your pelvic floor muscles, which helps to relieve pressure on the  prostate. Follow these instructions at home:   Take over-the-counter and prescription medicines only as told by your health care provider.  If you were prescribed an antibiotic, take it as told by your health care provider. Do not stop taking the antibiotic even if you start to feel better.  If physical therapy, biofeedback, or relaxation exercises were prescribed, do exercises as instructed.  Take sitz baths as directed by your health care provider. For a sitz bath, sit in warm water that is deep enough to cover your hips and buttocks.  Keep all follow-up visits as told by your health care provider. This is important. Contact a health care provider if:  Your symptoms get worse.  You have a fever. Get help right away if:  You have chills.  You feel nauseous.  You vomit.  You feel light-headed or feel like you are going to faint.  You are unable to urinate.  You have blood or blood clots in your urine. This information is not intended to replace advice given to you by your health care provider. Make sure you discuss any questions you have with your health care provider. Document Released: 03/30/2000 Document Revised: 06/15/2017 Document Reviewed: 12/22/2015 Elsevier Interactive Patient Education  2019 Elsevier Inc.    Diverticulitis  Diverticulitis is infection or inflammation of small pouches (diverticula) in the colon that form due to a condition called diverticulosis. Diverticula can trap stool (feces) and bacteria, causing infection and inflammation. Diverticulitis may cause severe stomach pain and diarrhea. It may lead to tissue damage in the colon that causes bleeding. The diverticula may also burst (rupture) and cause infected stool to enter other areas of the abdomen. Complications of diverticulitis can include:  Bleeding.  Severe infection.  Severe pain.  Rupture (perforation) of the colon.  Blockage (obstruction) of the colon. What are the causes? This  condition is caused by stool becoming trapped in the diverticula, which allows bacteria to grow in the diverticula. This leads to inflammation and infection. What increases the risk? You are more likely to develop this condition if:  You have diverticulosis. The risk for diverticulosis increases if: ? You are overweight or obese. ? You use tobacco products. ? You do not get enough exercise.  You eat a diet that does not include enough fiber. High-fiber foods include fruits, vegetables, beans, nuts, and whole grains. What are the signs or symptoms? Symptoms of this condition may include:  Pain and tenderness in the abdomen. The pain is normally located on the left side of the abdomen, but it may occur in other areas.  Fever and chills.  Bloating.  Cramping.  Nausea.  Vomiting.  Changes in bowel routines.  Blood in your stool. How is this diagnosed? This condition is diagnosed based on:  Your medical history.  A physical exam.  Tests to make sure there is nothing else causing your condition. These tests may include: ? Blood tests. ? Urine tests. ? Imaging tests of the abdomen, including X-rays, ultrasounds, MRIs, or CT scans. How is this treated? Most cases of this condition are mild and can be treated at home. Treatment may include:  Taking over-the-counter pain medicines.  Following a clear liquid diet.  Taking antibiotic medicines by mouth.  Rest. More severe cases may need to be treated at a hospital. Treatment may include:  Not eating or drinking.  Taking prescription pain medicine.  Receiving antibiotic medicines through an IV tube.  Receiving fluids and nutrition through an IV tube.  Surgery. When your condition is under control, your health care provider may recommend that you have a colonoscopy. This is an exam to look at the entire large intestine. During the exam, a lubricated, bendable tube is inserted into the anus and then passed into the  rectum, colon, and other parts of the large intestine. A colonoscopy can show how severe your diverticula are and whether something else may be causing your symptoms. Follow these instructions at home: Medicines  Take over-the-counter and prescription medicines only as told by your health care provider. These include fiber supplements, probiotics, and stool softeners.  If you were prescribed an antibiotic medicine, take it as told by your health care provider. Do not stop taking the antibiotic even if you start to feel better.  Do not drive or use heavy machinery while taking prescription pain medicine. General instructions   Follow a full liquid diet or another diet as directed by your health care provider. After your symptoms improve, your health care provider may tell you to change your diet. He or she may recommend that you eat a diet that contains at least 25 g (25 grams) of fiber daily. Fiber makes it easier to pass stool. Healthy sources of fiber include: ? Berries. One cup contains 4-8 grams of fiber. ? Beans or lentils. One half cup contains 5-8 grams of fiber. ? Green vegetables. One cup contains 4 grams of fiber.  Exercise for at least 30 minutes, 3 times each week. You should exercise hard enough to raise your heart rate and break a sweat.  Keep all follow-up visits as told by your health care provider. This is important. You may need a colonoscopy. Contact a health care provider if:  Your pain does not improve.  You have a hard time drinking or eating food.  Your bowel movements do not return to normal. Get help right away if:  Your pain gets worse.  Your symptoms do not get better with treatment.  Your symptoms suddenly get worse.  You have a fever.  You vomit more than one time.  You have stools that are bloody, black, or tarry. Summary  Diverticulitis is infection or inflammation of small pouches (diverticula) in the colon that form due to a condition called  diverticulosis. Diverticula can trap stool (feces) and bacteria, causing infection and inflammation.  You are at higher risk for this condition if you have diverticulosis and you eat a diet that does not include enough fiber.  Most cases of this condition are mild and can be treated at home. More severe cases may need to be treated at a hospital.  When your condition is under control, your health care provider may recommend that you have an exam called a colonoscopy. This exam can show how severe your diverticula are and whether something else may be causing your symptoms. This information is not intended to replace advice given to you by your health care provider. Make sure you discuss any questions you have with your health care provider. Document Released: 01/10/2005 Document Revised: 05/05/2016 Document Reviewed: 05/05/2016 Elsevier Interactive Patient Education  2019 [employer]

## 2018-05-01 LAB — CBC WITH DIFFERENTIAL/PLATELET
ABSOLUTE MONOCYTES: 529 {cells}/uL (ref 200–950)
BASOS PCT: 0.4 %
Basophils Absolute: 27 cells/uL (ref 0–200)
EOS ABS: 181 {cells}/uL (ref 15–500)
Eosinophils Relative: 2.7 %
HCT: 43.5 % (ref 38.5–50.0)
HEMOGLOBIN: 15.4 g/dL (ref 13.2–17.1)
Lymphs Abs: 2171 cells/uL (ref 850–3900)
MCH: 33.6 pg — AB (ref 27.0–33.0)
MCHC: 35.4 g/dL (ref 32.0–36.0)
MCV: 94.8 fL (ref 80.0–100.0)
MONOS PCT: 7.9 %
MPV: 11.5 fL (ref 7.5–12.5)
Neutro Abs: 3792 cells/uL (ref 1500–7800)
Neutrophils Relative %: 56.6 %
Platelets: 256 10*3/uL (ref 140–400)
RBC: 4.59 10*6/uL (ref 4.20–5.80)
RDW: 12.4 % (ref 11.0–15.0)
Total Lymphocyte: 32.4 %
WBC: 6.7 10*3/uL (ref 3.8–10.8)

## 2018-05-01 LAB — URINE CULTURE
MICRO NUMBER:: 59577
RESULT: NO GROWTH
SPECIMEN QUALITY:: ADEQUATE

## 2018-05-01 LAB — COMPLETE METABOLIC PANEL WITH GFR
AG Ratio: 1.8 (calc) (ref 1.0–2.5)
ALBUMIN MSPROF: 4.8 g/dL (ref 3.6–5.1)
ALT: 129 U/L — ABNORMAL HIGH (ref 9–46)
AST: 67 U/L — ABNORMAL HIGH (ref 10–35)
Alkaline phosphatase (APISO): 51 U/L (ref 40–115)
BUN: 13 mg/dL (ref 7–25)
CALCIUM: 10.2 mg/dL (ref 8.6–10.3)
CO2: 29 mmol/L (ref 20–32)
CREATININE: 0.96 mg/dL (ref 0.70–1.33)
Chloride: 101 mmol/L (ref 98–110)
GFR, EST AFRICAN AMERICAN: 105 mL/min/{1.73_m2} (ref >=60)
GFR, EST NON AFRICAN AMERICAN: 91 mL/min/{1.73_m2} (ref >=60)
GLUCOSE: 86 mg/dL (ref 65–99)
Globulin: 2.7 g/dL (calc) (ref 1.9–3.7)
Potassium: 3.5 mmol/L (ref 3.5–5.3)
Sodium: 141 mmol/L (ref 135–146)
TOTAL PROTEIN: 7.5 g/dL (ref 6.1–8.1)
Total Bilirubin: 1.1 mg/dL (ref 0.2–1.2)

## 2018-05-01 LAB — PSA: PSA: 1.6 ng/mL (ref <=4.0)

## 2018-05-15 DIAGNOSIS — M5441 Lumbago with sciatica, right side: Secondary | ICD-10-CM | POA: Diagnosis not present

## 2018-05-15 DIAGNOSIS — M542 Cervicalgia: Secondary | ICD-10-CM | POA: Diagnosis not present

## 2018-05-15 DIAGNOSIS — M546 Pain in thoracic spine: Secondary | ICD-10-CM | POA: Diagnosis not present

## 2018-05-18 ENCOUNTER — Other Ambulatory Visit: Payer: Self-pay | Admitting: Sports Medicine

## 2018-05-20 ENCOUNTER — Other Ambulatory Visit: Payer: Self-pay | Admitting: Sports Medicine

## 2018-05-20 DIAGNOSIS — E781 Pure hyperglyceridemia: Secondary | ICD-10-CM

## 2018-05-26 ENCOUNTER — Other Ambulatory Visit: Payer: Self-pay | Admitting: Sports Medicine

## 2018-05-26 DIAGNOSIS — E781 Pure hyperglyceridemia: Secondary | ICD-10-CM

## 2018-05-27 ENCOUNTER — Other Ambulatory Visit: Payer: Self-pay

## 2018-05-27 MED ORDER — ALPRAZOLAM 0.25 MG PO TABS: 0.2500 mg | ORAL_TABLET | Freq: Two times a day (BID) | ORAL | 0 refills | Status: DC | PRN

## 2018-05-27 NOTE — Telephone Encounter (Signed)
[facility] requests a refill on Xanax.

## 2018-06-17 ENCOUNTER — Other Ambulatory Visit: Payer: Self-pay | Admitting: Sports Medicine

## 2018-07-23 ENCOUNTER — Telehealth: Payer: Self-pay | Admitting: Sports Medicine

## 2018-07-23 DIAGNOSIS — Z113 Encounter for screening for infections with a predominantly sexual mode of transmission: Secondary | ICD-10-CM

## 2018-07-23 DIAGNOSIS — E781 Pure hyperglyceridemia: Secondary | ICD-10-CM

## 2018-07-23 DIAGNOSIS — M1A9XX0 Chronic gout, unspecified, without tophus (tophi): Secondary | ICD-10-CM

## 2018-07-23 DIAGNOSIS — N139 Obstructive and reflux uropathy, unspecified: Secondary | ICD-10-CM

## 2018-07-23 NOTE — Telephone Encounter (Signed)
PT's physical was pushed back to 09/18/18. Wants bloodwork sent in to be completed before appointment.   Regular Bloodwork STD Check

## 2018-07-24 DIAGNOSIS — Z113 Encounter for screening for infections with a predominantly sexual mode of transmission: Secondary | ICD-10-CM | POA: Insufficient documentation

## 2018-07-24 NOTE — Telephone Encounter (Signed)
Done

## 2018-07-24 NOTE — Telephone Encounter (Signed)
Lab order faxed downstairs and pt notified.

## 2018-07-24 NOTE — Addendum Note (Signed)
Addended by: Monica Becton on: 07/24/2018 11:01 AM   Modules accepted: Orders

## 2018-07-31 ENCOUNTER — Other Ambulatory Visit: Payer: Self-pay | Admitting: Sports Medicine

## 2018-08-05 ENCOUNTER — Encounter: Payer: BLUE CROSS/BLUE SHIELD | Admitting: Sports Medicine

## 2018-08-11 ENCOUNTER — Telehealth: Payer: Self-pay | Admitting: *Deleted

## 2018-08-11 DIAGNOSIS — Z1211 Encounter for screening for malignant neoplasm of colon: Secondary | ICD-10-CM

## 2018-08-11 NOTE — Telephone Encounter (Signed)
Orders placed.

## 2018-08-11 NOTE — Telephone Encounter (Signed)
Pt left vm today wanting Cologuard ordered.  He checked with his ins and it is covered.

## 2018-08-12 ENCOUNTER — Other Ambulatory Visit: Payer: Self-pay | Admitting: *Deleted

## 2018-08-12 ENCOUNTER — Other Ambulatory Visit: Payer: Self-pay | Admitting: Sports Medicine

## 2018-08-12 MED ORDER — FLUTICASONE-SALMETEROL 100-50 MCG/DOSE IN AEPB: 1.0000 | INHALATION_SPRAY | Freq: Two times a day (BID) | RESPIRATORY_TRACT | 1 refills | Status: DC

## 2018-08-16 ENCOUNTER — Other Ambulatory Visit: Payer: Self-pay | Admitting: Sports Medicine

## 2018-08-16 DIAGNOSIS — E781 Pure hyperglyceridemia: Secondary | ICD-10-CM

## 2018-08-16 NOTE — Telephone Encounter (Signed)
Needs appt for refills

## 2018-08-18 ENCOUNTER — Ambulatory Visit (INDEPENDENT_AMBULATORY_CARE_PROVIDER_SITE_OTHER): Payer: BLUE CROSS/BLUE SHIELD | Admitting: Sports Medicine

## 2018-08-18 DIAGNOSIS — J4530 Mild persistent asthma, uncomplicated: Secondary | ICD-10-CM

## 2018-08-18 DIAGNOSIS — Z0000 Encounter for general adult medical examination without abnormal findings: Secondary | ICD-10-CM

## 2018-08-18 DIAGNOSIS — M1A9XX0 Chronic gout, unspecified, without tophus (tophi): Secondary | ICD-10-CM | POA: Diagnosis not present

## 2018-08-18 DIAGNOSIS — L719 Rosacea, unspecified: Secondary | ICD-10-CM

## 2018-08-18 DIAGNOSIS — N139 Obstructive and reflux uropathy, unspecified: Secondary | ICD-10-CM

## 2018-08-18 DIAGNOSIS — H31001 Unspecified chorioretinal scars, right eye: Secondary | ICD-10-CM

## 2018-08-18 DIAGNOSIS — E781 Pure hyperglyceridemia: Secondary | ICD-10-CM | POA: Diagnosis not present

## 2018-08-18 DIAGNOSIS — I10 Essential (primary) hypertension: Secondary | ICD-10-CM

## 2018-08-18 DIAGNOSIS — M170 Bilateral primary osteoarthritis of knee: Secondary | ICD-10-CM

## 2018-08-18 MED ORDER — FLUTICASONE PROPIONATE 50 MCG/ACT NA SUSP: 1.0000 | Freq: Every day | NASAL | 3 refills | Status: DC

## 2018-08-18 MED ORDER — AZELAIC ACID 15 % EX GEL: CUTANEOUS | 3 refills | Status: DC

## 2018-08-18 MED ORDER — FENOFIBRATE 160 MG PO TABS: 160.0000 mg | ORAL_TABLET | Freq: Every day | ORAL | 1 refills | Status: DC

## 2018-08-18 MED ORDER — ICOSAPENT ETHYL 1 G PO CAPS: 2.0000 | ORAL_CAPSULE | Freq: Two times a day (BID) | ORAL | 1 refills | Status: DC

## 2018-08-18 MED ORDER — VALSARTAN 320 MG PO TABS: 320.0000 mg | ORAL_TABLET | Freq: Every day | ORAL | 1 refills | Status: DC

## 2018-08-18 MED ORDER — AMLODIPINE BESYLATE 5 MG PO TABS: 5.0000 mg | ORAL_TABLET | Freq: Every day | ORAL | 1 refills | Status: DC

## 2018-08-18 MED ORDER — ALBUTEROL SULFATE HFA 108 (90 BASE) MCG/ACT IN AERS: INHALATION_SPRAY | RESPIRATORY_TRACT | 3 refills | Status: DC

## 2018-08-18 MED ORDER — ALPRAZOLAM 0.25 MG PO TABS: 0.2500 mg | ORAL_TABLET | Freq: Two times a day (BID) | ORAL | 0 refills | Status: DC | PRN

## 2018-08-18 MED ORDER — EZETIMIBE 10 MG PO TABS: ORAL_TABLET | ORAL | 1 refills | Status: DC

## 2018-08-18 MED ORDER — LANSOPRAZOLE 30 MG PO CPDR: DELAYED_RELEASE_CAPSULE | ORAL | 1 refills | Status: DC

## 2018-08-18 MED ORDER — ALLOPURINOL 300 MG PO TABS: 300.0000 mg | ORAL_TABLET | Freq: Every day | ORAL | 1 refills | Status: DC

## 2018-08-18 MED ORDER — MONTELUKAST SODIUM 10 MG PO TABS: 10.0000 mg | ORAL_TABLET | Freq: Every day | ORAL | 1 refills | Status: DC

## 2018-08-18 MED ORDER — COLCHICINE 0.6 MG PO TABS: 0.6000 mg | ORAL_TABLET | Freq: Every day | ORAL | 1 refills | Status: AC | PRN

## 2018-08-18 NOTE — Assessment & Plan Note (Signed)
Likely knee osteoarthritis. We will address this at his follow-up visit.

## 2018-08-18 NOTE — Progress Notes (Signed)
Virtual Visit via Telephone   I connected with  Gabriel Wong  on 08/18/18 by telephone/telehealth and verified that I am speaking with the correct person using two identifiers.   I discussed the limitations, risks, security and privacy concerns of performing an evaluation and management service by telephone, including the higher likelihood of inaccurate diagnosis and treatment, and the availability of in person appointments.  We also discussed the likely need of an additional face to face encounter for complete and high quality delivery of care.  I also discussed with the patient that there may be a patient responsible charge related to this service. The patient expressed understanding and wishes to proceed.  Provider location is either at home or medical facility. Patient location is at their home, different from provider location. People involved in care of the patient during this telehealth encounter were myself, my nurse/medical assistant, and my front office/scheduling team member.  Subjective:    CC: Follow-up several issues  HPI: Hypertension, hyperlipidemia, gout, anxiety, all relatively well controlled, he plans to to the office in June for an appointment and get his labs checked at that time.  Needs refills on everything.  In addition he is noted some knee pain, gelling, it occasionally swells, it never gets hot, red, and typically worsens over several days rather than overnight.  Symptoms are moderate, persistent.  I reviewed the past medical history, family history, social history, surgical history, and allergies today and no changes were needed.  Please see the problem list section below in epic for further details.  Past Medical History: Past Medical History:  Diagnosis Date  . Alcoholic hepatitis   . Anxiety   . Asthma   . Gout 04/28/2015  . Hyperlipemia   . Hypertension   . Insomnia   . Rosacea   . Seasonal allergies    Past Surgical History: Past Surgical  History:  Procedure Laterality Date  . BACK SURGERY    . FISSURECTOMY     Social History: Social History   Socioeconomic History  . Marital status: Single    Spouse name: Not on file  . Number of children: Not on file  . Years of education: Not on file  . Highest education level: Not on file  Occupational History  . Not on file  Social Needs  . Financial resource strain: Not on file  . Food insecurity:    Worry: Not on file    Inability: Not on file  . Transportation needs:    Medical: Not on file    Non-medical: Not on file  Tobacco Use  . Smoking status: Current Every Day Smoker    Packs/day: 0.25    Years: 25.00    Pack years: 6.25    Types: Cigarettes, Cigars  . Smokeless tobacco: Never Used  Substance and Sexual Activity  . Alcohol use: Yes  . Drug use: No  . Sexual activity: Not on file  Lifestyle  . Physical activity:    Days per week: Not on file    Minutes per session: Not on file  . Stress: Not on file  Relationships  . Social connections:    Talks on phone: Not on file    Gets together: Not on file    Attends religious service: Not on file    Active member of club or organization: Not on file    Attends meetings of clubs or organizations: Not on file    Relationship status: Not on file  Other Topics  Concern  . Not on file  Social History Narrative  . Not on file   Family History: No family history on file. Allergies: Allergies  Allergen Reactions  . Lipitor [Atorvastatin] Other (See Comments)    Elevated LFTs  . Tylenol [Acetaminophen]    Medications: See med rec.  Review of Systems: No fevers, chills, night sweats, weight loss, chest pain, or shortness of breath.   Objective:    General: Speaking full sentences, no audible heavy breathing.  Sounds alert and appropriately interactive.  No other physical exam performed due to the non-face to face nature of this visit.  Impression and Recommendations:    Asthma, mild persistent Stable  and controlled.  No changes.  Essential hypertension Stable, we are seeing him in June to recheck blood pressure, refilling medications.  Gout Stable, rechecking uric acid levels.   Continue allopurinol.   Colchicine.  Hypertriglyceridemia Refilling medications, rechecking lipids.  Obstructive uropathy Symptoms resolved, off of Flomax.  Retinal scar Off of eplerenone, he will follow this up with his ophthalmologist.  Primary osteoarthritis of both knees Likely knee osteoarthritis. We will address this at his follow-up visit.  Annual physical exam Up-to-date on screening measures, awaiting Cologuard testing.   I discussed the above assessment and treatment plan with the patient. The patient was provided an opportunity to ask questions and all were answered. The patient agreed with the plan and demonstrated an understanding of the instructions.   The patient was advised to call back or seek an in-person evaluation if the symptoms worsen or if the condition fails to improve as anticipated.   I provided 25 minutes of non-face-to-face time during this encounter, 15 minutes of additional time was needed to gather information, review chart, records, communicate/coordinate with staff remotely, and complete documentation.   ___________________________________________ Ihor Austinhomas J. Benjamin Stainhekkekandam, M.D., ABFM., CAQSM. Primary Care and Sports Medicine [facility]  Adjunct Professor of Family Medicine  [employer]

## 2018-08-18 NOTE — Assessment & Plan Note (Signed)
Off of eplerenone, he will follow this up with his ophthalmologist.

## 2018-08-18 NOTE — Assessment & Plan Note (Signed)
Stable, we are seeing him in June to recheck blood pressure, refilling medications.

## 2018-08-18 NOTE — Assessment & Plan Note (Signed)
Up-to-date on screening measures, awaiting Cologuard testing.

## 2018-08-18 NOTE — Assessment & Plan Note (Signed)
Stable and controlled.  No changes.

## 2018-08-18 NOTE — Assessment & Plan Note (Signed)
Symptoms resolved, off of Flomax.

## 2018-08-18 NOTE — Telephone Encounter (Signed)
Patient scheduled.

## 2018-08-18 NOTE — Assessment & Plan Note (Signed)
Refilling medications, rechecking lipids.

## 2018-08-18 NOTE — Assessment & Plan Note (Signed)
Stable, rechecking uric acid levels.   Continue allopurinol.   Colchicine.

## 2018-08-22 DIAGNOSIS — E781 Pure hyperglyceridemia: Secondary | ICD-10-CM | POA: Diagnosis not present

## 2018-08-22 DIAGNOSIS — N139 Obstructive and reflux uropathy, unspecified: Secondary | ICD-10-CM | POA: Diagnosis not present

## 2018-08-22 DIAGNOSIS — R7989 Other specified abnormal findings of blood chemistry: Secondary | ICD-10-CM | POA: Diagnosis not present

## 2018-08-22 DIAGNOSIS — M1A9XX0 Chronic gout, unspecified, without tophus (tophi): Secondary | ICD-10-CM | POA: Diagnosis not present

## 2018-08-27 LAB — CBC
HCT: 44.6 % (ref 38.5–50.0)
Hemoglobin: 16.1 g/dL (ref 13.2–17.1)
MCH: 34.7 pg — ABNORMAL HIGH (ref 27.0–33.0)
MCHC: 36.1 g/dL — ABNORMAL HIGH (ref 32.0–36.0)
MCV: 96.1 fL (ref 80.0–100.0)
MPV: 11.6 fL (ref 7.5–12.5)
Platelets: 239 10*3/uL (ref 140–400)
RBC: 4.64 10*6/uL (ref 4.20–5.80)
RDW: 12.9 % (ref 11.0–15.0)
WBC: 5.2 10*3/uL (ref 3.8–10.8)

## 2018-08-27 LAB — COMPREHENSIVE METABOLIC PANEL
AG Ratio: 2 (calc) (ref 1.0–2.5)
ALT: 124 U/L — ABNORMAL HIGH (ref 9–46)
AST: 101 U/L — ABNORMAL HIGH (ref 10–35)
Albumin: 5 g/dL (ref 3.6–5.1)
Alkaline phosphatase (APISO): 44 U/L (ref 35–144)
BUN: 18 mg/dL (ref 7–25)
CO2: 28 mmol/L (ref 20–32)
Calcium: 10.4 mg/dL — ABNORMAL HIGH (ref 8.6–10.3)
Chloride: 103 mmol/L (ref 98–110)
Creat: 0.92 mg/dL (ref 0.70–1.33)
Globulin: 2.5 g/dL (calc) (ref 1.9–3.7)
Glucose, Bld: 91 mg/dL (ref 65–99)
Potassium: 3.4 mmol/L — ABNORMAL LOW (ref 3.5–5.3)
Sodium: 141 mmol/L (ref 135–146)
Total Bilirubin: 1.2 mg/dL (ref 0.2–1.2)
Total Protein: 7.5 g/dL (ref 6.1–8.1)

## 2018-08-27 LAB — C. TRACHOMATIS/N. GONORRHOEAE RNA
C. trachomatis RNA, TMA: NOT DETECTED
N. gonorrhoeae RNA, TMA: NOT DETECTED

## 2018-08-27 LAB — PSA, TOTAL AND FREE
PSA, % Free: 17 % (calc) — ABNORMAL LOW (ref >25)
PSA, Free: 0.1 ng/mL
PSA, Total: 0.6 ng/mL (ref <=4.0)

## 2018-08-27 LAB — HEPATITIS PANEL, ACUTE
Hep A IgM: NONREACTIVE
Hep B C IgM: NONREACTIVE
Hepatitis B Surface Ag: NONREACTIVE
Hepatitis C Ab: NONREACTIVE
SIGNAL TO CUT-OFF: 0.02 (ref <1.00)

## 2018-08-27 LAB — LIPID PANEL W/REFLEX DIRECT LDL
Cholesterol: 198 mg/dL (ref <200)
HDL: 38 mg/dL — ABNORMAL LOW (ref >=40)
LDL Cholesterol (Calc): 132 mg/dL (calc) — ABNORMAL HIGH
Non-HDL Cholesterol (Calc): 160 mg/dL (calc) — ABNORMAL HIGH (ref <130)
Total CHOL/HDL Ratio: 5.2 (calc) — ABNORMAL HIGH (ref <5.0)
Triglycerides: 149 mg/dL (ref <150)

## 2018-08-27 LAB — RPR: RPR Ser Ql: NONREACTIVE

## 2018-08-27 LAB — HSV 1/2 AB (IGM), IFA W/RFLX TITER
HSV 1 IgM Screen: NEGATIVE
HSV 2 IgM Screen: NEGATIVE

## 2018-08-27 LAB — HEMOGLOBIN A1C
Hgb A1c MFr Bld: 4.9 % of total Hgb (ref <5.7)
Mean Plasma Glucose: 94 (calc)
eAG (mmol/L): 5.2 (calc)

## 2018-08-27 LAB — HIV ANTIBODY (ROUTINE TESTING W REFLEX): HIV 1&2 Ab, 4th Generation: NONREACTIVE

## 2018-08-27 LAB — TSH: TSH: 3.15 mIU/L (ref 0.40–4.50)

## 2018-08-27 LAB — URIC ACID: Uric Acid, Serum: 4.7 mg/dL (ref 4.0–8.0)

## 2018-09-03 ENCOUNTER — Other Ambulatory Visit: Payer: Self-pay | Admitting: Sports Medicine

## 2018-09-18 ENCOUNTER — Ambulatory Visit (INDEPENDENT_AMBULATORY_CARE_PROVIDER_SITE_OTHER): Payer: BLUE CROSS/BLUE SHIELD

## 2018-09-18 ENCOUNTER — Encounter: Payer: Self-pay | Admitting: Sports Medicine

## 2018-09-18 ENCOUNTER — Other Ambulatory Visit: Payer: Self-pay

## 2018-09-18 ENCOUNTER — Ambulatory Visit (INDEPENDENT_AMBULATORY_CARE_PROVIDER_SITE_OTHER): Payer: BLUE CROSS/BLUE SHIELD | Admitting: Sports Medicine

## 2018-09-18 DIAGNOSIS — M170 Bilateral primary osteoarthritis of knee: Secondary | ICD-10-CM | POA: Diagnosis not present

## 2018-09-18 DIAGNOSIS — K7010 Alcoholic hepatitis without ascites: Secondary | ICD-10-CM

## 2018-09-18 DIAGNOSIS — M25561 Pain in right knee: Secondary | ICD-10-CM | POA: Diagnosis not present

## 2018-09-18 DIAGNOSIS — Z0000 Encounter for general adult medical examination without abnormal findings: Secondary | ICD-10-CM

## 2018-09-18 DIAGNOSIS — M25562 Pain in left knee: Secondary | ICD-10-CM | POA: Diagnosis not present

## 2018-09-18 NOTE — Assessment & Plan Note (Signed)
LFTs around 100 and stable. Cut back drinking about 50%. I do think we need annual liver ultrasound, we can do one today.

## 2018-09-18 NOTE — Progress Notes (Signed)
Subjective:    CC: Annual physical  HPI:  Gabriel Wong is here for his physical.  We are still awaiting Cologuard.  Left knee pain: Anterior, worse going up and down stairs, on and off.  No swelling, no trauma.  Localized without radiation, mechanical symptoms.  Transaminitis: Likely secondary to alcohol use, we are doing yearly hepatic ultrasounds.  Hypertension: Well-controlled.  Gout: Well-controlled.  I reviewed the past medical history, family history, social history, surgical history, and allergies today and no changes were needed.  Please see the problem list section below in epic for further details.  Past Medical History: Past Medical History:  Diagnosis Date  . Alcoholic hepatitis   . Anxiety   . Asthma   . Gout 04/28/2015  . Hyperlipemia   . Hypertension   . Insomnia   . Rosacea   . Seasonal allergies    Past Surgical History: Past Surgical History:  Procedure Laterality Date  . BACK SURGERY    . FISSURECTOMY     Social History: Social History   Socioeconomic History  . Marital status: Single    Spouse name: Not on file  . Number of children: Not on file  . Years of education: Not on file  . Highest education level: Not on file  Occupational History  . Not on file  Social Needs  . Financial resource strain: Not on file  . Food insecurity:    Worry: Not on file    Inability: Not on file  . Transportation needs:    Medical: Not on file    Non-medical: Not on file  Tobacco Use  . Smoking status: Current Every Day Smoker    Packs/day: 0.25    Years: 25.00    Pack years: 6.25    Types: Cigarettes, Cigars  . Smokeless tobacco: Never Used  Substance and Sexual Activity  . Alcohol use: Yes  . Drug use: No  . Sexual activity: Not on file  Lifestyle  . Physical activity:    Days per week: Not on file    Minutes per session: Not on file  . Stress: Not on file  Relationships  . Social connections:    Talks on phone: Not on file    Gets together: Not  on file    Attends religious service: Not on file    Active member of club or organization: Not on file    Attends meetings of clubs or organizations: Not on file    Relationship status: Not on file  Other Topics Concern  . Not on file  Social History Narrative  . Not on file   Family History: No family history on file. Allergies: Allergies  Allergen Reactions  . Lipitor [Atorvastatin] Other (See Comments)    Elevated LFTs  . Tylenol [Acetaminophen]    Medications: See med rec.  Review of Systems: No headache, visual changes, nausea, vomiting, diarrhea, constipation, dizziness, abdominal pain, skin rash, fevers, chills, night sweats, swollen lymph nodes, weight loss, chest pain, body aches, joint swelling, muscle aches, shortness of breath, mood changes, visual or auditory hallucinations.  Objective:    General: Well Developed, well nourished, and in no acute distress.  Neuro: Alert and oriented x3, extra-ocular muscles intact, sensation grossly intact. Cranial nerves II through XII are intact, motor, sensory, and coordinative functions are all intact. HEENT: Normocephalic, atraumatic, pupils equal round reactive to light, neck supple, no masses, no lymphadenopathy, thyroid nonpalpable. Oropharynx, nasopharynx, external ear canals are unremarkable. Skin: Warm and dry, no rashes noted.  Cardiac: Regular rate and rhythm, no murmurs rubs or gallops.  Respiratory: Clear to auscultation bilaterally. Not using accessory muscles, speaking in full sentences.  Abdominal: Soft, nontender, nondistended, positive bowel sounds, no masses, no organomegaly.  Left knee: Normal to inspection with no erythema or effusion or obvious bony abnormalities. Palpation normal with no warmth or joint line tenderness or patellar tenderness or condyle tenderness. Only a touch of tenderness at the patellar facets ROM normal in flexion and extension and lower leg rotation. Ligaments with solid consistent  endpoints including ACL, PCL, LCL, MCL. Negative Mcmurray's and provocative meniscal tests. Non painful patellar compression. Patellar and quadriceps tendons unremarkable. Hamstring and quadriceps strength is normal.  Procedure: Real-time Ultrasound Guided injection of the left knee Device: GE Logiq E  Verbal informed consent obtained.  Time-out conducted.  Noted no overlying erythema, induration, or other signs of local infection.  Skin prepped in a sterile fashion.  Local anesthesia: Topical Ethyl chloride.  With sterile technique and under real time ultrasound guidance:  1 cc Kenalog 40, 2 cc lidocaine, 2 cc bupivacaine injected easily Completed without difficulty  Pain immediately resolved suggesting accurate placement of the medication.  Advised to call if fevers/chills, erythema, induration, drainage, or persistent bleeding.  Images permanently stored and available for review in the ultrasound unit.  Impression: Technically successful ultrasound guided injection.  Impression and Recommendations:    The patient was counselled, risk factors were discussed, anticipatory guidance given.  Annual physical exam Annual physical as above.  Primary osteoarthritis of both knees Patellofemoral osteoarthritis. Adding x-rays, injection today. He is already doing NSAIDs and rehabilitation exercises.  Alcoholic hepatitis without ascites LFTs around 100 and stable. Cut back drinking about 50%. I do think we need annual liver ultrasound, we can do one today.   ___________________________________________ [name]. Benjamin Stain, M.D., ABFM., CAQSM. Primary Care and Sports Medicine [facility]  Adjunct Professor of Family Medicine  [employer]

## 2018-09-18 NOTE — Assessment & Plan Note (Signed)
Annual physical as above.  

## 2018-09-18 NOTE — Assessment & Plan Note (Signed)
Patellofemoral osteoarthritis. Adding x-rays, injection today. He is already doing NSAIDs and rehabilitation exercises.

## 2018-09-29 DIAGNOSIS — Z1211 Encounter for screening for malignant neoplasm of colon: Secondary | ICD-10-CM | POA: Diagnosis not present

## 2018-10-20 ENCOUNTER — Other Ambulatory Visit: Payer: Self-pay | Admitting: Sports Medicine

## 2018-10-29 ENCOUNTER — Telehealth: Payer: Self-pay | Admitting: Sports Medicine

## 2018-10-29 NOTE — Telephone Encounter (Signed)
Pt returned your call. He said that he did not get results from his Cologuard.Gabriel KitchenMarland Wong

## 2018-10-29 NOTE — Telephone Encounter (Signed)
Called pt back and left another vm asking for clarification regarding Cologuard since we have nothing in the chart for him.

## 2018-10-29 NOTE — Telephone Encounter (Signed)
Thank you :)

## 2018-10-30 ENCOUNTER — Telehealth: Payer: Self-pay | Admitting: Sports Medicine

## 2018-10-30 NOTE — Telephone Encounter (Signed)
I called [employer] and spoke with Miranda and the fax has been sent to another office. I gave our office fax number and it has been faxed. It is laying on your desk. Please advise.

## 2018-11-07 ENCOUNTER — Encounter: Payer: Self-pay | Admitting: Sports Medicine

## 2018-11-07 LAB — HM COLONOSCOPY

## 2018-11-17 ENCOUNTER — Other Ambulatory Visit: Payer: Self-pay | Admitting: Sports Medicine

## 2018-11-17 DIAGNOSIS — E781 Pure hyperglyceridemia: Secondary | ICD-10-CM

## 2018-12-03 ENCOUNTER — Encounter: Payer: Self-pay | Admitting: Sports Medicine

## 2018-12-03 ENCOUNTER — Ambulatory Visit (INDEPENDENT_AMBULATORY_CARE_PROVIDER_SITE_OTHER): Payer: BC Managed Care – PPO | Admitting: Sports Medicine

## 2018-12-03 ENCOUNTER — Other Ambulatory Visit: Payer: Self-pay

## 2018-12-03 DIAGNOSIS — J4530 Mild persistent asthma, uncomplicated: Secondary | ICD-10-CM

## 2018-12-03 DIAGNOSIS — T24209A Burn of second degree of unspecified site of unspecified lower limb, except ankle and foot, initial encounter: Secondary | ICD-10-CM

## 2018-12-03 MED ORDER — SILVER SULFADIAZINE 1 % EX CREA: 1.0000 "application " | TOPICAL_CREAM | Freq: Every day | CUTANEOUS | 0 refills | Status: DC
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
## 2018-12-03 NOTE — Assessment & Plan Note (Addendum)
Starting to have some mild symptoms, he will call me if they get worse, lungs are very clear today. I am happy to call in some azithromycin and prednisone if he needs.

## 2018-12-03 NOTE — Assessment & Plan Note (Signed)
Silver sulfadiazine cream, Tegaderm. He will change the dressing daily. He is up-to-date on tetanus, return as needed.

## 2018-12-15 ENCOUNTER — Other Ambulatory Visit: Payer: Self-pay | Admitting: Sports Medicine

## 2018-12-24 ENCOUNTER — Telehealth: Payer: Self-pay

## 2018-12-24 NOTE — Telephone Encounter (Signed)
Gabriel Wong states the FDA came out with more side effects. Trouble sleeping, crazy dreams, depression and agitation are the some of the symptoms he is having. Please advise.

## 2018-12-24 NOTE — Telephone Encounter (Signed)
Does he think the side effects are from a specific medication?

## 2018-12-25 NOTE — Telephone Encounter (Signed)
Trouble sleeping, crazy dreams, depression, and agitation in his case are likely not from Singulair.  It is likely from a primary mood disorder.  Happy to discuss it with him either virtually or in person.

## 2018-12-25 NOTE — Telephone Encounter (Signed)
Fair enough.

## 2018-12-25 NOTE — Telephone Encounter (Signed)
Yes, sorry. Singulair.

## 2018-12-25 NOTE — Telephone Encounter (Signed)
Called patient, he declines an appt. States that he is going to stop the Singulair and see if he feels better. States if SX do not go away then he will make an appt. FYI to PCP

## 2019-01-07 DIAGNOSIS — M546 Pain in thoracic spine: Secondary | ICD-10-CM | POA: Diagnosis not present

## 2019-01-07 DIAGNOSIS — M5441 Lumbago with sciatica, right side: Secondary | ICD-10-CM | POA: Diagnosis not present

## 2019-01-07 DIAGNOSIS — M542 Cervicalgia: Secondary | ICD-10-CM | POA: Diagnosis not present

## 2019-01-14 ENCOUNTER — Other Ambulatory Visit: Payer: Self-pay | Admitting: Sports Medicine

## 2019-01-28 DIAGNOSIS — M5441 Lumbago with sciatica, right side: Secondary | ICD-10-CM | POA: Diagnosis not present

## 2019-01-28 DIAGNOSIS — M542 Cervicalgia: Secondary | ICD-10-CM | POA: Diagnosis not present

## 2019-01-28 DIAGNOSIS — M546 Pain in thoracic spine: Secondary | ICD-10-CM | POA: Diagnosis not present

## 2019-01-30 ENCOUNTER — Ambulatory Visit (INDEPENDENT_AMBULATORY_CARE_PROVIDER_SITE_OTHER): Payer: BC Managed Care – PPO | Admitting: Sports Medicine

## 2019-01-30 DIAGNOSIS — J4530 Mild persistent asthma, uncomplicated: Secondary | ICD-10-CM

## 2019-01-30 MED ORDER — AZITHROMYCIN 250 MG PO TABS: ORAL_TABLET | ORAL | 0 refills | Status: DC

## 2019-01-30 NOTE — Progress Notes (Signed)
Virtual Visit via Telephone   I connected with  Gabriel Wong  on 01/30/19 by telephone/telehealth and verified that I am speaking with the correct person using two identifiers.   I discussed the limitations, risks, security and privacy concerns of performing an evaluation and management service by telephone, including the higher likelihood of inaccurate diagnosis and treatment, and the availability of in person appointments.  We also discussed the likely need of an additional face to face encounter for complete and high quality delivery of care.  I also discussed with the patient that there may be a patient responsible charge related to this service. The patient expressed understanding and wishes to proceed.  Provider location is either at home or medical facility. Patient location is at their home, different from provider location. People involved in care of the patient during this telehealth encounter were myself, my nurse/medical assistant, and my front office/scheduling team member.  Subjective:    CC: Feeling sick  HPI: For the past several days this pleasant 53 year old male has had increasing cough, facial pain and pressure, nasal discharge, he is using his Flonase without much improvement.  Symptoms are moderate, persistent.  I reviewed the past medical history, family history, social history, surgical history, and allergies today and no changes were needed.  Please see the problem list section below in epic for further details.  Past Medical History: Past Medical History:  Diagnosis Date  . Alcoholic hepatitis   . Anxiety   . Asthma   . Gout 04/28/2015  . Hyperlipemia   . Hypertension   . Insomnia   . Rosacea   . Seasonal allergies    Past Surgical History: Past Surgical History:  Procedure Laterality Date  . BACK SURGERY    . FISSURECTOMY     Social History: Social History   Socioeconomic History  . Marital status: Single    Spouse name: Not on file  .  Number of children: Not on file  . Years of education: Not on file  . Highest education level: Not on file  Occupational History  . Not on file  Social Needs  . Financial resource strain: Not on file  . Food insecurity    Worry: Not on file    Inability: Not on file  . Transportation needs    Medical: Not on file    Non-medical: Not on file  Tobacco Use  . Smoking status: Current Every Day Smoker    Packs/day: 0.25    Years: 25.00    Pack years: 6.25    Types: Cigarettes, Cigars  . Smokeless tobacco: Never Used  Substance and Sexual Activity  . Alcohol use: Yes  . Drug use: No  . Sexual activity: Not on file  Lifestyle  . Physical activity    Days per week: Not on file    Minutes per session: Not on file  . Stress: Not on file  Relationships  . Social Musician on phone: Not on file    Gets together: Not on file    Attends religious service: Not on file    Active member of club or organization: Not on file    Attends meetings of clubs or organizations: Not on file    Relationship status: Not on file  Other Topics Concern  . Not on file  Social History Narrative  . Not on file   Family History: No family history on file. Allergies: Allergies  Allergen Reactions  . Lipitor [Atorvastatin]  Other (See Comments)    Elevated LFTs  . Tylenol [Acetaminophen]    Medications: See med rec.  Review of Systems: No fevers, chills, night sweats, weight loss, chest pain, or shortness of breath.   Objective:    General: Speaking full sentences, no audible heavy breathing.  Sounds alert and appropriately interactive.  No other physical exam performed due to the non-face to face nature of this visit.  Impression and Recommendations:    Asthma, mild persistent Likely maxillary sinusitis with early asthma exacerbation. Patient declined steroids, he will continue Flonase, add azithromycin, return as needed.  I discussed the above assessment and treatment plan with  the patient. The patient was provided an opportunity to ask questions and all were answered. The patient agreed with the plan and demonstrated an understanding of the instructions.   The patient was advised to call back or seek an in-person evaluation if the symptoms worsen or if the condition fails to improve as anticipated.   I provided 25 minutes of non-face-to-face time during this encounter, 15 minutes of additional time was needed to gather information, review chart, records, communicate/coordinate with staff remotely, and complete documentation.   ___________________________________________ Gwen Her. Dianah Field, M.D., ABFM., CAQSM. Primary Care and Sports Medicine [facility]  Adjunct Professor of [employer]

## 2019-01-30 NOTE — Assessment & Plan Note (Signed)
Likely maxillary sinusitis with early asthma exacerbation. Patient declined steroids, he will continue Flonase, add azithromycin, return as needed.

## 2019-02-09 DIAGNOSIS — Z20828 Contact with and (suspected) exposure to other viral communicable diseases: Secondary | ICD-10-CM | POA: Diagnosis not present

## 2019-02-09 DIAGNOSIS — R05 Cough: Secondary | ICD-10-CM | POA: Diagnosis not present

## 2019-02-16 ENCOUNTER — Encounter: Payer: Self-pay | Admitting: Sports Medicine

## 2019-02-16 ENCOUNTER — Ambulatory Visit (INDEPENDENT_AMBULATORY_CARE_PROVIDER_SITE_OTHER): Payer: BC Managed Care – PPO | Admitting: Sports Medicine

## 2019-02-16 DIAGNOSIS — R0789 Other chest pain: Secondary | ICD-10-CM | POA: Insufficient documentation

## 2019-02-16 DIAGNOSIS — R0781 Pleurodynia: Secondary | ICD-10-CM | POA: Diagnosis not present

## 2019-02-16 DIAGNOSIS — U071 COVID-19: Secondary | ICD-10-CM | POA: Insufficient documentation

## 2019-02-16 NOTE — Assessment & Plan Note (Signed)
Rib contusion, patient declines any specific treatment, return as needed, no shortness of breath, no hemoptysis.

## 2019-02-16 NOTE — Progress Notes (Signed)
Virtual Visit via Telephone   I connected with  Gabriel Wong  on 02/16/19 by telephone/telehealth and verified that I am speaking with the correct person using two identifiers.   I discussed the limitations, risks, security and privacy concerns of performing an evaluation and management service by telephone, including the higher likelihood of inaccurate diagnosis and treatment, and the availability of in person appointments.  We also discussed the likely need of an additional face to face encounter for complete and high quality delivery of care.  I also discussed with the patient that there may be a patient responsible charge related to this service. The patient expressed understanding and wishes to proceed.  Provider location is either at home or medical facility. Patient location is at their home, different from provider location. People involved in care of the patient during this telehealth encounter were myself, my nurse/medical assistant, and my front office/scheduling team member.  Subjective:    CC: Rib contusion  HPI: Gabriel Wong had an accidental fall, hit the left side of his chest wall, he has pain that is fairly severe in a pinpoint area.  He has no hemoptysis, no shortness of breath, he is taking some ibuprofen which seems to be keeping his pain today.  Of note he was recently diagnosed with COVID-19.  Feeling well.  I reviewed the past medical history, family history, social history, surgical history, and allergies today and no changes were needed.  Please see the problem list section below in epic for further details.  Past Medical History: Past Medical History:  Diagnosis Date  . Alcoholic hepatitis   . Anxiety   . Asthma   . Gout 04/28/2015  . Hyperlipemia   . Hypertension   . Insomnia   . Rosacea   . Seasonal allergies    Past Surgical History: Past Surgical History:  Procedure Laterality Date  . BACK SURGERY    . FISSURECTOMY     Social History: Social  History   Socioeconomic History  . Marital status: Single    Spouse name: Not on file  . Number of children: Not on file  . Years of education: Not on file  . Highest education level: Not on file  Occupational History  . Not on file  Social Needs  . Financial resource strain: Not on file  . Food insecurity    Worry: Not on file    Inability: Not on file  . Transportation needs    Medical: Not on file    Non-medical: Not on file  Tobacco Use  . Smoking status: Current Every Day Smoker    Packs/day: 0.25    Years: 25.00    Pack years: 6.25    Types: Cigarettes, Cigars  . Smokeless tobacco: Never Used  Substance and Sexual Activity  . Alcohol use: Yes  . Drug use: No  . Sexual activity: Not on file  Lifestyle  . Physical activity    Days per week: Not on file    Minutes per session: Not on file  . Stress: Not on file  Relationships  . Social Musician on phone: Not on file    Gets together: Not on file    Attends religious service: Not on file    Active member of club or organization: Not on file    Attends meetings of clubs or organizations: Not on file    Relationship status: Not on file  Other Topics Concern  . Not on file  Social History Narrative  . Not on file   Family History: No family history on file. Allergies: Allergies  Allergen Reactions  . Lipitor [Atorvastatin] Other (See Comments)    Elevated LFTs  . Tylenol [Acetaminophen]    Medications: See med rec.  Review of Systems: No fevers, chills, night sweats, weight loss, chest pain, or shortness of breath.   Objective:    General: Speaking full sentences, no audible heavy breathing.  Sounds alert and appropriately interactive.  No other physical exam performed due to the non-face to face nature of this visit.  Impression and Recommendations:    Rib pain on left side Rib contusion, patient declines any specific treatment, return as needed, no shortness of breath, no hemoptysis.   COVID-19 Noted back on the 26th of last month, I have advised 2 weeks of quarantine.   I discussed the above assessment and treatment plan with the patient. The patient was provided an opportunity to ask questions and all were answered. The patient agreed with the plan and demonstrated an understanding of the instructions.   The patient was advised to call back or seek an in-person evaluation if the symptoms worsen or if the condition fails to improve as anticipated.   I provided 25 minutes of non-face-to-face time during this encounter, 15 minutes of additional time was needed to gather information, review chart, records, communicate/coordinate with staff remotely, and complete documentation.   ___________________________________________ Gwen Her. Dianah Field, M.D., ABFM., CAQSM. Primary Care and Sports Medicine [facility]  Adjunct Professor of [employer] [facility] [employer]

## 2019-02-16 NOTE — Assessment & Plan Note (Signed)
Noted back on the 26th of last month, I have advised 2 weeks of quarantine.

## 2019-02-19 ENCOUNTER — Other Ambulatory Visit: Payer: Self-pay | Admitting: Sports Medicine

## 2019-02-24 DIAGNOSIS — L400 Psoriasis vulgaris: Secondary | ICD-10-CM | POA: Diagnosis not present

## 2019-02-24 DIAGNOSIS — L2089 Other atopic dermatitis: Secondary | ICD-10-CM | POA: Diagnosis not present

## 2019-02-24 DIAGNOSIS — L918 Other hypertrophic disorders of the skin: Secondary | ICD-10-CM | POA: Diagnosis not present

## 2019-02-24 DIAGNOSIS — L718 Other rosacea: Secondary | ICD-10-CM | POA: Diagnosis not present

## 2019-04-14 ENCOUNTER — Other Ambulatory Visit: Payer: Self-pay

## 2019-04-14 ENCOUNTER — Other Ambulatory Visit: Payer: Self-pay | Admitting: Sports Medicine

## 2019-04-14 ENCOUNTER — Ambulatory Visit (INDEPENDENT_AMBULATORY_CARE_PROVIDER_SITE_OTHER): Payer: BC Managed Care – PPO | Admitting: Sports Medicine

## 2019-04-14 DIAGNOSIS — J4530 Mild persistent asthma, uncomplicated: Secondary | ICD-10-CM

## 2019-04-14 MED ORDER — DEXAMETHASONE 4 MG PO TABS: 4.0000 mg | ORAL_TABLET | Freq: Three times a day (TID) | ORAL | 0 refills | Status: DC

## 2019-04-14 MED ORDER — FLUTICASONE-SALMETEROL 250-50 MCG/DOSE IN AEPB: 1.0000 | INHALATION_SPRAY | Freq: Two times a day (BID) | RESPIRATORY_TRACT | 0 refills | Status: DC

## 2019-04-14 NOTE — Progress Notes (Signed)
Virtual Visit via WebEx/MyChart   I connected with  Gabriel Wong  on 04/14/19 via WebEx/MyChart/Doximity Video and verified that I am speaking with the correct person using two identifiers.   I discussed the limitations, risks, security and privacy concerns of performing an evaluation and management service by WebEx/MyChart/Doximity Video, including the higher likelihood of inaccurate diagnosis and treatment, and the availability of in person appointments.  We also discussed the likely need of an additional face to face encounter for complete and high quality delivery of care.  I also discussed with the patient that there may be a patient responsible charge related to this service. The patient expressed understanding and wishes to proceed.  Provider location is either at home or medical facility. Patient location is at their home, different from provider location. People involved in care of the patient during this telehealth encounter were myself, my nurse/medical assistant, and my front office/scheduling team member.  Subjective:    CC: Coughing  HPI: Gabriel Wong still has a little bit of a cough, see below for further details.  I reviewed the past medical history, family history, social history, surgical history, and allergies today and no changes were needed.  Please see the problem list section below in epic for further details.  Past Medical History: Past Medical History:  Diagnosis Date   Alcoholic hepatitis    Anxiety    Asthma    Gout 04/28/2015   Hyperlipemia    Hypertension    Insomnia    Rosacea    Seasonal allergies    Past Surgical History: Past Surgical History:  Procedure Laterality Date   BACK SURGERY     FISSURECTOMY     Social History: Social History   Socioeconomic History   Marital status: Single    Spouse name: Not on file   Number of children: Not on file   Years of education: Not on file   Highest education level: Not on file    Occupational History   Not on file  Tobacco Use   Smoking status: Current Every Day Smoker    Packs/day: 0.25    Years: 25.00    Pack years: 6.25    Types: Cigarettes, Cigars   Smokeless tobacco: Never Used  Substance and Sexual Activity   Alcohol use: Yes   Drug use: No   Sexual activity: Not on file  Other Topics Concern   Not on file  Social History Narrative   Not on file   Social Determinants of Health   Financial Resource Strain:    Difficulty of Paying Living Expenses: Not on file  Food Insecurity:    Worried About Running Out of Food in the Last Year: Not on file   [employer] of Food in the Last Year: Not on file  Transportation Needs:    Lack of Transportation (Medical): Not on file   Lack of Transportation (Non-Medical): Not on file  Physical Activity:    Days of Exercise per Week: Not on file   Minutes of Exercise per Session: Not on file  Stress:    Feeling of Stress : Not on file  Social Connections:    Frequency of Communication with Friends and Family: Not on file   Frequency of Social Gatherings with Friends and Family: Not on file   Attends Religious Services: Not on file   Active Member of Clubs or Organizations: Not on file   Attends Archivist Meetings: Not on file   Marital Status: Not  on file   Family History: No family history on file. Allergies: Allergies  Allergen Reactions   Lipitor [Atorvastatin] Other (See Comments)    Elevated LFTs   Tylenol [Acetaminophen]    Medications: See med rec.  Review of Systems: No fevers, chills, night sweats, weight loss, chest pain, or shortness of breath.   Objective:    General: Speaking full sentences, no audible heavy breathing.  Sounds alert and appropriately interactive.  Appears well.  Face symmetric.  Extraocular movements intact.  Pupils equal and round.  No nasal flaring or accessory muscle use visualized.  No other physical exam performed due to the  non-physical nature of this visit.  Impression and Recommendations:    Asthma, mild persistent Gabriel Wong was diagnosed with COVID-19 at the end of October, he is doing okay, but still has a bit of persistent cough.  He is also an asthmatic.  As the weather is gotten colder I think he is developing some chronic bronchoconstriction. Increasing Advair to 250/50 for the next 3 months, adding 5 days of Decadron. After the 3 months when the weather warms up we can drop back down to the 100/50 dose. If he is no better in a couple of weeks I be happy to see him in the office, I do need to lay a stethoscope on his chest.  I discussed the above assessment and treatment plan with the patient. The patient was provided an opportunity to ask questions and all were answered. The patient agreed with the plan and demonstrated an understanding of the instructions.   The patient was advised to call back or seek an in-person evaluation if the symptoms worsen or if the condition fails to improve as anticipated.   I provided 25 minutes of non-face-to-face time during this encounter, 15 minutes of additional time was needed to gather information, review chart, records, communicate/coordinate with staff remotely, troubleshooting the multiple errors that we get every time when trying to do video calls through the electronic medical record, WebEx, and Doximity, restart the encounter multiple times due to instability of the software, as well as complete documentation.   ___________________________________________ [name]. Benjamin Stain, M.D., ABFM., CAQSM. Primary Care and Sports Medicine [facility]  Adjunct Professor of Family Medicine  [employer]

## 2019-04-14 NOTE — Assessment & Plan Note (Signed)
Gabriel Wong was diagnosed with COVID-19 at the end of October, he is doing okay, but still has a bit of persistent cough.  He is also an asthmatic.  As the weather is gotten colder I think he is developing some chronic bronchoconstriction. Increasing Advair to 250/50 for the next 3 months, adding 5 days of Decadron. After the 3 months when the weather warms up we can drop back down to the 100/50 dose. If he is no better in a couple of weeks I be happy to see him in the office, I do need to lay a stethoscope on his chest.

## 2019-05-18 ENCOUNTER — Other Ambulatory Visit: Payer: Self-pay | Admitting: Sports Medicine

## 2019-06-12 ENCOUNTER — Other Ambulatory Visit: Payer: Self-pay | Admitting: Sports Medicine

## 2019-06-12 DIAGNOSIS — E781 Pure hyperglyceridemia: Secondary | ICD-10-CM

## 2019-06-25 ENCOUNTER — Other Ambulatory Visit: Payer: Self-pay | Admitting: Sports Medicine

## 2019-06-25 DIAGNOSIS — J4530 Mild persistent asthma, uncomplicated: Secondary | ICD-10-CM

## 2019-07-14 ENCOUNTER — Telehealth: Payer: Self-pay

## 2019-07-14 ENCOUNTER — Other Ambulatory Visit: Payer: Self-pay | Admitting: Sports Medicine

## 2019-07-14 NOTE — Telephone Encounter (Signed)
[facility] is trying to have all his medications filled at the same time. They are requesting a 90 day prescription for xanax. Please advise.

## 2019-07-14 NOTE — Telephone Encounter (Signed)
Did that earlier today.

## 2019-07-22 ENCOUNTER — Telehealth: Payer: Self-pay | Admitting: Sports Medicine

## 2019-07-22 NOTE — Telephone Encounter (Signed)
Patient wants you to call him about the COVID vaccine. He wants your honest opinion about it.

## 2019-07-22 NOTE — Telephone Encounter (Signed)
If we are going to do a telephone consultation for medical advice this needs to be in a telephone appointment slot as I am booked through the day.  My wholehearted recommendation to him however is "why the hell wouldn't you get the vaccine, the virus is deadly!"

## 2019-08-03 ENCOUNTER — Other Ambulatory Visit: Payer: Self-pay | Admitting: Sports Medicine

## 2019-08-11 ENCOUNTER — Other Ambulatory Visit: Payer: Self-pay | Admitting: Sports Medicine

## 2019-08-11 DIAGNOSIS — E781 Pure hyperglyceridemia: Secondary | ICD-10-CM

## 2019-08-17 ENCOUNTER — Other Ambulatory Visit: Payer: Self-pay | Admitting: Sports Medicine

## 2019-08-17 ENCOUNTER — Telehealth (INDEPENDENT_AMBULATORY_CARE_PROVIDER_SITE_OTHER): Payer: BC Managed Care – PPO | Admitting: Sports Medicine

## 2019-08-17 DIAGNOSIS — J4530 Mild persistent asthma, uncomplicated: Secondary | ICD-10-CM

## 2019-08-17 DIAGNOSIS — Z0000 Encounter for general adult medical examination without abnormal findings: Secondary | ICD-10-CM

## 2019-08-17 MED ORDER — DEXAMETHASONE 4 MG PO TABS: 4.0000 mg | ORAL_TABLET | Freq: Three times a day (TID) | ORAL | 0 refills | Status: DC

## 2019-08-17 MED ORDER — AZITHROMYCIN 250 MG PO TABS: ORAL_TABLET | ORAL | 0 refills | Status: DC

## 2019-08-17 NOTE — Assessment & Plan Note (Signed)
We lasted a physical June of 2020, he will come back June 4 of this year for annual physical.

## 2019-08-17 NOTE — Addendum Note (Signed)
Addended by: Monica Becton on: 08/17/2019 03:55 PM   Modules accepted: Orders

## 2019-08-17 NOTE — Assessment & Plan Note (Signed)
Guadalupe returns, he is having increasing runny nose, mild shortness of breath, coughing. He has known asthma, at the last visit we bumped him up to Advair 250/50. Because I think he is having a mild exacerbation we are going to do azithromycin and Decadron. When the allergy season is gone we can switch back down to the 100/50 mg dose. He will continue with cetirizine, as well as Singulair and Flonase.

## 2019-08-17 NOTE — Progress Notes (Signed)
   Virtual Visit via Telephone   I connected with  Gabriel Wong  on 08/17/19 by telephone/telehealth and verified that I am speaking with the correct person using two identifiers.   I discussed the limitations, risks, security and privacy concerns of performing an evaluation and management service by telephone, including the higher likelihood of inaccurate diagnosis and treatment, and the availability of in person appointments.  We also discussed the likely need of an additional face to face encounter for complete and high quality delivery of care.  I also discussed with the patient that there may be a patient responsible charge related to this service. The patient expressed understanding and wishes to proceed.  Provider location is either at home or medical facility. Patient location is at their home, different from provider location. People involved in care of the patient during this telehealth encounter were myself, my nurse/medical assistant, and my front office/scheduling team member.  Review of Systems: No fevers, chills, night sweats, weight loss, chest pain, or shortness of breath.   Objective Findings:    General: Speaking full sentences, no audible heavy breathing.  Sounds alert and appropriately interactive.    Independent interpretation of tests performed by another provider:   None.  Brief History, Exam, Impression, and Recommendations:    Asthma, mild persistent Ariv returns, he is having increasing runny nose, mild shortness of breath, coughing. He has known asthma, at the last visit we bumped him up to Advair 250/50. Because I think he is having a mild exacerbation we are going to do azithromycin and Decadron. When the allergy season is gone we can switch back down to the 100/50 mg dose. He will continue with cetirizine, as well as Singulair and Flonase.  Annual physical exam We lasted a physical June of 2020, he will come back June 4 of this year for annual  physical.   I discussed the above assessment and treatment plan with the patient. The patient was provided an opportunity to ask questions and all were answered. The patient agreed with the plan and demonstrated an understanding of the instructions.   The patient was advised to call back or seek an in-person evaluation if the symptoms worsen or if the condition fails to improve as anticipated.   I provided 30 minutes of face to face and non-face-to-face time during this encounter date, time was needed to gather information, review chart, records, communicate/coordinate with staff remotely, as well as complete documentation.   ___________________________________________ [name]. Benjamin Stain, M.D., ABFM., CAQSM. Primary Care and Sports Medicine [facility]  Adjunct Professor of Family Medicine  [employer]

## 2019-08-20 ENCOUNTER — Telehealth: Payer: Self-pay | Admitting: Sports Medicine

## 2019-08-21 ENCOUNTER — Other Ambulatory Visit: Payer: Self-pay | Admitting: Sports Medicine

## 2019-08-21 DIAGNOSIS — L719 Rosacea, unspecified: Secondary | ICD-10-CM

## 2019-08-26 ENCOUNTER — Emergency Department (HOSPITAL_BASED_OUTPATIENT_CLINIC_OR_DEPARTMENT_OTHER)
Admission: EM | Admit: 2019-08-26 | Discharge: 2019-08-26 | Disposition: A | Discharge status: Home / Self Care | Payer: BC Managed Care – PPO | Attending: Emergency Medicine | Admitting: Emergency Medicine

## 2019-08-26 ENCOUNTER — Emergency Department (HOSPITAL_BASED_OUTPATIENT_CLINIC_OR_DEPARTMENT_OTHER): Payer: BC Managed Care – PPO

## 2019-08-26 ENCOUNTER — Other Ambulatory Visit: Payer: Self-pay

## 2019-08-26 ENCOUNTER — Encounter (HOSPITAL_BASED_OUTPATIENT_CLINIC_OR_DEPARTMENT_OTHER): Payer: Self-pay

## 2019-08-26 DIAGNOSIS — W19XXXA Unspecified fall, initial encounter: Secondary | ICD-10-CM

## 2019-08-26 DIAGNOSIS — W1789XA Other fall from one level to another, initial encounter: Secondary | ICD-10-CM | POA: Insufficient documentation

## 2019-08-26 DIAGNOSIS — S022XXA Fracture of nasal bones, initial encounter for closed fracture: Secondary | ICD-10-CM | POA: Diagnosis not present

## 2019-08-26 DIAGNOSIS — I10 Essential (primary) hypertension: Secondary | ICD-10-CM | POA: Diagnosis not present

## 2019-08-26 DIAGNOSIS — Y9389 Activity, other specified: Secondary | ICD-10-CM | POA: Diagnosis not present

## 2019-08-26 DIAGNOSIS — Z8616 Personal history of COVID-19: Secondary | ICD-10-CM | POA: Insufficient documentation

## 2019-08-26 DIAGNOSIS — Z79899 Other long term (current) drug therapy: Secondary | ICD-10-CM | POA: Diagnosis not present

## 2019-08-26 DIAGNOSIS — Y92007 Garden or yard of unspecified non-institutional (private) residence as the place of occurrence of the external cause: Secondary | ICD-10-CM | POA: Insufficient documentation

## 2019-08-26 DIAGNOSIS — M542 Cervicalgia: Secondary | ICD-10-CM | POA: Diagnosis not present

## 2019-08-26 DIAGNOSIS — S0993XA Unspecified injury of face, initial encounter: Secondary | ICD-10-CM | POA: Diagnosis not present

## 2019-08-26 DIAGNOSIS — Y999 Unspecified external cause status: Secondary | ICD-10-CM | POA: Insufficient documentation

## 2019-08-26 DIAGNOSIS — F17210 Nicotine dependence, cigarettes, uncomplicated: Secondary | ICD-10-CM | POA: Insufficient documentation

## 2019-08-26 DIAGNOSIS — S199XXA Unspecified injury of neck, initial encounter: Secondary | ICD-10-CM | POA: Diagnosis not present

## 2019-08-26 DIAGNOSIS — J45909 Unspecified asthma, uncomplicated: Secondary | ICD-10-CM | POA: Diagnosis not present
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
## 2019-08-26 NOTE — Discharge Instructions (Signed)
Take Ibuprofen or Tylenol for pain as needed Apply ice to swollen area Keep wounds clean and dry Please follow up with your doctor

## 2019-08-26 NOTE — ED Triage Notes (Addendum)
Pt presents to triage with hard ccollar in place-steri strips to x2 to bridge of nose-sent from UC in [facility] with cspine reading that recommends Ct or MRI cervical spine-pt states he tripped/fell ~1/2am hit face on concrete planter-no LOC-NAD-steady gait-pt added he did remove ccollar en route

## 2019-08-27 DIAGNOSIS — S022XXD Fracture of nasal bones, subsequent encounter for fracture with routine healing: Secondary | ICD-10-CM | POA: Diagnosis not present

## 2019-09-28 ENCOUNTER — Ambulatory Visit (INDEPENDENT_AMBULATORY_CARE_PROVIDER_SITE_OTHER): Payer: BC Managed Care – PPO | Admitting: Sports Medicine

## 2019-09-28 ENCOUNTER — Other Ambulatory Visit: Payer: Self-pay

## 2019-09-28 ENCOUNTER — Telehealth: Payer: Self-pay | Admitting: Sports Medicine

## 2019-09-28 ENCOUNTER — Telehealth: Payer: Self-pay

## 2019-09-28 ENCOUNTER — Encounter: Payer: Self-pay | Admitting: Sports Medicine

## 2019-09-28 DIAGNOSIS — K7010 Alcoholic hepatitis without ascites: Secondary | ICD-10-CM | POA: Diagnosis not present

## 2019-09-28 DIAGNOSIS — Z0000 Encounter for general adult medical examination without abnormal findings: Secondary | ICD-10-CM

## 2019-09-28 DIAGNOSIS — M1A9XX0 Chronic gout, unspecified, without tophus (tophi): Secondary | ICD-10-CM

## 2019-09-28 DIAGNOSIS — Z113 Encounter for screening for infections with a predominantly sexual mode of transmission: Secondary | ICD-10-CM

## 2019-09-28 DIAGNOSIS — Z87891 Personal history of nicotine dependence: Secondary | ICD-10-CM | POA: Insufficient documentation

## 2019-09-28 DIAGNOSIS — E781 Pure hyperglyceridemia: Secondary | ICD-10-CM | POA: Diagnosis not present

## 2019-09-28 DIAGNOSIS — F17200 Nicotine dependence, unspecified, uncomplicated: Secondary | ICD-10-CM

## 2019-09-28 DIAGNOSIS — E782 Mixed hyperlipidemia: Secondary | ICD-10-CM

## 2019-09-28 DIAGNOSIS — N139 Obstructive and reflux uropathy, unspecified: Secondary | ICD-10-CM

## 2019-09-28 NOTE — Addendum Note (Signed)
Addended by: Monica Becton on: 09/28/2019 09:45 AM   Modules accepted: Orders

## 2019-09-28 NOTE — Progress Notes (Addendum)
Subjective:    CC: Annual Physical Exam  HPI:  This patient is here for their annual physical  I reviewed the past medical history, family history, social history, surgical history, and allergies today and no changes were needed.  Please see the problem list section below in epic for further details.  Past Medical History: Past Medical History:  Diagnosis Date  . Alcoholic hepatitis   . Anxiety   . Asthma   . Gout 04/28/2015  . Hyperlipemia   . Hypertension   . Insomnia   . Rosacea   . Seasonal allergies    Past Surgical History: Past Surgical History:  Procedure Laterality Date  . BACK SURGERY    . FISSURECTOMY     Social History: Social History   Socioeconomic History  . Marital status: Single    Spouse name: Not on file  . Number of children: Not on file  . Years of education: Not on file  . Highest education level: Not on file  Occupational History  . Not on file  Tobacco Use  . Smoking status: Current Every Day Smoker    Packs/day: 0.25    Years: 25.00    Pack years: 6.25    Types: Cigarettes, Cigars  . Smokeless tobacco: Never Used  Vaping Use  . Vaping Use: Never used  Substance and Sexual Activity  . Alcohol use: Yes    Comment: daily  . Drug use: Yes    Types: Marijuana  . Sexual activity: Not on file  Other Topics Concern  . Not on file  Social History Narrative  . Not on file   Social Determinants of Health   Financial Resource Strain:   . Difficulty of Paying Living Expenses:   Food Insecurity:   . Worried About Programme researcher, broadcasting/film/video in the Last Year:   . Barista in the Last Year:   Transportation Needs:   . Freight forwarder (Medical):   Marland Kitchen Lack of Transportation (Non-Medical):   Physical Activity:   . Days of Exercise per Week:   . Minutes of Exercise per Session:   Stress:   . Feeling of Stress :   Social Connections:   . Frequency of Communication with Friends and Family:   . Frequency of Social Gatherings with  Friends and Family:   . Attends Religious Services:   . Active Member of Clubs or Organizations:   . Attends Banker Meetings:   Marland Kitchen Marital Status:    Family History: No family history on file. Allergies: Allergies  Allergen Reactions  . Lipitor [Atorvastatin] Other (See Comments)    Elevated LFTs  . Tylenol [Acetaminophen]    Medications: See med rec.  Review of Systems: No headache, visual changes, nausea, vomiting, diarrhea, constipation, dizziness, abdominal pain, skin rash, fevers, chills, night sweats, swollen lymph nodes, weight loss, chest pain, body aches, joint swelling, muscle aches, shortness of breath, mood changes, visual or auditory hallucinations.  Objective:    General: Well Developed, well nourished, and in no acute distress.  Neuro: Alert and oriented x3, extra-ocular muscles intact, sensation grossly intact. Cranial nerves II through XII are intact, motor, sensory, and coordinative functions are all intact. HEENT: Normocephalic, atraumatic, pupils equal round reactive to light, neck supple, no masses, no lymphadenopathy, thyroid nonpalpable. Oropharynx, nasopharynx, external ear canals are unremarkable. Skin: Warm and dry, no rashes noted.  Cardiac: Regular rate and rhythm, no murmurs rubs or gallops.  Respiratory: Clear to auscultation bilaterally. Not using accessory  muscles, speaking in full sentences.  Abdominal: Soft, nontender, nondistended, positive bowel sounds, no masses, no organomegaly.  Musculoskeletal: Shoulder, elbow, wrist, hip, knee, ankle stable, and with full range of motion.  Impression and Recommendations:    The patient was counselled, risk factors were discussed, anticipatory guidance given.  Annual physical exam Annual physical as above. He did finish his Cologuard already, it was negative. Routine labs. Return to see me in a year.  Screening examination for STD (sexually transmitted disease) Ordering updated STD  screens  Smoker 30-pack-year history of smoking, still smoking. Ordering chest CT.  Mixed hyperlipidemia Lipids are moderately elevated, he is on several lipid-lowering medications, still declines a statin. We can recheck in 3 months.   ___________________________________________ Gwen Her. Dianah Field, M.D., ABFM., CAQSM. Primary Care and Sports Medicine [facility]  Adjunct Professor of [employer]

## 2019-09-28 NOTE — Assessment & Plan Note (Addendum)
30-pack-year history of smoking, still smoking. Ordering chest CT.

## 2019-09-28 NOTE — Assessment & Plan Note (Signed)
Annual physical as above. He did finish his Cologuard already, it was negative. Routine labs. Return to see me in a year.

## 2019-09-28 NOTE — Telephone Encounter (Signed)
Patient had a question about a CT SCAN that was ordered, stated that the coding was coded wrong and he didn't get a chance to get the CT scan done today, also had another personal question to ask as well, and wanted a call back regarding this. Please Advise.

## 2019-09-28 NOTE — Addendum Note (Signed)
Addended by: Monica Becton on: 09/28/2019 05:04 PM   Modules accepted: Orders

## 2019-09-28 NOTE — Telephone Encounter (Signed)
It was not coded wrong,: Radiology protocol does not use the most updated recommendations for lung cancer screening.  I am going to switch the order to just a regular CT scan rather than a screening CT.  [facility] radiology protocol use is aged 54 or higher, the current USPSTF protocols are age 48 or older.

## 2019-09-28 NOTE — Telephone Encounter (Signed)
CPT is [phone], patient is too young per protocol to have Lung cancer screen (must be 54 yo). Patient will be eligible in February 2022. Will discuss with patient and also provider since patient is asymptomatic and address from there.

## 2019-09-28 NOTE — Telephone Encounter (Signed)
Does not meet requirements for CT lung cancer screen due to age. Will auth CT w/o

## 2019-09-28 NOTE — Assessment & Plan Note (Signed)
Ordering updated STD screens

## 2019-09-29 LAB — COMPLETE METABOLIC PANEL WITH GFR
AG Ratio: 1.9 (calc) (ref 1.0–2.5)
ALT: 129 U/L — ABNORMAL HIGH (ref 9–46)
AST: 101 U/L — ABNORMAL HIGH (ref 10–35)
Albumin: 4.8 g/dL (ref 3.6–5.1)
Alkaline phosphatase (APISO): 54 U/L (ref 35–144)
BUN: 19 mg/dL (ref 7–25)
CO2: 29 mmol/L (ref 20–32)
Calcium: 10.5 mg/dL — ABNORMAL HIGH (ref 8.6–10.3)
Chloride: 102 mmol/L (ref 98–110)
Creat: 0.93 mg/dL (ref 0.70–1.33)
GFR, Est African American: 107 mL/min/{1.73_m2} (ref >=60)
GFR, Est Non African American: 93 mL/min/{1.73_m2} (ref >=60)
Globulin: 2.5 g/dL (calc) (ref 1.9–3.7)
Glucose, Bld: 111 mg/dL — ABNORMAL HIGH (ref 65–99)
Potassium: 3.3 mmol/L — ABNORMAL LOW (ref 3.5–5.3)
Sodium: 142 mmol/L (ref 135–146)
Total Bilirubin: 1.1 mg/dL (ref 0.2–1.2)
Total Protein: 7.3 g/dL (ref 6.1–8.1)

## 2019-09-29 LAB — TSH: TSH: 1.13 mIU/L (ref 0.40–4.50)

## 2019-09-29 LAB — CBC
HCT: 45.4 % (ref 38.5–50.0)
Hemoglobin: 16.2 g/dL (ref 13.2–17.1)
MCH: 34.5 pg — ABNORMAL HIGH (ref 27.0–33.0)
MCHC: 35.7 g/dL (ref 32.0–36.0)
MCV: 96.6 fL (ref 80.0–100.0)
MPV: 11.7 fL (ref 7.5–12.5)
Platelets: 194 10*3/uL (ref 140–400)
RBC: 4.7 10*6/uL (ref 4.20–5.80)
RDW: 12.7 % (ref 11.0–15.0)
WBC: 5.7 10*3/uL (ref 3.8–10.8)

## 2019-09-29 LAB — HEMOGLOBIN A1C
Hgb A1c MFr Bld: 5 % of total Hgb (ref <5.7)
Mean Plasma Glucose: 97 (calc)
eAG (mmol/L): 5.4 (calc)

## 2019-09-29 LAB — HIV ANTIBODY (ROUTINE TESTING W REFLEX): HIV 1&2 Ab, 4th Generation: NONREACTIVE

## 2019-09-29 LAB — PSA, TOTAL AND FREE
PSA, % Free: 18 % (calc) — ABNORMAL LOW (ref >25)
PSA, Free: 0.3 ng/mL
PSA, Total: 1.7 ng/mL (ref <=4.0)

## 2019-09-29 LAB — RPR: RPR Ser Ql: NONREACTIVE

## 2019-09-29 LAB — HSV 2 ANTIBODY, IGG: HSV 2 Glycoprotein G Ab, IgG: <0.9 index

## 2019-09-29 LAB — LIPID PANEL W/REFLEX DIRECT LDL
Cholesterol: 231 mg/dL — ABNORMAL HIGH (ref <200)
HDL: 46 mg/dL (ref >=40)
LDL Cholesterol (Calc): 151 mg/dL (calc) — ABNORMAL HIGH
Non-HDL Cholesterol (Calc): 185 mg/dL (calc) — ABNORMAL HIGH (ref <130)
Total CHOL/HDL Ratio: 5 (calc) — ABNORMAL HIGH (ref <5.0)
Triglycerides: 195 mg/dL — ABNORMAL HIGH (ref <150)

## 2019-09-29 LAB — HEPATITIS PANEL, ACUTE
Hep A IgM: NONREACTIVE
Hep B C IgM: NONREACTIVE
Hepatitis B Surface Ag: NONREACTIVE
Hepatitis C Ab: NONREACTIVE
SIGNAL TO CUT-OFF: 0.01 (ref <1.00)

## 2019-09-29 LAB — URIC ACID: Uric Acid, Serum: 5.8 mg/dL (ref 4.0–8.0)

## 2019-09-29 NOTE — Assessment & Plan Note (Signed)
Lipids are moderately elevated, he is on several lipid-lowering medications, still declines a statin. We can recheck in 3 months.

## 2019-10-23 DIAGNOSIS — M546 Pain in thoracic spine: Secondary | ICD-10-CM | POA: Diagnosis not present

## 2019-10-23 DIAGNOSIS — M542 Cervicalgia: Secondary | ICD-10-CM | POA: Diagnosis not present

## 2019-10-23 DIAGNOSIS — M5441 Lumbago with sciatica, right side: Secondary | ICD-10-CM | POA: Diagnosis not present

## 2019-11-07 ENCOUNTER — Other Ambulatory Visit: Payer: Self-pay | Admitting: Sports Medicine

## 2019-11-07 DIAGNOSIS — E781 Pure hyperglyceridemia: Secondary | ICD-10-CM

## 2019-11-07 DIAGNOSIS — J4530 Mild persistent asthma, uncomplicated: Secondary | ICD-10-CM

## 2019-11-09 ENCOUNTER — Telehealth: Payer: Self-pay

## 2019-11-09 DIAGNOSIS — M48061 Spinal stenosis, lumbar region without neurogenic claudication: Secondary | ICD-10-CM

## 2019-11-09 DIAGNOSIS — M4807 Spinal stenosis, lumbosacral region: Secondary | ICD-10-CM

## 2019-11-09 NOTE — Telephone Encounter (Signed)
Pt called stating if provider can authorize him to get an epidural injection at [facility] imaging today. Per pt having severe back pain, has been putting ice/heat and taking ibuprofen for the past week. He's having a hard time moving around during work. Pt was offered multiple times to schedule an appointment with provider, however declined. Feels that provider is well aware of his condition and that you're a buddy of his. Pt does not want to do another MRI or visit. Pls advise, thanks.

## 2019-11-09 NOTE — Telephone Encounter (Signed)
Happy to order the epidural, his MRIs from 2016, generally an MRI is needed within 3 years, so we will see if [facility] imaging is okay during his injection.  Orders placed, please contact [place] imaging for scheduling.

## 2019-11-10 NOTE — Telephone Encounter (Signed)
Patient already scheduled for 8/3

## 2019-11-16 ENCOUNTER — Other Ambulatory Visit: Payer: Self-pay

## 2019-11-16 ENCOUNTER — Ambulatory Visit (INDEPENDENT_AMBULATORY_CARE_PROVIDER_SITE_OTHER): Payer: BC Managed Care – PPO

## 2019-11-16 DIAGNOSIS — M48061 Spinal stenosis, lumbar region without neurogenic claudication: Secondary | ICD-10-CM

## 2019-11-16 DIAGNOSIS — M5116 Intervertebral disc disorders with radiculopathy, lumbar region: Secondary | ICD-10-CM | POA: Diagnosis not present

## 2019-11-16 DIAGNOSIS — M4726 Other spondylosis with radiculopathy, lumbar region: Secondary | ICD-10-CM | POA: Diagnosis not present

## 2019-11-16 DIAGNOSIS — M4807 Spinal stenosis, lumbosacral region: Secondary | ICD-10-CM | POA: Diagnosis not present

## 2019-11-16 DIAGNOSIS — M2578 Osteophyte, vertebrae: Secondary | ICD-10-CM | POA: Diagnosis not present

## 2019-11-16 NOTE — Telephone Encounter (Signed)
Creek has lumbar spinal stenosis, he has had several epidurals with good success, we are planning another epidural, he will need an updated MRI for procedural planning, he has had x-rays in the past that were unrevealing.  There is mild progressive weakness.  He has failed greater than 6 weeks of physician directed conservative measures in the past.

## 2019-11-17 ENCOUNTER — Ambulatory Visit
Admission: RE | Admit: 2019-11-17 | Discharge: 2019-11-17 | Disposition: A | Discharge status: Home / Self Care | Payer: BC Managed Care – PPO | Source: Ambulatory Visit | Attending: Sports Medicine | Admitting: Sports Medicine

## 2019-11-17 DIAGNOSIS — M48061 Spinal stenosis, lumbar region without neurogenic claudication: Secondary | ICD-10-CM | POA: Diagnosis not present

## 2019-11-17 MED ORDER — METHYLPREDNISOLONE ACETATE 40 MG/ML INJ SUSP (RADIOLOG
120.0000 mg | Freq: Once | INTRAMUSCULAR | Status: AC
  Administered 2019-11-17: 120 mg via EPIDURAL

## 2019-11-17 MED ORDER — IOPAMIDOL (ISOVUE-M 200) INJECTION 41%
1.0000 mL | Freq: Once | INTRAMUSCULAR | Status: AC
  Administered 2019-11-17: 1 mL via EPIDURAL

## 2019-11-17 NOTE — Discharge Instructions (Signed)

## 2019-11-19 ENCOUNTER — Other Ambulatory Visit: Payer: Self-pay | Admitting: Sports Medicine

## 2019-12-10 DIAGNOSIS — M542 Cervicalgia: Secondary | ICD-10-CM | POA: Diagnosis not present

## 2019-12-10 DIAGNOSIS — M546 Pain in thoracic spine: Secondary | ICD-10-CM | POA: Diagnosis not present

## 2019-12-10 DIAGNOSIS — M5441 Lumbago with sciatica, right side: Secondary | ICD-10-CM | POA: Diagnosis not present

## 2019-12-15 DIAGNOSIS — L820 Inflamed seborrheic keratosis: Secondary | ICD-10-CM | POA: Diagnosis not present

## 2019-12-15 DIAGNOSIS — D1801 Hemangioma of skin and subcutaneous tissue: Secondary | ICD-10-CM | POA: Diagnosis not present

## 2019-12-15 DIAGNOSIS — L814 Other melanin hyperpigmentation: Secondary | ICD-10-CM | POA: Diagnosis not present

## 2019-12-15 DIAGNOSIS — D239 Other benign neoplasm of skin, unspecified: Secondary | ICD-10-CM | POA: Diagnosis not present

## 2019-12-15 DIAGNOSIS — X32XXXS Exposure to sunlight, sequela: Secondary | ICD-10-CM | POA: Diagnosis not present

## 2019-12-23 ENCOUNTER — Telehealth: Payer: Self-pay

## 2019-12-23 DIAGNOSIS — M48061 Spinal stenosis, lumbar region without neurogenic claudication: Secondary | ICD-10-CM

## 2019-12-23 NOTE — Telephone Encounter (Signed)
Epidural #2 ordered.  Please contact [facility] were imaging for scheduling.  If he does not get good relief we will consider multilevel facet injections.

## 2019-12-23 NOTE — Telephone Encounter (Signed)
Marquice states he didn't get a lot of relief with the lower back pain with the epidural. He would like to proceed with another. Please advise.   Pain is 8/10

## 2019-12-29 ENCOUNTER — Ambulatory Visit
Admission: RE | Admit: 2019-12-29 | Discharge: 2019-12-29 | Disposition: A | Discharge status: Home / Self Care | Payer: BC Managed Care – PPO | Source: Ambulatory Visit | Attending: Sports Medicine | Admitting: Sports Medicine

## 2019-12-29 ENCOUNTER — Other Ambulatory Visit: Payer: Self-pay

## 2019-12-29 DIAGNOSIS — M48061 Spinal stenosis, lumbar region without neurogenic claudication: Secondary | ICD-10-CM

## 2019-12-29 DIAGNOSIS — M47817 Spondylosis without myelopathy or radiculopathy, lumbosacral region: Secondary | ICD-10-CM | POA: Diagnosis not present

## 2019-12-29 MED ORDER — IOPAMIDOL (ISOVUE-M 200) INJECTION 41%
1.0000 mL | Freq: Once | INTRAMUSCULAR | Status: AC
  Administered 2019-12-29: 1 mL via EPIDURAL

## 2019-12-29 MED ORDER — METHYLPREDNISOLONE ACETATE 40 MG/ML INJ SUSP (RADIOLOG
120.0000 mg | Freq: Once | INTRAMUSCULAR | Status: AC
  Administered 2019-12-29: 120 mg via EPIDURAL

## 2020-01-28 ENCOUNTER — Other Ambulatory Visit: Payer: Self-pay | Admitting: Sports Medicine

## 2020-01-28 DIAGNOSIS — J4530 Mild persistent asthma, uncomplicated: Secondary | ICD-10-CM

## 2020-01-29 ENCOUNTER — Ambulatory Visit (INDEPENDENT_AMBULATORY_CARE_PROVIDER_SITE_OTHER): Payer: BC Managed Care – PPO | Admitting: Sports Medicine

## 2020-01-29 DIAGNOSIS — Z23 Encounter for immunization: Secondary | ICD-10-CM | POA: Diagnosis not present

## 2020-01-29 MED ORDER — FLUTICASONE-SALMETEROL 250-50 MCG/DOSE IN AEPB: 1.0000 | INHALATION_SPRAY | Freq: Two times a day (BID) | RESPIRATORY_TRACT | 0 refills | Status: DC

## 2020-01-29 MED ORDER — ALPRAZOLAM 0.25 MG PO TABS
0.2500 mg | ORAL_TABLET | Freq: Two times a day (BID) | ORAL | 0 refills | Status: DC | PRN
Start: 2020-01-29 — End: 2020-08-04

## 2020-01-29 NOTE — Telephone Encounter (Signed)
Called to pharmacy 

## 2020-01-29 NOTE — Addendum Note (Signed)
Addended by: Monica Becton on: 01/29/2020 05:06 PM   Modules accepted: Orders

## 2020-01-29 NOTE — Addendum Note (Signed)
Addended by: Chalmers Cater on: 01/29/2020 03:16 PM   Modules accepted: Orders

## 2020-01-29 NOTE — Addendum Note (Signed)
Addended by: Chalmers Cater on: 01/29/2020 02:53 PM   Modules accepted: Orders

## 2020-02-03 ENCOUNTER — Other Ambulatory Visit: Payer: Self-pay | Admitting: Sports Medicine

## 2020-02-04 ENCOUNTER — Telehealth: Payer: Self-pay

## 2020-02-04 NOTE — Telephone Encounter (Signed)
PA for Vascepa submitted through CoverMyMeds.

## 2020-02-10 MED ORDER — ICOSAPENT ETHYL 1 G PO CAPS: 2.0000 g | ORAL_CAPSULE | Freq: Two times a day (BID) | ORAL | 3 refills | Status: DC

## 2020-02-10 MED ORDER — VASCEPA 1 G PO CAPS
2.0000 g | ORAL_CAPSULE | Freq: Two times a day (BID) | ORAL | 3 refills | Status: DC
Start: 2020-02-10 — End: 2020-10-28

## 2020-02-10 NOTE — Telephone Encounter (Signed)
PA for Vascepa denied due to not having tried and failed Vascepa brand. Paperwork in basket.

## 2020-02-10 NOTE — Telephone Encounter (Addendum)
Vascepa denied because he has not tried and failed Vascepa?  Am I hearing this correctly?  Do I just send in branded Vascepa?

## 2020-02-11 NOTE — Telephone Encounter (Signed)
I sent it in specifically marking "dispense as written" as well.

## 2020-02-11 NOTE — Telephone Encounter (Signed)
The answer is yes. Also the brand Vascepa will need a PA.

## 2020-02-12 NOTE — Telephone Encounter (Signed)
Vascepa approved.  Pharmacy notified.

## 2020-03-01 DIAGNOSIS — H5315 Visual distortions of shape and size: Secondary | ICD-10-CM | POA: Diagnosis not present

## 2020-03-01 DIAGNOSIS — H35722 Serous detachment of retinal pigment epithelium, left eye: Secondary | ICD-10-CM | POA: Diagnosis not present

## 2020-03-01 DIAGNOSIS — H35712 Central serous chorioretinopathy, left eye: Secondary | ICD-10-CM | POA: Diagnosis not present

## 2020-03-01 DIAGNOSIS — H5319 Other subjective visual disturbances: Secondary | ICD-10-CM | POA: Diagnosis not present

## 2020-03-16 ENCOUNTER — Telehealth (INDEPENDENT_AMBULATORY_CARE_PROVIDER_SITE_OTHER): Payer: BC Managed Care – PPO | Admitting: Family Medicine

## 2020-03-16 ENCOUNTER — Encounter: Payer: Self-pay | Admitting: Family Medicine

## 2020-03-16 DIAGNOSIS — J4530 Mild persistent asthma, uncomplicated: Secondary | ICD-10-CM

## 2020-03-16 DIAGNOSIS — J0100 Acute maxillary sinusitis, unspecified: Secondary | ICD-10-CM

## 2020-03-16 MED ORDER — AZITHROMYCIN 250 MG PO TABS: ORAL_TABLET | ORAL | 0 refills | Status: DC

## 2020-03-16 NOTE — Assessment & Plan Note (Signed)
He has done well with azithromycin previously and will start this for treatment of his sinus infection.   Recommend increased fluids and may use short term decongestants as needed.  Instructed to schedule follow up if symptoms worsen or are not resolving.

## 2020-03-16 NOTE — Progress Notes (Signed)
Gabriel Wong - 54 y.o. male MRN [id number]  Date of birth: [birth year]   This visit type was conducted due to national recommendations for restrictions regarding the COVID-19 Pandemic (e.g. social distancing).  This format is felt to be most appropriate for this patient at this time.  All issues noted in this document were discussed and addressed.  No physical exam was performed (except for noted visual exam findings with Video Visits).  I discussed the limitations of evaluation and management by telemedicine and the availability of in person appointments. The patient expressed understanding and agreed to proceed.  I connected with@ on 03/16/20 at  1:40 PM EST by a video enabled telemedicine application and verified that I am speaking with the correct person using two identifiers.  Present at visit: Everrett Coombe, DO [name]   Patient Location: [facility] [address] [place] [address]   Provider location:   [facility]  Chief Complaint  Patient presents with  . Sinusitis    HPI  Gabriel Wong is a 54 y.o. male who presents via audio/video conferencing for a telehealth visit today.  He has complaint of sinus pain, congestion, pain in upper teeth, and thickened nasal secretions.  Symptoms started about 4 days ago and have been worsening since onset.  He has tried a sinus rinse which helped temporarily.  He reports having this annually around spring and fall.  He denies fever, chills, body aches, cough, shortness of breath, or fatigue.    ROS:  A comprehensive ROS was completed and negative except as noted per HPI  Past Medical History:  Diagnosis Date  . Alcoholic hepatitis   . Anxiety   . Asthma   . Gout 04/28/2015  . Hyperlipemia   . Hypertension   . Insomnia   . Rosacea   . Seasonal allergies     Past Surgical History:  Procedure Laterality Date  . BACK SURGERY    . FISSURECTOMY      History reviewed. No pertinent family history.  Social History    Socioeconomic History  . Marital status: Single    Spouse name: Not on file  . Number of children: Not on file  . Years of education: Not on file  . Highest education level: Not on file  Occupational History  . Not on file  Tobacco Use  . Smoking status: Current Every Day Smoker    Packs/day: 0.25    Years: 25.00    Pack years: 6.25    Types: Cigarettes, Cigars  . Smokeless tobacco: Never Used  Vaping Use  . Vaping Use: Never used  Substance and Sexual Activity  . Alcohol use: Yes    Comment: daily  . Drug use: Yes    Types: Marijuana  . Sexual activity: Not on file  Other Topics Concern  . Not on file  Social History Narrative  . Not on file   Social Determinants of Health   Financial Resource Strain:   . Difficulty of Paying Living Expenses: Not on file  Food Insecurity:   . Worried About Programme researcher, broadcasting/film/video in the Last Year: Not on file  . Ran Out of Food in the Last Year: Not on file  Transportation Needs:   . Lack of Transportation (Medical): Not on file  . Lack of Transportation (Non-Medical): Not on file  Physical Activity:   . Days of Exercise per Week: Not on file  . Minutes of Exercise per Session: Not on file  Stress:   .  Feeling of Stress : Not on file  Social Connections:   . Frequency of Communication with Friends and Family: Not on file  . Frequency of Social Gatherings with Friends and Family: Not on file  . Attends Religious Services: Not on file  . Active Member of Clubs or Organizations: Not on file  . Attends Banker Meetings: Not on file  . Marital Status: Not on file  Intimate Partner Violence:   . Fear of Current or Ex-Partner: Not on file  . Emotionally Abused: Not on file  . Physically Abused: Not on file  . Sexually Abused: Not on file     Current Outpatient Medications:  .  albuterol (VENTOLIN HFA) 108 (90 Base) MCG/ACT inhaler, INHALE 2 PUFFS INTO THE LUNGS EVERY 4 TO 6 HOURS ONLY AS NEEDED FOR SHORTNESS OF BREATH  OR WHEEZING., Disp: 18 Inhaler, Rfl: 3 .  allopurinol (ZYLOPRIM) 300 MG tablet, TAKE ONE TABLET BY MOUTH EVERY DAY, Disp: 90 tablet, Rfl: 1 .  ALPRAZolam (XANAX) 0.25 MG tablet, Take 1 tablet (0.25 mg total) by mouth 2 (two) times daily as needed., Disp: 180 tablet, Rfl: 0 .  amLODipine (NORVASC) 5 MG tablet, TAKE ONE TABLET BY MOUTH EVERY DAY, Disp: 90 tablet, Rfl: 1 .  Azelaic Acid 15 % cream, After skin is thoroughly washed and patted dry, gently massage a thin film of azelaic acid cream into the affected area 1-2x daily., Disp: 50 g, Rfl: 3 .  azithromycin (ZITHROMAX) 250 MG tablet, 5 day Z-pak dosing, Disp: 6 tablet, Rfl: 0 .  colchicine 0.6 MG tablet, Take 1 tablet (0.6 mg total) by mouth daily as needed., Disp: 90 tablet, Rfl: 1 .  dexamethasone (DECADRON) 4 MG tablet, Take 1 tablet (4 mg total) by mouth 3 (three) times daily., Disp: 15 tablet, Rfl: 0 .  eplerenone (INSPRA) 25 MG tablet, TAKE ONE TABLET BY MOUTH EVERY DAY, Disp: 90 tablet, Rfl: 1 .  ezetimibe (ZETIA) 10 MG tablet, TAKE ONE TABLET BY MOUTH EVERY DAY, Disp: 90 tablet, Rfl: 1 .  fenofibrate 160 MG tablet, TAKE ONE TABLET BY MOUTH EVERY DAY, Disp: 90 tablet, Rfl: 3 .  fluticasone (FLONASE) 50 MCG/ACT nasal spray, Place 1 spray into both nostrils daily., Disp: 48 g, Rfl: 3 .  Fluticasone-Salmeterol (ADVAIR DISKUS) 250-50 MCG/DOSE AEPB, Inhale 1 puff into the lungs 2 (two) times daily., Disp: 180 each, Rfl: 0 .  irbesartan (AVAPRO) 300 MG tablet, TAKE ONE TABLET BY MOUTH EVERY DAY, Disp: 90 tablet, Rfl: 1 .  lansoprazole (PREVACID) 30 MG capsule, TAKE ONE CAPSULE BY MOUTH TWICE DAILY BEFORE MEALS, Disp: 180 capsule, Rfl: 1 .  montelukast (SINGULAIR) 10 MG tablet, TAKE ONE TABLET BY MOUTH AT BEDTIME, Disp: 90 tablet, Rfl: 1 .  VASCEPA 1 g capsule, Take 2 capsules (2 g total) by mouth 2 (two) times daily., Disp: 360 capsule, Rfl: 3  EXAM:  VITALS per patient if applicable: Ht 5\' 10"  (1.778 m)   Wt 195 lb (88.5 kg)   BMI 27.98  kg/m   GENERAL: alert, oriented, appears well and in no acute distress  HEENT: atraumatic, conjunttiva clear, no obvious abnormalities on inspection of external nose and ears  NECK: normal movements of the head and neck  LUNGS: on inspection no signs of respiratory distress, breathing rate appears normal, no obvious gross SOB, gasping or wheezing  CV: no obvious cyanosis  MS: moves all visible extremities without noticeable abnormality  PSYCH/NEURO: pleasant and cooperative, no obvious depression or anxiety,  speech and thought processing grossly intact  ASSESSMENT AND PLAN:  Discussed the following assessment and plan:  Acute maxillary sinusitis He has done well with azithromycin previously and will start this for treatment of his sinus infection.   Recommend increased fluids and may use short term decongestants as needed.  Instructed to schedule follow up if symptoms worsen or are not resolving.       I discussed the assessment and treatment plan with the patient. The patient was provided an opportunity to ask questions and all were answered. The patient agreed with the plan and demonstrated an understanding of the instructions.   The patient was advised to call back or seek an in-person evaluation if the symptoms worsen or if the condition fails to improve as anticipated.    Everrett Coombe, DO

## 2020-03-16 NOTE — Progress Notes (Signed)
Sinus headache x 3 days.  Hx of sinus infection  Purchased nasal mist wash. Flonase is not working.

## 2020-03-22 DIAGNOSIS — H35713 Central serous chorioretinopathy, bilateral: Secondary | ICD-10-CM | POA: Diagnosis not present

## 2020-03-22 DIAGNOSIS — H35052 Retinal neovascularization, unspecified, left eye: Secondary | ICD-10-CM | POA: Diagnosis not present

## 2020-03-22 DIAGNOSIS — H43392 Other vitreous opacities, left eye: Secondary | ICD-10-CM | POA: Diagnosis not present

## 2020-03-22 DIAGNOSIS — H35723 Serous detachment of retinal pigment epithelium, bilateral: Secondary | ICD-10-CM | POA: Diagnosis not present

## 2020-03-24 DIAGNOSIS — M5441 Lumbago with sciatica, right side: Secondary | ICD-10-CM | POA: Diagnosis not present

## 2020-03-24 DIAGNOSIS — M546 Pain in thoracic spine: Secondary | ICD-10-CM | POA: Diagnosis not present

## 2020-03-24 DIAGNOSIS — M542 Cervicalgia: Secondary | ICD-10-CM | POA: Diagnosis not present

## 2020-03-29 DIAGNOSIS — H35052 Retinal neovascularization, unspecified, left eye: Secondary | ICD-10-CM | POA: Diagnosis not present

## 2020-04-20 ENCOUNTER — Telehealth: Payer: Self-pay

## 2020-04-20 NOTE — Telephone Encounter (Signed)
[place] state he does take ibuprofen 600 mg most days a week. He wanted to know if this was ok to take daily.

## 2020-04-20 NOTE — Telephone Encounter (Signed)
Patient advised.

## 2020-04-20 NOTE — Telephone Encounter (Signed)
That is fine as long as it is taken with food and he keeps himself hydrated.  He also should avoid any other anti-inflammatories when taking the ibuprofen.

## 2020-04-21 DIAGNOSIS — M5441 Lumbago with sciatica, right side: Secondary | ICD-10-CM | POA: Diagnosis not present

## 2020-04-21 DIAGNOSIS — M542 Cervicalgia: Secondary | ICD-10-CM | POA: Diagnosis not present

## 2020-04-21 DIAGNOSIS — M546 Pain in thoracic spine: Secondary | ICD-10-CM | POA: Diagnosis not present

## 2020-04-26 DIAGNOSIS — L820 Inflamed seborrheic keratosis: Secondary | ICD-10-CM | POA: Diagnosis not present

## 2020-04-26 DIAGNOSIS — L400 Psoriasis vulgaris: Secondary | ICD-10-CM | POA: Diagnosis not present

## 2020-04-26 DIAGNOSIS — Z79899 Other long term (current) drug therapy: Secondary | ICD-10-CM | POA: Diagnosis not present

## 2020-04-26 DIAGNOSIS — L84 Corns and callosities: Secondary | ICD-10-CM | POA: Diagnosis not present

## 2020-04-26 DIAGNOSIS — L603 Nail dystrophy: Secondary | ICD-10-CM | POA: Diagnosis not present

## 2020-05-05 DIAGNOSIS — M5441 Lumbago with sciatica, right side: Secondary | ICD-10-CM | POA: Diagnosis not present

## 2020-05-05 DIAGNOSIS — M546 Pain in thoracic spine: Secondary | ICD-10-CM | POA: Diagnosis not present

## 2020-05-05 DIAGNOSIS — M542 Cervicalgia: Secondary | ICD-10-CM | POA: Diagnosis not present

## 2020-05-09 ENCOUNTER — Other Ambulatory Visit: Payer: Self-pay | Admitting: Sports Medicine

## 2020-05-09 DIAGNOSIS — J4530 Mild persistent asthma, uncomplicated: Secondary | ICD-10-CM

## 2020-05-24 ENCOUNTER — Encounter: Payer: Self-pay | Admitting: Family Medicine

## 2020-05-24 ENCOUNTER — Telehealth (INDEPENDENT_AMBULATORY_CARE_PROVIDER_SITE_OTHER): Payer: BC Managed Care – PPO | Admitting: Family Medicine

## 2020-05-24 DIAGNOSIS — J4530 Mild persistent asthma, uncomplicated: Secondary | ICD-10-CM

## 2020-05-24 DIAGNOSIS — J0100 Acute maxillary sinusitis, unspecified: Secondary | ICD-10-CM | POA: Diagnosis not present

## 2020-05-24 MED ORDER — AZITHROMYCIN 250 MG PO TABS: ORAL_TABLET | ORAL | 0 refills | Status: DC

## 2020-05-24 NOTE — Progress Notes (Signed)
Sx's started x 1 week.   Fighting congestion symptoms. Has sinus infection a few times a year. Rattling in the chest. Meds: Guaifenesin  States Dr. Karie Schwalbe usually calls in a Z-pack and he recovers

## 2020-05-24 NOTE — Assessment & Plan Note (Signed)
He has done well with azithromycin previously and will start this for treatment of his sinus infection.   Recommend increased fluids and may continue guaifenesin.  Instructed to schedule follow up if symptoms worsen or are not resolving.

## 2020-05-24 NOTE — Progress Notes (Signed)
Gabriel Wong - 55 y.o. male MRN [id number]  Date of birth: [birth year]   This visit type was conducted due to national recommendations for restrictions regarding the COVID-19 Pandemic (e.g. social distancing).  This format is felt to be most appropriate for this patient at this time.  All issues noted in this document were discussed and addressed.  No physical exam was performed (except for noted visual exam findings with Video Visits).  I discussed the limitations of evaluation and management by telemedicine and the availability of in person appointments. The patient expressed understanding and agreed to proceed.  I connected with@ on 05/24/20 at  1:40 PM EST by a video enabled telemedicine application and verified that I am speaking with the correct person using two identifiers.  Present at visit: Gabriel Coombe, DO Gabriel Wong   Patient Location: Home [address] [place] [address]   Provider location:   [facility]  No chief complaint on file.   HPI  Gabriel Wong is a 55 y.o. male who presents via audio/video conferencing for a telehealth visit today.  Has complaint of sinus congestion with post nasal drainage and cough.  Symptoms started about 7-8 days ago.  Denies fever, chills, shortness of breath, body aches, headache, nausea, vomiting, diarrhea.  Has this a couple of times per year, usually clears up with azithromycin.     ROS:  A comprehensive ROS was completed and negative except as noted per HPI  Past Medical History:  Diagnosis Date  . Alcoholic hepatitis   . Anxiety   . Asthma   . Gout 04/28/2015  . Hyperlipemia   . Hypertension   . Insomnia   . Rosacea   . Seasonal allergies     Past Surgical History:  Procedure Laterality Date  . BACK SURGERY    . FISSURECTOMY      No family history on file.  Social History   Socioeconomic History  . Marital status: Single    Spouse name: Not on file  . Number of children: Not on file  . Years of  education: Not on file  . Highest education level: Not on file  Occupational History  . Not on file  Tobacco Use  . Smoking status: Current Every Day Smoker    Packs/day: 0.25    Years: 25.00    Pack years: 6.25    Types: Cigarettes, Cigars  . Smokeless tobacco: Never Used  Vaping Use  . Vaping Use: Never used  Substance and Sexual Activity  . Alcohol use: Yes    Comment: daily  . Drug use: Yes    Types: Marijuana  . Sexual activity: Not on file  Other Topics Concern  . Not on file  Social History Narrative  . Not on file   Social Determinants of Health   Financial Resource Strain: Not on file  Food Insecurity: Not on file  Transportation Needs: Not on file  Physical Activity: Not on file  Stress: Not on file  Social Connections: Not on file  Intimate Partner Violence: Not on file     Current Outpatient Medications:  .  ADVAIR DISKUS 250-50 MCG/DOSE AEPB, INHALE ONE PUFF into the lungs TWICE DAILY, Disp: 180 each, Rfl: 3 .  albuterol (VENTOLIN HFA) 108 (90 Base) MCG/ACT inhaler, inhale TWO PUFF into THE lungs EVERY 4 TO 6 HOURS AS NEEDED FOR SHORTNESS OF BREATH OR FOR WHEEZING, Disp: 54 g, Rfl: 3 .  allopurinol (ZYLOPRIM) 300 MG tablet, TAKE ONE TABLET  BY MOUTH EVERY DAY, Disp: 90 tablet, Rfl: 1 .  ALPRAZolam (XANAX) 0.25 MG tablet, Take 1 tablet (0.25 mg total) by mouth 2 (two) times daily as needed., Disp: 180 tablet, Rfl: 0 .  amLODipine (NORVASC) 5 MG tablet, TAKE ONE TABLET BY MOUTH EVERY DAY, Disp: 90 tablet, Rfl: 1 .  Azelaic Acid 15 % cream, After skin is thoroughly washed and patted dry, gently massage a thin film of azelaic acid cream into the affected area 1-2x daily., Disp: 50 g, Rfl: 3 .  azithromycin (ZITHROMAX) 250 MG tablet, 5 day Z-pak dosing, Disp: 6 tablet, Rfl: 0 .  colchicine 0.6 MG tablet, Take 1 tablet (0.6 mg total) by mouth daily as needed., Disp: 90 tablet, Rfl: 1 .  dexamethasone (DECADRON) 4 MG tablet, Take 1 tablet (4 mg total) by mouth 3  (three) times daily., Disp: 15 tablet, Rfl: 0 .  eplerenone (INSPRA) 25 MG tablet, TAKE ONE TABLET BY MOUTH EVERY DAY, Disp: 90 tablet, Rfl: 3 .  ezetimibe (ZETIA) 10 MG tablet, TAKE ONE TABLET BY MOUTH EVERY DAY, Disp: 90 tablet, Rfl: 3 .  fenofibrate 160 MG tablet, TAKE ONE TABLET BY MOUTH EVERY DAY, Disp: 90 tablet, Rfl: 3 .  fluticasone (FLONASE) 50 MCG/ACT nasal spray, Place 1 spray into both nostrils daily., Disp: 48 g, Rfl: 3 .  irbesartan (AVAPRO) 300 MG tablet, TAKE ONE TABLET BY MOUTH EVERY DAY, Disp: 90 tablet, Rfl: 3 .  lansoprazole (PREVACID) 30 MG capsule, TAKE ONE CAPSULE BY MOUTH TWICE DAILY BEFORE MEALS, Disp: 180 capsule, Rfl: 1 .  montelukast (SINGULAIR) 10 MG tablet, TAKE ONE TABLET BY MOUTH AT BEDTIME, Disp: 90 tablet, Rfl: 1 .  VASCEPA 1 g capsule, Take 2 capsules (2 g total) by mouth 2 (two) times daily., Disp: 360 capsule, Rfl: 3  EXAM:  VITALS per patient if applicable: BP 130/78   Wt 198 lb (89.8 kg)   BMI 28.41 kg/m   GENERAL: alert, oriented, appears well and in no acute distress  HEENT: atraumatic, conjunttiva clear, no obvious abnormalities on inspection of external nose and ears  NECK: normal movements of the head and neck  LUNGS: on inspection no signs of respiratory distress, breathing rate appears normal, no obvious gross SOB, gasping or wheezing  CV: no obvious cyanosis  MS: moves all visible extremities without noticeable abnormality  PSYCH/NEURO: pleasant and cooperative, no obvious depression or anxiety, speech and thought processing grossly intact  ASSESSMENT AND PLAN:  Discussed the following assessment and plan:  Acute maxillary sinusitis He has done well with azithromycin previously and will start this for treatment of his sinus infection.   Recommend increased fluids and may continue guaifenesin.  Instructed to schedule follow up if symptoms worsen or are not resolving.       I discussed the assessment and treatment plan with the  patient. The patient was provided an opportunity to ask questions and all were answered. The patient agreed with the plan and demonstrated an understanding of the instructions.   The patient was advised to call back or seek an in-person evaluation if the symptoms worsen or if the condition fails to improve as anticipated.    Gabriel Coombe, DO

## 2020-06-03 ENCOUNTER — Other Ambulatory Visit: Payer: Self-pay | Admitting: Sports Medicine

## 2020-06-03 DIAGNOSIS — F17200 Nicotine dependence, unspecified, uncomplicated: Secondary | ICD-10-CM

## 2020-06-09 ENCOUNTER — Other Ambulatory Visit: Payer: Self-pay

## 2020-06-09 ENCOUNTER — Ambulatory Visit (INDEPENDENT_AMBULATORY_CARE_PROVIDER_SITE_OTHER): Payer: BC Managed Care – PPO

## 2020-06-09 DIAGNOSIS — F17200 Nicotine dependence, unspecified, uncomplicated: Secondary | ICD-10-CM

## 2020-06-09 DIAGNOSIS — F17210 Nicotine dependence, cigarettes, uncomplicated: Secondary | ICD-10-CM | POA: Diagnosis not present

## 2020-07-21 DIAGNOSIS — M542 Cervicalgia: Secondary | ICD-10-CM | POA: Diagnosis not present

## 2020-07-21 DIAGNOSIS — M546 Pain in thoracic spine: Secondary | ICD-10-CM | POA: Diagnosis not present

## 2020-07-21 DIAGNOSIS — M5441 Lumbago with sciatica, right side: Secondary | ICD-10-CM | POA: Diagnosis not present

## 2020-07-28 DIAGNOSIS — M546 Pain in thoracic spine: Secondary | ICD-10-CM | POA: Diagnosis not present

## 2020-07-28 DIAGNOSIS — M542 Cervicalgia: Secondary | ICD-10-CM | POA: Diagnosis not present

## 2020-07-28 DIAGNOSIS — M5441 Lumbago with sciatica, right side: Secondary | ICD-10-CM | POA: Diagnosis not present

## 2020-08-02 DIAGNOSIS — H35713 Central serous chorioretinopathy, bilateral: Secondary | ICD-10-CM | POA: Diagnosis not present

## 2020-08-02 DIAGNOSIS — H35722 Serous detachment of retinal pigment epithelium, left eye: Secondary | ICD-10-CM | POA: Diagnosis not present

## 2020-08-02 DIAGNOSIS — H5319 Other subjective visual disturbances: Secondary | ICD-10-CM | POA: Diagnosis not present

## 2020-08-02 DIAGNOSIS — H35373 Puckering of macula, bilateral: Secondary | ICD-10-CM | POA: Diagnosis not present

## 2020-08-03 ENCOUNTER — Other Ambulatory Visit: Payer: Self-pay | Admitting: Sports Medicine

## 2020-08-04 ENCOUNTER — Other Ambulatory Visit: Payer: Self-pay | Admitting: Sports Medicine

## 2020-08-04 DIAGNOSIS — E781 Pure hyperglyceridemia: Secondary | ICD-10-CM

## 2020-08-05 ENCOUNTER — Telehealth: Payer: Self-pay

## 2020-08-05 NOTE — Telephone Encounter (Signed)
Pt called and stated he recently started [employer] 150mg  and was told that it was recommended to have a pneumonia vaccine. Pt states you told him he was "good" on the pneumonia vaccine. However there is no documentation regarding a pneumonia vaccine in his chart. Pt wants to know if he needs to get one. Please advise, thanks.

## 2020-08-05 NOTE — Telephone Encounter (Signed)
He can go ahead and get the Prevnar 20 now that he is immunocompromised with [employer].

## 2020-08-08 NOTE — Telephone Encounter (Signed)
Patient advised and scheduled.  

## 2020-08-09 ENCOUNTER — Other Ambulatory Visit: Payer: Self-pay

## 2020-08-09 ENCOUNTER — Ambulatory Visit (INDEPENDENT_AMBULATORY_CARE_PROVIDER_SITE_OTHER): Payer: BC Managed Care – PPO | Admitting: Sports Medicine

## 2020-08-09 VITALS — BP 135/86 | HR 70 | Temp 98.1°F | Resp 20 | Ht 70.0 in | Wt 195.0 lb

## 2020-08-09 DIAGNOSIS — Z23 Encounter for immunization: Secondary | ICD-10-CM | POA: Diagnosis not present

## 2020-08-09 NOTE — Progress Notes (Signed)
Established Patient Office Visit  Subjective:  Patient ID: Gabriel Wong, male    DOB: 03/19/1966  Age: 55 y.o. MRN: [id number]  CC:  Chief Complaint  Patient presents with  . Immunizations    HPI Gabriel Wong presents for a Pneumonia vaccine. Prevnar 20 vaccine given in right deltoid, pt tolerated well without apparent complications.   Past Medical History:  Diagnosis Date  . Alcoholic hepatitis   . Anxiety   . Asthma   . Gout 04/28/2015  . Hyperlipemia   . Hypertension   . Insomnia   . Rosacea   . Seasonal allergies     Past Surgical History:  Procedure Laterality Date  . BACK SURGERY    . FISSURECTOMY      History reviewed. No pertinent family history.  Social History   Socioeconomic History  . Marital status: Single    Spouse name: Not on file  . Number of children: Not on file  . Years of education: Not on file  . Highest education level: Not on file  Occupational History  . Not on file  Tobacco Use  . Smoking status: Current Every Day Smoker    Packs/day: 0.25    Years: 25.00    Pack years: 6.25    Types: Cigarettes, Cigars  . Smokeless tobacco: Never Used  Vaping Use  . Vaping Use: Never used  Substance and Sexual Activity  . Alcohol use: Yes    Comment: daily  . Drug use: Yes    Types: Marijuana  . Sexual activity: Not on file  Other Topics Concern  . Not on file  Social History Narrative  . Not on file   Social Determinants of Health   Financial Resource Strain: Not on file  Food Insecurity: Not on file  Transportation Needs: Not on file  Physical Activity: Not on file  Stress: Not on file  Social Connections: Not on file  Intimate Partner Violence: Not on file    Outpatient Medications Prior to Visit  Medication Sig Dispense Refill  . ADVAIR DISKUS 250-50 MCG/DOSE AEPB INHALE ONE PUFF into the lungs TWICE DAILY 180 each 3  . albuterol (VENTOLIN HFA) 108 (90 Base) MCG/ACT inhaler inhale TWO PUFF into THE lungs EVERY 4 TO  6 HOURS AS NEEDED FOR SHORTNESS OF BREATH OR FOR WHEEZING 54 g 3  . allopurinol (ZYLOPRIM) 300 MG tablet TAKE ONE TABLET BY MOUTH DAILY 90 tablet 1  . ALPRAZolam (XANAX) 0.25 MG tablet Take 1 tablet (0.25 mg total) by mouth 2 (two) times daily as needed. 180 tablet 0  . amLODipine (NORVASC) 5 MG tablet TAKE ONE TABLET BY MOUTH EVERY DAY 90 tablet 1  . Azelaic Acid 15 % cream After skin is thoroughly washed and patted dry, gently massage a thin film of azelaic acid cream into the affected area 1-2x daily. 50 g 3  . azithromycin (ZITHROMAX) 250 MG tablet 5 day Z-pak dosing 6 tablet 0  . colchicine 0.6 MG tablet Take 1 tablet (0.6 mg total) by mouth daily as needed. 90 tablet 1  . dexamethasone (DECADRON) 4 MG tablet Take 1 tablet (4 mg total) by mouth 3 (three) times daily. 15 tablet 0  . eplerenone (INSPRA) 25 MG tablet TAKE ONE TABLET BY MOUTH EVERY DAY 90 tablet 3  . ezetimibe (ZETIA) 10 MG tablet TAKE ONE TABLET BY MOUTH EVERY DAY 90 tablet 3  . fenofibrate 160 MG tablet TAKE ONE TABLET BY MOUTH EVERY DAY 90 tablet 3  .  fluticasone (FLONASE) 50 MCG/ACT nasal spray Place 1 spray into both nostrils daily. 48 g 3  . irbesartan (AVAPRO) 300 MG tablet TAKE ONE TABLET BY MOUTH EVERY DAY 90 tablet 3  . lansoprazole (PREVACID) 30 MG capsule TAKE ONE CAPSULE BY MOUTH TWICE DAILY BEFORE MEALS 180 capsule 1  . montelukast (SINGULAIR) 10 MG tablet TAKE ONE TABLET BY MOUTH AT BEDTIME 90 tablet 1  . SKYRIZI PEN 150 MG/ML SOAJ Inject into the skin.    Marland Kitchen VASCEPA 1 g capsule Take 2 capsules (2 g total) by mouth 2 (two) times daily. 360 capsule 3   No facility-administered medications prior to visit.    Allergies  Allergen Reactions  . Lipitor [Atorvastatin] Other (See Comments)    Elevated LFTs  . Tylenol [Acetaminophen]     ROS Review of Systems    Objective:    Physical Exam  BP 135/86 (BP Location: Left Arm, Patient Position: Sitting, Cuff Size: Large)   Pulse 70   Temp 98.1 F (36.7 C)  (Oral)   Resp 20   Ht 5\' 10"  (1.778 m)   Wt 195 lb (88.5 kg)   SpO2 98%   BMI 27.98 kg/m  Wt Readings from Last 3 Encounters:  08/09/20 195 lb (88.5 kg)  05/24/20 198 lb (89.8 kg)  03/16/20 195 lb (88.5 kg)     Health Maintenance Due  Topic Date Due  . COVID-19 Vaccine (1) Never done    There are no preventive care reminders to display for this patient.  Lab Results  Component Value Date   TSH 1.13 09/28/2019   Lab Results  Component Value Date   WBC 5.7 09/28/2019   HGB 16.2 09/28/2019   HCT 45.4 09/28/2019   MCV 96.6 09/28/2019   PLT 194 09/28/2019   Lab Results  Component Value Date   NA 142 09/28/2019   K 3.3 (L) 09/28/2019   CO2 29 09/28/2019   GLUCOSE 111 (H) 09/28/2019   BUN 19 09/28/2019   CREATININE 0.93 09/28/2019   BILITOT 1.1 09/28/2019   ALKPHOS 58 07/31/2016   AST 101 (H) 09/28/2019   ALT 129 (H) 09/28/2019   PROT 7.3 09/28/2019   ALBUMIN 4.7 07/31/2016   CALCIUM 10.5 (H) 09/28/2019   Lab Results  Component Value Date   CHOL 231 (H) 09/28/2019   Lab Results  Component Value Date   HDL 46 09/28/2019   Lab Results  Component Value Date   LDLCALC 151 (H) 09/28/2019   Lab Results  Component Value Date   TRIG 195 (H) 09/28/2019   Lab Results  Component Value Date   CHOLHDL 5.0 (H) 09/28/2019   Lab Results  Component Value Date   HGBA1C 5.0 09/28/2019      Assessment & Plan:  Prevnar 20 vaccine given in right deltoid, pt tolerated well without apparent complications.  Problem List Items Addressed This Visit   None   Visit Diagnoses    Need for vaccination against Streptococcus pneumoniae    -  Primary   Relevant Orders   Pneumococcal conjugate vaccine 20-valent (Prevnar-20) (Completed)      No orders of the defined types were placed in this encounter.   Follow-up: No follow-ups on file.    09/30/2019, CMA

## 2020-08-11 DIAGNOSIS — M546 Pain in thoracic spine: Secondary | ICD-10-CM | POA: Diagnosis not present

## 2020-08-11 DIAGNOSIS — M542 Cervicalgia: Secondary | ICD-10-CM | POA: Diagnosis not present

## 2020-08-11 DIAGNOSIS — M5441 Lumbago with sciatica, right side: Secondary | ICD-10-CM | POA: Diagnosis not present

## 2020-08-16 DIAGNOSIS — H35373 Puckering of macula, bilateral: Secondary | ICD-10-CM | POA: Diagnosis not present

## 2020-08-16 DIAGNOSIS — H35713 Central serous chorioretinopathy, bilateral: Secondary | ICD-10-CM | POA: Diagnosis not present

## 2020-08-16 DIAGNOSIS — H353221 Exudative age-related macular degeneration, left eye, with active choroidal neovascularization: Secondary | ICD-10-CM | POA: Diagnosis not present

## 2020-08-16 DIAGNOSIS — H35052 Retinal neovascularization, unspecified, left eye: Secondary | ICD-10-CM | POA: Diagnosis not present

## 2020-08-25 DIAGNOSIS — M5441 Lumbago with sciatica, right side: Secondary | ICD-10-CM | POA: Diagnosis not present

## 2020-08-25 DIAGNOSIS — M542 Cervicalgia: Secondary | ICD-10-CM | POA: Diagnosis not present

## 2020-08-25 DIAGNOSIS — M546 Pain in thoracic spine: Secondary | ICD-10-CM | POA: Diagnosis not present

## 2020-08-29 DIAGNOSIS — H35052 Retinal neovascularization, unspecified, left eye: Secondary | ICD-10-CM | POA: Diagnosis not present

## 2020-09-01 DIAGNOSIS — M5441 Lumbago with sciatica, right side: Secondary | ICD-10-CM | POA: Diagnosis not present

## 2020-09-01 DIAGNOSIS — M546 Pain in thoracic spine: Secondary | ICD-10-CM | POA: Diagnosis not present

## 2020-09-01 DIAGNOSIS — M542 Cervicalgia: Secondary | ICD-10-CM | POA: Diagnosis not present

## 2020-09-09 ENCOUNTER — Telehealth: Payer: Self-pay

## 2020-09-09 NOTE — Telephone Encounter (Signed)
Pt called and stated he was having some pain with is R knee. Made appt with Dr. Karie Schwalbe for 09/13/20.

## 2020-09-13 ENCOUNTER — Ambulatory Visit (INDEPENDENT_AMBULATORY_CARE_PROVIDER_SITE_OTHER): Payer: BC Managed Care – PPO

## 2020-09-13 ENCOUNTER — Ambulatory Visit (INDEPENDENT_AMBULATORY_CARE_PROVIDER_SITE_OTHER): Payer: BC Managed Care – PPO | Admitting: Sports Medicine

## 2020-09-13 ENCOUNTER — Other Ambulatory Visit: Payer: Self-pay

## 2020-09-13 DIAGNOSIS — M1A9XX0 Chronic gout, unspecified, without tophus (tophi): Secondary | ICD-10-CM

## 2020-09-13 DIAGNOSIS — N139 Obstructive and reflux uropathy, unspecified: Secondary | ICD-10-CM

## 2020-09-13 DIAGNOSIS — Z0000 Encounter for general adult medical examination without abnormal findings: Secondary | ICD-10-CM | POA: Diagnosis not present

## 2020-09-13 DIAGNOSIS — M170 Bilateral primary osteoarthritis of knee: Secondary | ICD-10-CM

## 2020-09-13 DIAGNOSIS — Z23 Encounter for immunization: Secondary | ICD-10-CM | POA: Diagnosis not present

## 2020-09-13 DIAGNOSIS — E782 Mixed hyperlipidemia: Secondary | ICD-10-CM

## 2020-09-13 DIAGNOSIS — Z113 Encounter for screening for infections with a predominantly sexual mode of transmission: Secondary | ICD-10-CM

## 2020-09-13 NOTE — Addendum Note (Signed)
Addended by: Monica Becton on: 09/13/2020 11:32 AM   Modules accepted: Orders

## 2020-09-13 NOTE — Assessment & Plan Note (Addendum)
Shingrix today, repeat shot #2 at his annual physical. I have ordered his routine labs.

## 2020-09-13 NOTE — Progress Notes (Addendum)
    Procedures performed today:    Procedure: Real-time Ultrasound Guided aspiration/injection of right knee Device: Samsung [id number]  Verbal informed consent obtained.  Time-out conducted.  Noted no overlying erythema, induration, or other signs of local infection.  Skin prepped in a sterile fashion.  Local anesthesia: Topical Ethyl chloride.  With sterile technique and under real time ultrasound guidance:  Using an 18-gauge needle aspirated approximately 10 mL of clear, straw-colored fluid, syringe switched and 1 cc Kenalog 40, 2 cc lidocaine, 2 cc bupivacaine injected easily Completed without difficulty  Advised to call if fevers/chills, erythema, induration, drainage, or persistent bleeding.  Images permanently stored and available for review in PACS.  Impression: Technically successful ultrasound guided injection.  Independent interpretation of notes and tests performed by another provider:   None.  Brief History, Exam, Impression, and Recommendations:    Primary osteoarthritis of both knees Recent fall, right knee effusion, aspiration and injection today due to exacerbation of a chronic disease process. Last injection was in 2020. Return to see me in 1 month as needed.  Annual physical exam Shingrix today, repeat shot #2 at his annual physical. I have ordered his routine labs.    ___________________________________________ [name]. Benjamin Stain, M.D., ABFM., CAQSM. Primary Care and Sports Medicine [facility]  Adjunct Instructor of Family Medicine  [employer]

## 2020-09-13 NOTE — Addendum Note (Signed)
Addended by: Carolin Coy on: 09/13/2020 10:59 AM   Modules accepted: Orders

## 2020-09-13 NOTE — Assessment & Plan Note (Addendum)
Recent fall, right knee effusion, aspiration and injection today due to exacerbation of a chronic disease process. Last injection was in 2020. Return to see me in 1 month as needed.

## 2020-09-29 DIAGNOSIS — M546 Pain in thoracic spine: Secondary | ICD-10-CM | POA: Diagnosis not present

## 2020-09-29 DIAGNOSIS — M5441 Lumbago with sciatica, right side: Secondary | ICD-10-CM | POA: Diagnosis not present

## 2020-09-29 DIAGNOSIS — M542 Cervicalgia: Secondary | ICD-10-CM | POA: Diagnosis not present

## 2020-10-06 ENCOUNTER — Encounter: Payer: BC Managed Care – PPO | Admitting: Sports Medicine

## 2020-10-20 DIAGNOSIS — M546 Pain in thoracic spine: Secondary | ICD-10-CM | POA: Diagnosis not present

## 2020-10-20 DIAGNOSIS — M542 Cervicalgia: Secondary | ICD-10-CM | POA: Diagnosis not present

## 2020-10-20 DIAGNOSIS — M5441 Lumbago with sciatica, right side: Secondary | ICD-10-CM | POA: Diagnosis not present

## 2020-10-28 ENCOUNTER — Other Ambulatory Visit: Payer: Self-pay | Admitting: Sports Medicine

## 2020-11-10 ENCOUNTER — Encounter: Payer: Self-pay | Admitting: Sports Medicine

## 2020-11-10 ENCOUNTER — Ambulatory Visit (INDEPENDENT_AMBULATORY_CARE_PROVIDER_SITE_OTHER): Payer: BC Managed Care – PPO | Admitting: Sports Medicine

## 2020-11-10 VITALS — BP 116/74 | HR 67 | Ht 70.0 in | Wt 197.0 lb

## 2020-11-10 DIAGNOSIS — Z113 Encounter for screening for infections with a predominantly sexual mode of transmission: Secondary | ICD-10-CM | POA: Diagnosis not present

## 2020-11-10 DIAGNOSIS — L821 Other seborrheic keratosis: Secondary | ICD-10-CM

## 2020-11-10 DIAGNOSIS — Z0000 Encounter for general adult medical examination without abnormal findings: Secondary | ICD-10-CM | POA: Diagnosis not present

## 2020-11-10 DIAGNOSIS — E782 Mixed hyperlipidemia: Secondary | ICD-10-CM | POA: Diagnosis not present

## 2020-11-10 DIAGNOSIS — M1A9XX0 Chronic gout, unspecified, without tophus (tophi): Secondary | ICD-10-CM | POA: Diagnosis not present

## 2020-11-10 DIAGNOSIS — Z23 Encounter for immunization: Secondary | ICD-10-CM | POA: Diagnosis not present

## 2020-11-10 DIAGNOSIS — N139 Obstructive and reflux uropathy, unspecified: Secondary | ICD-10-CM | POA: Diagnosis not present

## 2020-11-10 NOTE — Progress Notes (Signed)
Subjective:    CC: Annual Physical Exam  HPI:  This patient is here for their annual physical  I reviewed the past medical history, family history, social history, surgical history, and allergies today and no changes were needed.  Please see the problem list section below in epic for further details.  Past Medical History: Past Medical History:  Diagnosis Date   Alcoholic hepatitis    Anxiety    Asthma    Gout 04/28/2015   Hyperlipemia    Hypertension    Insomnia    Rosacea    Seasonal allergies    Past Surgical History: Past Surgical History:  Procedure Laterality Date   BACK SURGERY     FISSURECTOMY     Social History: Social History   Socioeconomic History   Marital status: Single    Spouse name: Not on file   Number of children: Not on file   Years of education: Not on file   Highest education level: Not on file  Occupational History   Not on file  Tobacco Use   Smoking status: Every Day    Packs/day: 0.25    Years: 25.00    Pack years: 6.25    Types: Cigarettes, Cigars   Smokeless tobacco: Never  Vaping Use   Vaping Use: Never used  Substance and Sexual Activity   Alcohol use: Yes    Comment: daily   Drug use: Yes    Types: Marijuana   Sexual activity: Not on file  Other Topics Concern   Not on file  Social History Narrative   Not on file   Social Determinants of Health   Financial Resource Strain: Not on file  Food Insecurity: Not on file  Transportation Needs: Not on file  Physical Activity: Not on file  Stress: Not on file  Social Connections: Not on file   Family History: No family history on file. Allergies: Allergies  Allergen Reactions   Lipitor [Atorvastatin] Other (See Comments)    Elevated LFTs   Tylenol [Acetaminophen]    Medications: See med rec.  Review of Systems: No headache, visual changes, nausea, vomiting, diarrhea, constipation, dizziness, abdominal pain, skin rash, fevers, chills, night sweats, swollen lymph  nodes, weight loss, chest pain, body aches, joint swelling, muscle aches, shortness of breath, mood changes, visual or auditory hallucinations.  Objective:    General: Well Developed, well nourished, and in no acute distress.  Neuro: Alert and oriented x3, extra-ocular muscles intact, sensation grossly intact. Cranial nerves II through XII are intact, motor, sensory, and coordinative functions are all intact. HEENT: Normocephalic, atraumatic, pupils equal round reactive to light, neck supple, no masses, no lymphadenopathy, thyroid nonpalpable. Oropharynx, nasopharynx, external ear canals are unremarkable. Skin: Warm and dry, no rashes noted.  Few SKs noted. Cardiac: Regular rate and rhythm, no murmurs rubs or gallops.  Respiratory: Clear to auscultation bilaterally. Not using accessory muscles, speaking in full sentences.  Abdominal: Soft, nontender, nondistended, positive bowel sounds, no masses, no organomegaly.  Musculoskeletal: Shoulder, elbow, wrist, hip, knee, ankle stable, and with full range of motion.  Procedure:  Cryodestruction of seborrheic keratoses on right arm, 3 on the back. Consent obtained and verified. Time-out conducted. Noted no overlying erythema, induration, or other signs of local infection. Completed without difficulty using Cryo-Gun. Advised to call if fevers/chills, erythema, induration, drainage, or persistent bleeding.  Impression and Recommendations:    The patient was counselled, risk factors were discussed, anticipatory guidance given.  Annual physical exam Gabriel Wong returns for his physical,  he is a bit early but we gave him Shingrix #2 today, he is getting his routine labs. He has cut back significantly on his alcohol intake and feels significantly better. Up-to-date on everything else.  Seborrheic keratoses We performed cryotherapy on SKs on his right upper arm, and 3 on his back.   ___________________________________________ Gabriel Wong. Gabriel Wong,  M.D., ABFM., CAQSM. Primary Care and Sports Medicine [facility]  Adjunct Professor of Family Medicine  [employer]

## 2020-11-10 NOTE — Assessment & Plan Note (Addendum)
Gabriel Wong returns for his physical, he is a bit early but we gave him Shingrix #2 today, he is getting his routine labs. He has cut back significantly on his alcohol intake and feels significantly better. Up-to-date on everything else.

## 2020-11-10 NOTE — Assessment & Plan Note (Signed)
We performed cryotherapy on SKs on his right upper arm, and 3 on his back.

## 2020-11-11 LAB — CBC
HCT: 46.3 % (ref 38.5–50.0)
Hemoglobin: 16.4 g/dL (ref 13.2–17.1)
MCH: 34.6 pg — ABNORMAL HIGH (ref 27.0–33.0)
MCHC: 35.4 g/dL (ref 32.0–36.0)
MCV: 97.7 fL (ref 80.0–100.0)
MPV: 11.2 fL (ref 7.5–12.5)
Platelets: 254 Thousand/uL (ref 140–400)
RBC: 4.74 Million/uL (ref 4.20–5.80)
RDW: 12.4 % (ref 11.0–15.0)
WBC: 5.1 Thousand/uL (ref 3.8–10.8)

## 2020-11-11 LAB — COMPREHENSIVE METABOLIC PANEL
AG Ratio: 1.9 (calc) (ref 1.0–2.5)
ALT: 105 U/L — ABNORMAL HIGH (ref 9–46)
AST: 89 U/L — ABNORMAL HIGH (ref 10–35)
Albumin: 4.7 g/dL (ref 3.6–5.1)
Alkaline phosphatase (APISO): 48 U/L (ref 35–144)
BUN: 18 mg/dL (ref 7–25)
CO2: 28 mmol/L (ref 20–32)
Calcium: 10.1 mg/dL (ref 8.6–10.3)
Chloride: 106 mmol/L (ref 98–110)
Creat: 0.85 mg/dL (ref 0.70–1.30)
Globulin: 2.5 g/dL (calc) (ref 1.9–3.7)
Glucose, Bld: 93 mg/dL (ref 65–99)
Potassium: 3.8 mmol/L (ref 3.5–5.3)
Sodium: 145 mmol/L (ref 135–146)
Total Bilirubin: 1 mg/dL (ref 0.2–1.2)
Total Protein: 7.2 g/dL (ref 6.1–8.1)

## 2020-11-11 LAB — LIPID PANEL
Cholesterol: 200 mg/dL — ABNORMAL HIGH (ref <200)
HDL: 39 mg/dL — ABNORMAL LOW (ref >=40)
LDL Cholesterol (Calc): 138 mg/dL (calc) — ABNORMAL HIGH
Non-HDL Cholesterol (Calc): 161 mg/dL (calc) — ABNORMAL HIGH (ref <130)
Total CHOL/HDL Ratio: 5.1 (calc) — ABNORMAL HIGH (ref <5.0)
Triglycerides: 112 mg/dL (ref <150)

## 2020-11-11 LAB — C. TRACHOMATIS/N. GONORRHOEAE RNA
C. trachomatis RNA, TMA: NOT DETECTED
N. gonorrhoeae RNA, TMA: NOT DETECTED

## 2020-11-11 LAB — PSA, TOTAL AND FREE
PSA, % Free: 25 % (calc) — ABNORMAL LOW (ref >25)
PSA, Free: 0.3 ng/mL
PSA, Total: 1.2 ng/mL (ref <=4.0)

## 2020-11-11 LAB — HIV ANTIBODY (ROUTINE TESTING W REFLEX): HIV 1&2 Ab, 4th Generation: NONREACTIVE

## 2020-11-11 LAB — HSV 2 ANTIBODY, IGG: HSV 2 Glycoprotein G Ab, IgG: <0.9 index

## 2020-11-11 LAB — RPR: RPR Ser Ql: NONREACTIVE

## 2020-11-11 LAB — TSH: TSH: 1.89 mIU/L (ref 0.40–4.50)

## 2020-11-11 LAB — URIC ACID: Uric Acid, Serum: 4.8 mg/dL (ref 4.0–8.0)

## 2020-11-17 DIAGNOSIS — M542 Cervicalgia: Secondary | ICD-10-CM | POA: Diagnosis not present

## 2020-11-17 DIAGNOSIS — M5441 Lumbago with sciatica, right side: Secondary | ICD-10-CM | POA: Diagnosis not present

## 2020-11-17 DIAGNOSIS — M546 Pain in thoracic spine: Secondary | ICD-10-CM | POA: Diagnosis not present

## 2020-12-08 DIAGNOSIS — M542 Cervicalgia: Secondary | ICD-10-CM | POA: Diagnosis not present

## 2020-12-08 DIAGNOSIS — M5441 Lumbago with sciatica, right side: Secondary | ICD-10-CM | POA: Diagnosis not present

## 2020-12-08 DIAGNOSIS — M546 Pain in thoracic spine: Secondary | ICD-10-CM | POA: Diagnosis not present

## 2020-12-13 DIAGNOSIS — D485 Neoplasm of uncertain behavior of skin: Secondary | ICD-10-CM | POA: Diagnosis not present

## 2020-12-13 DIAGNOSIS — L719 Rosacea, unspecified: Secondary | ICD-10-CM | POA: Diagnosis not present

## 2020-12-13 DIAGNOSIS — D239 Other benign neoplasm of skin, unspecified: Secondary | ICD-10-CM | POA: Diagnosis not present

## 2020-12-13 DIAGNOSIS — L820 Inflamed seborrheic keratosis: Secondary | ICD-10-CM | POA: Diagnosis not present

## 2020-12-13 DIAGNOSIS — L814 Other melanin hyperpigmentation: Secondary | ICD-10-CM | POA: Diagnosis not present

## 2020-12-20 ENCOUNTER — Telehealth: Payer: Self-pay

## 2020-12-20 NOTE — Telephone Encounter (Signed)
Gabriel Wong left a message stating he had a reaction to colchicine. I called and left a message for a return call.

## 2021-01-16 ENCOUNTER — Telehealth: Payer: Self-pay

## 2021-01-16 NOTE — Telephone Encounter (Signed)
Patient called and is considering [facility] screening to check for cardiovascular issues, plaque buildup etc. He would like to know if you think he should do this.

## 2021-01-16 NOTE — Telephone Encounter (Signed)
Called and advised patient.   He is now requesting a z-pak to helps with sinus issues/congestion.

## 2021-01-16 NOTE — Telephone Encounter (Signed)
Cannot hurt but it may not change what we do in terms of management.

## 2021-01-17 NOTE — Telephone Encounter (Signed)
He should just find another practice if he is going to throw a tantrum, I have plenty of money and don't need his.  He will however be begging to come back to me in weeks once he realizes the grass isn't always greener.

## 2021-01-17 NOTE — Telephone Encounter (Signed)
This would need at least a virtual, probably will need to be with one of my partners.

## 2021-01-17 NOTE — Telephone Encounter (Signed)
Pt upset he needs an appt, he said this all about money, then said T would work him in. I told the patient no and that T won't even be here so it's with another provider and has nothing to do with money, it has to do with having technically new acute issues (even if he has had these in the past, it's new now). Pt finally agreered to schedule a virtual tomorrow with [employer]

## 2021-01-18 ENCOUNTER — Encounter: Payer: Self-pay | Admitting: Physician Assistant

## 2021-01-18 ENCOUNTER — Telehealth (INDEPENDENT_AMBULATORY_CARE_PROVIDER_SITE_OTHER): Payer: BC Managed Care – PPO | Admitting: Physician Assistant

## 2021-01-18 VITALS — BP 120/72 | Temp 98.6°F | Ht 70.0 in | Wt 197.0 lb

## 2021-01-18 DIAGNOSIS — J40 Bronchitis, not specified as acute or chronic: Secondary | ICD-10-CM | POA: Diagnosis not present

## 2021-01-18 DIAGNOSIS — J302 Other seasonal allergic rhinitis: Secondary | ICD-10-CM | POA: Diagnosis not present

## 2021-01-18 DIAGNOSIS — J329 Chronic sinusitis, unspecified: Secondary | ICD-10-CM

## 2021-01-18 DIAGNOSIS — J4530 Mild persistent asthma, uncomplicated: Secondary | ICD-10-CM

## 2021-01-18 MED ORDER — AZITHROMYCIN 250 MG PO TABS: ORAL_TABLET | ORAL | 0 refills | Status: DC
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
## 2021-01-18 NOTE — Progress Notes (Signed)
Allergies - ragweed Has asthma Headache Sinus pressure Chest congestion  Taking Zyrtec and Guaifenesin

## 2021-01-23 ENCOUNTER — Other Ambulatory Visit: Payer: Self-pay | Admitting: Sports Medicine

## 2021-01-23 DIAGNOSIS — J4530 Mild persistent asthma, uncomplicated: Secondary | ICD-10-CM

## 2021-01-24 ENCOUNTER — Other Ambulatory Visit: Payer: Self-pay | Admitting: Sports Medicine

## 2021-02-02 ENCOUNTER — Telehealth: Payer: Self-pay

## 2021-02-02 NOTE — Telephone Encounter (Signed)
Medication: VASCEPA 1 g capsule Prior authorization submitted via CoverMyMeds on 02/02/2021 PA submission pending

## 2021-02-09 DIAGNOSIS — M5441 Lumbago with sciatica, right side: Secondary | ICD-10-CM | POA: Diagnosis not present

## 2021-02-09 DIAGNOSIS — M542 Cervicalgia: Secondary | ICD-10-CM | POA: Diagnosis not present

## 2021-02-09 DIAGNOSIS — M546 Pain in thoracic spine: Secondary | ICD-10-CM | POA: Diagnosis not present

## 2021-02-11 ENCOUNTER — Other Ambulatory Visit: Payer: Self-pay | Admitting: Sports Medicine

## 2021-04-18 ENCOUNTER — Other Ambulatory Visit: Payer: Self-pay | Admitting: Sports Medicine

## 2021-04-18 DIAGNOSIS — Z79899 Other long term (current) drug therapy: Secondary | ICD-10-CM | POA: Diagnosis not present

## 2021-05-15 NOTE — Telephone Encounter (Signed)
Medication: VASCEPA 1 g capsule Prior authorization determination received ON 02/04/2021 Medication has been approved Approval dates: 02/02/2021-02/01/2022

## 2021-05-16 DIAGNOSIS — H30022 Focal chorioretinal inflammation of posterior pole, left eye: Secondary | ICD-10-CM | POA: Diagnosis not present

## 2021-05-16 DIAGNOSIS — H35373 Puckering of macula, bilateral: Secondary | ICD-10-CM | POA: Diagnosis not present

## 2021-05-16 DIAGNOSIS — H35363 Drusen (degenerative) of macula, bilateral: Secondary | ICD-10-CM | POA: Diagnosis not present

## 2021-05-16 DIAGNOSIS — H35052 Retinal neovascularization, unspecified, left eye: Secondary | ICD-10-CM | POA: Diagnosis not present

## 2021-05-16 DIAGNOSIS — H35713 Central serous chorioretinopathy, bilateral: Secondary | ICD-10-CM | POA: Diagnosis not present

## 2021-07-03 ENCOUNTER — Other Ambulatory Visit: Payer: Self-pay | Admitting: Sports Medicine

## 2021-07-03 DIAGNOSIS — E781 Pure hyperglyceridemia: Secondary | ICD-10-CM

## 2021-07-06 DIAGNOSIS — M5441 Lumbago with sciatica, right side: Secondary | ICD-10-CM | POA: Diagnosis not present

## 2021-07-06 DIAGNOSIS — M546 Pain in thoracic spine: Secondary | ICD-10-CM | POA: Diagnosis not present

## 2021-07-06 DIAGNOSIS — M542 Cervicalgia: Secondary | ICD-10-CM | POA: Diagnosis not present

## 2021-07-13 ENCOUNTER — Telehealth: Payer: Self-pay | Admitting: Sports Medicine

## 2021-07-13 DIAGNOSIS — M5441 Lumbago with sciatica, right side: Secondary | ICD-10-CM | POA: Diagnosis not present

## 2021-07-13 DIAGNOSIS — M51369 Other intervertebral disc degeneration, lumbar region without mention of lumbar back pain or lower extremity pain: Secondary | ICD-10-CM

## 2021-07-13 DIAGNOSIS — M5136 Other intervertebral disc degeneration, lumbar region: Secondary | ICD-10-CM

## 2021-07-13 DIAGNOSIS — M546 Pain in thoracic spine: Secondary | ICD-10-CM | POA: Diagnosis not present

## 2021-07-13 DIAGNOSIS — M542 Cervicalgia: Secondary | ICD-10-CM | POA: Diagnosis not present

## 2021-07-13 NOTE — Telephone Encounter (Signed)
Patient called and wanted to get in to see you, but your next appointment is a few weeks out. Patient stated he only needs an order put in, for [facility] for Lower Back Injection. He stated he has had this done before. AM ?

## 2021-07-14 ENCOUNTER — Ambulatory Visit
Admission: RE | Admit: 2021-07-14 | Discharge: 2021-07-14 | Disposition: A | Discharge status: Home / Self Care | Payer: BC Managed Care – PPO | Source: Ambulatory Visit | Attending: Sports Medicine | Admitting: Sports Medicine

## 2021-07-14 DIAGNOSIS — M47817 Spondylosis without myelopathy or radiculopathy, lumbosacral region: Secondary | ICD-10-CM | POA: Diagnosis not present

## 2021-07-14 DIAGNOSIS — M5136 Other intervertebral disc degeneration, lumbar region: Secondary | ICD-10-CM

## 2021-07-14 MED ORDER — METHYLPREDNISOLONE ACETATE 40 MG/ML INJ SUSP (RADIOLOG
80.0000 mg | Freq: Once | INTRAMUSCULAR | Status: AC
  Administered 2021-07-14: 80 mg via EPIDURAL

## 2021-07-14 MED ORDER — IOPAMIDOL (ISOVUE-M 200) INJECTION 41%
1.0000 mL | Freq: Once | INTRAMUSCULAR | Status: AC
  Administered 2021-07-14: 1 mL via EPIDURAL

## 2021-07-14 NOTE — Telephone Encounter (Signed)
Absolutely, no need for an appointment for that, orders placed. ?

## 2021-07-14 NOTE — Telephone Encounter (Signed)
LVM that orders have been placed.

## 2021-07-14 NOTE — Discharge Instructions (Signed)

## 2021-07-24 ENCOUNTER — Encounter: Payer: Self-pay | Admitting: Family Medicine

## 2021-07-24 ENCOUNTER — Other Ambulatory Visit: Payer: Self-pay | Admitting: Sports Medicine

## 2021-07-24 ENCOUNTER — Ambulatory Visit (INDEPENDENT_AMBULATORY_CARE_PROVIDER_SITE_OTHER): Payer: BC Managed Care – PPO | Admitting: Family Medicine

## 2021-07-24 VITALS — BP 139/80 | HR 69 | Resp 18 | Ht 70.0 in | Wt 200.0 lb

## 2021-07-24 DIAGNOSIS — S70362A Insect bite (nonvenomous), left thigh, initial encounter: Secondary | ICD-10-CM | POA: Diagnosis not present

## 2021-07-24 DIAGNOSIS — W57XXXA Bitten or stung by nonvenomous insect and other nonvenomous arthropods, initial encounter: Secondary | ICD-10-CM | POA: Diagnosis not present

## 2021-07-24 DIAGNOSIS — J4521 Mild intermittent asthma with (acute) exacerbation: Secondary | ICD-10-CM | POA: Diagnosis not present

## 2021-07-24 MED ORDER — DOXYCYCLINE HYCLATE 100 MG PO TABS: 200.0000 mg | ORAL_TABLET | Freq: Once | ORAL | 0 refills | Status: AC

## 2021-07-24 MED ORDER — AZITHROMYCIN 250 MG PO TABS: ORAL_TABLET | ORAL | 0 refills | Status: DC

## 2021-07-24 NOTE — Progress Notes (Signed)
? ?Acute Office Visit ? ?Subjective:  ? ? Patient ID: Gabriel Wong, male    DOB: December 01, 1965, 56 y.o.   MRN: [id number] ? ?Chief Complaint  ?Patient presents with  ? Tick Removal  ?  Patient states he has a deer tick on left thigh.   ? ? ?HPI ?Patient is in today for tick that he noticed on his left thigh this morning he thinks he probably got it from his dog.  He said he had difficulty getting it out and is pretty sure that the head is still stuck in the skin he noticed some erythema and redness around the bite area but no distal rash.  No fevers or chills.  He is concerned about prevention for Lyme's disease. ? ?He also reports that with the increase in spring allergies that its been flaring his asthma.  He has been getting burning in his chest along with increased cough and mucus production though it is white-colored.  He has not had any significant wheezing.  No significant sinus symptoms.  He says he does not tolerate prednisone. ? ?Past Medical History:  ?Diagnosis Date  ? Alcoholic hepatitis   ? Anxiety   ? Asthma   ? Gout 04/28/2015  ? Hyperlipemia   ? Hypertension   ? Insomnia   ? Rosacea   ? Seasonal allergies   ? ? ?Past Surgical History:  ?Procedure Laterality Date  ? BACK SURGERY    ? FISSURECTOMY    ? ? ?No family history on file. ? ?Social History  ? ?Socioeconomic History  ? Marital status: Single  ?  Spouse name: Not on file  ? Number of children: Not on file  ? Years of education: Not on file  ? Highest education level: Not on file  ?Occupational History  ? Not on file  ?Tobacco Use  ? Smoking status: Every Day  ?  Packs/day: 0.25  ?  Years: 25.00  ?  Pack years: 6.25  ?  Types: Cigarettes, Cigars  ? Smokeless tobacco: Never  ?Vaping Use  ? Vaping Use: Never used  ?Substance and Sexual Activity  ? Alcohol use: Yes  ?  Comment: daily  ? Drug use: Yes  ?  Types: Marijuana  ? Sexual activity: Not on file  ?Other Topics Concern  ? Not on file  ?Social History Narrative  ? Not on file  ? ?Social  Determinants of Health  ? ?Financial Resource Strain: Not on file  ?Food Insecurity: Not on file  ?Transportation Needs: Not on file  ?Physical Activity: Not on file  ?Stress: Not on file  ?Social Connections: Not on file  ?Intimate Partner Violence: Not on file  ? ? ?Outpatient Medications Prior to Visit  ?Medication Sig Dispense Refill  ? albuterol (VENTOLIN HFA) 108 (90 Base) MCG/ACT inhaler inhale TWO PUFFS into THE lungs every 4-6 hours AS NEEDED FOR shortness OF breath OR FOR WHEEZING 54 g 3  ? allopurinol (ZYLOPRIM) 300 MG tablet TAKE ONE TABLET BY MOUTH EVERY DAY 90 tablet 0  ? ALPRAZolam (XANAX) 0.25 MG tablet Take 1 tablet (0.25 mg total) by mouth 2 (two) times daily as needed. 180 tablet 0  ? amLODipine (NORVASC) 5 MG tablet TAKE ONE TABLET BY MOUTH EVERY DAY 90 tablet 1  ? Azelaic Acid 15 % cream After skin is thoroughly washed and patted dry, gently massage a thin film of azelaic acid cream into the affected area 1-2x daily. 50 g 3  ? colchicine 0.6 MG tablet  Take 1 tablet (0.6 mg total) by mouth daily as needed. 90 tablet 1  ? eplerenone (INSPRA) 25 MG tablet TAKE ONE TABLET BY MOUTH EVERY DAY 90 tablet 3  ? ezetimibe (ZETIA) 10 MG tablet TAKE ONE TABLET BY MOUTH EVERY DAY 90 tablet 3  ? fenofibrate 160 MG tablet TAKE ONE TABLET BY MOUTH EVERY DAY 90 tablet 0  ? fluticasone (FLONASE) 50 MCG/ACT nasal spray Place 1 spray into both nostrils daily. 48 g 3  ? fluticasone-salmeterol (ADVAIR DISKUS) 250-50 MCG/ACT AEPB inhale ONE PUFF into THE lungs TWICE DAILY 180 each 3  ? irbesartan (AVAPRO) 300 MG tablet TAKE ONE TABLET BY MOUTH EVERY DAY 90 tablet 3  ? lansoprazole (PREVACID) 30 MG capsule TAKE ONE CAPSULE BY MOUTH TWICE DAILY BEFORE MEALS 180 capsule 0  ? montelukast (SINGULAIR) 10 MG tablet TAKE ONE TABLET BY MOUTH AT BEDTIME 90 tablet 1  ? SKYRIZI PEN 150 MG/ML SOAJ Inject into the skin.    ? VASCEPA 1 g capsule TAKE TWO CAPSULES BY MOUTH TWICE DAILY 360 capsule 3  ? azithromycin (ZITHROMAX Z-PAK)  250 MG tablet Take 2 tablets (500 mg) on  Day 1,  followed by 1 tablet (250 mg) once daily on Days 2 through 5. 6 tablet 0  ? ?No facility-administered medications prior to visit.  ? ? ?Allergies  ?Allergen Reactions  ? Lipitor [Atorvastatin] Other (See Comments)  ?  Elevated LFTs  ? Tylenol [Acetaminophen]   ? ? ?Review of Systems ? ?   ?Objective:  ?  ?Physical Exam ?Constitutional:   ?   Appearance: He is well-developed.  ?HENT:  ?   Head: Normocephalic and atraumatic.  ?   Right Ear: External ear normal.  ?   Left Ear: External ear normal.  ?   Nose: Nose normal.  ?Eyes:  ?   Conjunctiva/sclera: Conjunctivae normal.  ?   Pupils: Pupils are equal, round, and reactive to light.  ?Neck:  ?   Thyroid: No thyromegaly.  ?Cardiovascular:  ?   Rate and Rhythm: Normal rate.  ?   Heart sounds: Normal heart sounds.  ?Pulmonary:  ?   Effort: Pulmonary effort is normal.  ?   Breath sounds: Normal breath sounds.  ?Musculoskeletal:  ?   Cervical back: Neck supple.  ?Lymphadenopathy:  ?   Cervical: No cervical adenopathy.  ?Skin: ?   General: Skin is warm and dry.  ?Neurological:  ?   Mental Status: He is alert and oriented to person, place, and time.  ? ? ?BP (!) 142/80   Pulse 75   Resp 18   Ht 5\' 10"  (1.778 m)   Wt 200 lb (90.7 kg)   SpO2 98%   BMI 28.70 kg/m?  ?Wt Readings from Last 3 Encounters:  ?07/24/21 200 lb (90.7 kg)  ?01/18/21 197 lb (89.4 kg)  ?11/10/20 197 lb (89.4 kg)  ? ? ?There are no preventive care reminders to display for this patient. ? ?There are no preventive care reminders to display for this patient. ? ? ?Lab Results  ?Component Value Date  ? TSH 1.89 11/10/2020  ? ?Lab Results  ?Component Value Date  ? WBC 5.1 11/10/2020  ? HGB 16.4 11/10/2020  ? HCT 46.3 11/10/2020  ? MCV 97.7 11/10/2020  ? PLT 254 11/10/2020  ? ?Lab Results  ?Component Value Date  ? NA 145 11/10/2020  ? K 3.8 11/10/2020  ? CO2 28 11/10/2020  ? GLUCOSE 93 11/10/2020  ? BUN 18 11/10/2020  ?  CREATININE 0.85 11/10/2020  ? BILITOT  1.0 11/10/2020  ? ALKPHOS 58 07/31/2016  ? AST 89 (H) 11/10/2020  ? ALT 105 (H) 11/10/2020  ? PROT 7.2 11/10/2020  ? ALBUMIN 4.7 07/31/2016  ? CALCIUM 10.1 11/10/2020  ? ?Lab Results  ?Component Value Date  ? CHOL 200 (H) 11/10/2020  ? ?Lab Results  ?Component Value Date  ? HDL 39 (L) 11/10/2020  ? ?Lab Results  ?Component Value Date  ? LDLCALC 138 (H) 11/10/2020  ? ?Lab Results  ?Component Value Date  ? TRIG 112 11/10/2020  ? ?Lab Results  ?Component Value Date  ? CHOLHDL 5.1 (H) 11/10/2020  ? ?Lab Results  ?Component Value Date  ? HGBA1C 5.0 09/28/2019  ? ? ?   ?Assessment & Plan:  ? ?Problem List Items Addressed This Visit   ?None ?Visit Diagnoses   ? ? Mild intermittent asthma with exacerbation    -  Primary  ? Relevant Medications  ? azithromycin (ZITHROMAX Z-PAK) 250 MG tablet  ? Tick bite of left thigh, initial encounter      ? Relevant Medications  ? doxycycline (VIBRA-TABS) 100 MG tablet  ? ?  ? ?Tick bite on left thigh-just some erythema right around the bite no sign of distal rash etc.  He would like prophylactic treatment for Lyme's.  So we will treat with 200 mg of doxycycline x1 dose. ? ?Asthma exacerbation with increased cough and mucus production-he does not tolerate prednisone well.  Prescription for azithromycin given to take only if he feels like he is getting worse later this week.  Encouraged him to increase usage of his albuterol as needed.  No sign of bronchospasm on exam today. ? ? ? ?Meds ordered this encounter  ?Medications  ? azithromycin (ZITHROMAX Z-PAK) 250 MG tablet  ?  Sig: Take 2 tablets (500 mg) on  Day 1,  followed by 1 tablet (250 mg) once daily on Days 2 through 5.  ?  Dispense:  6 tablet  ?  Refill:  0  ? doxycycline (VIBRA-TABS) 100 MG tablet  ?  Sig: Take 2 tablets (200 mg total) by mouth once for 1 dose.  ?  Dispense:  2 tablet  ?  Refill:  0  ? ? ? ?Nani Gasseratherine Krystyne Tewksbury, MD ? ?

## 2021-07-28 ENCOUNTER — Telehealth: Payer: Self-pay

## 2021-07-28 DIAGNOSIS — M51369 Other intervertebral disc degeneration, lumbar region without mention of lumbar back pain or lower extremity pain: Secondary | ICD-10-CM

## 2021-07-28 DIAGNOSIS — M5136 Other intervertebral disc degeneration, lumbar region: Secondary | ICD-10-CM

## 2021-07-28 NOTE — Telephone Encounter (Signed)
Patient reports that he fell and his back started hurting in the same spot again. He did have an injection at [facility] but this one only helped about 70% at 2 weeks. He is questioning whether or not he needs another MRI to get a better picture for targeting since the injection did not give him full relief like last time.  ?

## 2021-07-28 NOTE — Telephone Encounter (Signed)
Not necessarily, a fall will typically injure bone or strained muscle, and an epidural was meant to target a disc so if the problem is not the disc then an epidural should not help, there is also facet arthritis which can be exacerbated by a fall, this would also not be improved by an epidural injection.  I really do not know which structure to inject through this modality so he and I would probably at least need a telephone call to really figured out, my advice however would be to start with an x-ray to ensure no compression fracture. ?

## 2021-07-28 NOTE — Telephone Encounter (Signed)
Patient aware iof Dr. Darene Lamer recommendations and the call was transferred tot he front desk for scheduling.  ?

## 2021-07-28 NOTE — Telephone Encounter (Signed)
Patient called to request a [place] MRI be ordered.  ?

## 2021-07-28 NOTE — Telephone Encounter (Signed)
He just had one in 2021, did something change? ?

## 2021-08-01 ENCOUNTER — Ambulatory Visit (INDEPENDENT_AMBULATORY_CARE_PROVIDER_SITE_OTHER): Payer: BC Managed Care – PPO

## 2021-08-01 DIAGNOSIS — M545 Low back pain, unspecified: Secondary | ICD-10-CM | POA: Diagnosis not present

## 2021-08-01 DIAGNOSIS — M5136 Other intervertebral disc degeneration, lumbar region: Secondary | ICD-10-CM | POA: Diagnosis not present

## 2021-08-01 DIAGNOSIS — W19XXXA Unspecified fall, initial encounter: Secondary | ICD-10-CM

## 2021-08-02 ENCOUNTER — Telehealth: Payer: Self-pay | Admitting: Sports Medicine

## 2021-08-02 DIAGNOSIS — M48061 Spinal stenosis, lumbar region without neurogenic claudication: Secondary | ICD-10-CM

## 2021-08-02 MED ORDER — HYDROCODONE-ACETAMINOPHEN 10-325 MG PO TABS: 1.0000 | ORAL_TABLET | Freq: Three times a day (TID) | ORAL | 0 refills | Status: DC | PRN

## 2021-08-02 NOTE — Telephone Encounter (Signed)
I am going to go ahead and order multilevel facet joint injections, he does have the bulging disc as well as facet arthritis at multiple levels, if the epidural was not effective we will target the facets, in addition we will add a short course of hydrocodone to help block his pain until he can get the facet joint injections. ?

## 2021-08-02 NOTE — Telephone Encounter (Signed)
Patient called and stated that first injection did not work and he is in real bad pain. He was wanting to know if you had time to look at his xray and send a referral to [facility] imaging so he could call them and get something scheduled for tomorrow. He stated tomorrow was the only day he had someone to take him and he was limping around work. I asked if he wanted you to call something in and he stated he was taking advil and did not want anything stronger at this time. - CF ?

## 2021-08-02 NOTE — Assessment & Plan Note (Signed)
Sanel does have multilevel lumbar spinal stenosis, however a lumbar interlaminar epidural was ineffective, he does have multilevel facet arthritis as well so we will proceed with multilevel facet joint injections from L3-S1 bilaterally. ?I will also add some hydrocodone for pain relief in the meantime. ?

## 2021-08-02 NOTE — Telephone Encounter (Signed)
Patient now wants meds! If you are not able to fit him in for an appt.  ?

## 2021-08-08 ENCOUNTER — Other Ambulatory Visit: Payer: Self-pay | Admitting: Sports Medicine

## 2021-08-08 ENCOUNTER — Ambulatory Visit
Admission: RE | Admit: 2021-08-08 | Discharge: 2021-08-08 | Disposition: A | Discharge status: Home / Self Care | Payer: BC Managed Care – PPO | Source: Ambulatory Visit | Attending: Sports Medicine | Admitting: Sports Medicine

## 2021-08-08 DIAGNOSIS — M48061 Spinal stenosis, lumbar region without neurogenic claudication: Secondary | ICD-10-CM

## 2021-08-08 DIAGNOSIS — M545 Low back pain, unspecified: Secondary | ICD-10-CM | POA: Diagnosis not present

## 2021-08-08 MED ORDER — IOPAMIDOL (ISOVUE-M 200) INJECTION 41%
1.0000 mL | Freq: Once | INTRAMUSCULAR | Status: AC
  Administered 2021-08-08: 1 mL via INTRA_ARTICULAR

## 2021-08-08 MED ORDER — METHYLPREDNISOLONE ACETATE 40 MG/ML INJ SUSP (RADIOLOG
120.0000 mg | Freq: Once | INTRAMUSCULAR | Status: AC
  Administered 2021-08-08: 120 mg via INTRA_ARTICULAR

## 2021-08-08 NOTE — Discharge Instructions (Signed)

## 2021-08-22 ENCOUNTER — Ambulatory Visit
Admission: RE | Admit: 2021-08-22 | Discharge: 2021-08-22 | Disposition: A | Discharge status: Home / Self Care | Payer: BC Managed Care – PPO | Source: Ambulatory Visit | Attending: Sports Medicine | Admitting: Sports Medicine

## 2021-08-22 ENCOUNTER — Other Ambulatory Visit: Payer: Self-pay | Admitting: Sports Medicine

## 2021-08-22 DIAGNOSIS — M48061 Spinal stenosis, lumbar region without neurogenic claudication: Secondary | ICD-10-CM

## 2021-08-22 DIAGNOSIS — M4727 Other spondylosis with radiculopathy, lumbosacral region: Secondary | ICD-10-CM | POA: Diagnosis not present

## 2021-08-22 MED ORDER — METHYLPREDNISOLONE ACETATE 40 MG/ML INJ SUSP (RADIOLOG
80.0000 mg | Freq: Once | INTRAMUSCULAR | Status: AC
  Administered 2021-08-22: 80 mg via EPIDURAL

## 2021-08-22 MED ORDER — IOPAMIDOL (ISOVUE-M 200) INJECTION 41%
1.0000 mL | Freq: Once | INTRAMUSCULAR | Status: AC
  Administered 2021-08-22: 1 mL via EPIDURAL

## 2021-08-22 NOTE — Discharge Instructions (Signed)

## 2021-09-14 DIAGNOSIS — M5441 Lumbago with sciatica, right side: Secondary | ICD-10-CM | POA: Diagnosis not present

## 2021-09-14 DIAGNOSIS — M542 Cervicalgia: Secondary | ICD-10-CM | POA: Diagnosis not present

## 2021-09-14 DIAGNOSIS — M546 Pain in thoracic spine: Secondary | ICD-10-CM | POA: Diagnosis not present

## 2021-09-29 ENCOUNTER — Other Ambulatory Visit: Payer: Self-pay | Admitting: Sports Medicine

## 2021-10-02 ENCOUNTER — Telehealth: Payer: Self-pay

## 2021-10-02 DIAGNOSIS — Z111 Encounter for screening for respiratory tuberculosis: Secondary | ICD-10-CM

## 2021-10-02 DIAGNOSIS — M1A9XX0 Chronic gout, unspecified, without tophus (tophi): Secondary | ICD-10-CM

## 2021-10-02 DIAGNOSIS — N139 Obstructive and reflux uropathy, unspecified: Secondary | ICD-10-CM

## 2021-10-02 DIAGNOSIS — I10 Essential (primary) hypertension: Secondary | ICD-10-CM

## 2021-10-02 DIAGNOSIS — Z113 Encounter for screening for infections with a predominantly sexual mode of transmission: Secondary | ICD-10-CM

## 2021-10-02 DIAGNOSIS — R739 Hyperglycemia, unspecified: Secondary | ICD-10-CM

## 2021-10-02 NOTE — Telephone Encounter (Signed)
Ordering all the routine labs, STD screen, with herpes, as well as QuantiFERON gold for tuberculosis.

## 2021-10-02 NOTE — Telephone Encounter (Signed)
Patient called o request that a TB test (due to being on Skyrizi) and HSV testing be added to his yearly bloodwork.

## 2021-10-03 ENCOUNTER — Telehealth: Payer: Self-pay | Admitting: *Deleted

## 2021-10-03 MED ORDER — DEXAMETHASONE 4 MG PO TABS
4.0000 mg | ORAL_TABLET | Freq: Three times a day (TID) | ORAL | 0 refills | Status: DC
Start: 2021-10-03 — End: 2021-10-11

## 2021-10-03 NOTE — Telephone Encounter (Signed)
Pt called wanting to get something for his cough. He was last seen by Dr. Linford Arnold for a tick bite and Asthma on 4/10.   Pt stated that at that time he was given a Z-pack of which he just started Sunday. He said that normally by now his sxs start to go away by this time and they have not.   Pt did attempt to schedule an appt for today to be seen by his pcp or someone but was told that we don't have any appointments today and will need to go to the ED or UC for his sxs.   Pt stated that he has called in before and asked for this to be done for him and Dr. Karie Schwalbe would take care of this for him. I informed him that this is not an option and that he can do an E-visit for his sxs. Pt asked if I would send a note to his pcp and nurse to help him with this. Pt advised that I would send his request.

## 2021-10-03 NOTE — Telephone Encounter (Signed)
Patient has been informed that steroid rx was sent to the pharmacy. Patient voiced understanding that if current medications are ineffective, he must schedule an appointment with provider to be evaluated. No other inquiries during the call.

## 2021-10-03 NOTE — Telephone Encounter (Signed)
Spoke to patient - he is requesting if provider can send in a steroid medication to help with his symptoms while he takes the Z-pak. Per patient, he is unable to take prednisone and was given a similar medication that worked really well for his respiratory issues. Patient could not recall the name of the steroid rx. Patient is currently on day 2 of taking the Z-pak and is not feeling any better.

## 2021-10-03 NOTE — Telephone Encounter (Signed)
Patient has been advised that lab order request is available to complete. No other inquiries during the call.

## 2021-10-03 NOTE — Telephone Encounter (Signed)
May be Decadron, I will send that in if it gets him off of yalls back.

## 2021-10-10 ENCOUNTER — Other Ambulatory Visit: Payer: Self-pay | Admitting: Sports Medicine

## 2021-10-11 ENCOUNTER — Encounter: Payer: Self-pay | Admitting: Physician Assistant

## 2021-10-11 ENCOUNTER — Telehealth (HOSPITAL_BASED_OUTPATIENT_CLINIC_OR_DEPARTMENT_OTHER): Payer: Self-pay | Admitting: Family Medicine

## 2021-10-11 ENCOUNTER — Other Ambulatory Visit: Payer: Self-pay

## 2021-10-11 ENCOUNTER — Ambulatory Visit (INDEPENDENT_AMBULATORY_CARE_PROVIDER_SITE_OTHER): Payer: BC Managed Care – PPO | Admitting: Physician Assistant

## 2021-10-11 ENCOUNTER — Emergency Department (HOSPITAL_BASED_OUTPATIENT_CLINIC_OR_DEPARTMENT_OTHER): Payer: BC Managed Care – PPO

## 2021-10-11 ENCOUNTER — Other Ambulatory Visit: Payer: Self-pay | Admitting: Internal Medicine

## 2021-10-11 ENCOUNTER — Other Ambulatory Visit: Payer: Self-pay | Admitting: Physician Assistant

## 2021-10-11 ENCOUNTER — Ambulatory Visit (INDEPENDENT_AMBULATORY_CARE_PROVIDER_SITE_OTHER): Payer: BC Managed Care – PPO

## 2021-10-11 ENCOUNTER — Inpatient Hospital Stay (HOSPITAL_BASED_OUTPATIENT_CLINIC_OR_DEPARTMENT_OTHER)
Admission: EM | Admit: 2021-10-11 | Discharge: 2021-10-17 | DRG: 871 | Disposition: A | Discharge status: Home / Self Care | Payer: BC Managed Care – PPO | Attending: Internal Medicine | Admitting: Internal Medicine

## 2021-10-11 ENCOUNTER — Encounter (HOSPITAL_BASED_OUTPATIENT_CLINIC_OR_DEPARTMENT_OTHER): Payer: Self-pay

## 2021-10-11 VITALS — BP 115/64 | HR 75 | Temp 98.1°F | Ht 70.0 in | Wt 203.0 lb

## 2021-10-11 DIAGNOSIS — J90 Pleural effusion, not elsewhere classified: Secondary | ICD-10-CM | POA: Diagnosis not present

## 2021-10-11 DIAGNOSIS — Z7951 Long term (current) use of inhaled steroids: Secondary | ICD-10-CM | POA: Diagnosis not present

## 2021-10-11 DIAGNOSIS — J189 Pneumonia, unspecified organism: Secondary | ICD-10-CM | POA: Diagnosis present

## 2021-10-11 DIAGNOSIS — E782 Mixed hyperlipidemia: Secondary | ICD-10-CM | POA: Diagnosis not present

## 2021-10-11 DIAGNOSIS — R051 Acute cough: Secondary | ICD-10-CM | POA: Insufficient documentation

## 2021-10-11 DIAGNOSIS — M109 Gout, unspecified: Secondary | ICD-10-CM | POA: Diagnosis present

## 2021-10-11 DIAGNOSIS — K2100 Gastro-esophageal reflux disease with esophagitis, without bleeding: Secondary | ICD-10-CM | POA: Diagnosis present

## 2021-10-11 DIAGNOSIS — E785 Hyperlipidemia, unspecified: Secondary | ICD-10-CM | POA: Diagnosis not present

## 2021-10-11 DIAGNOSIS — J4521 Mild intermittent asthma with (acute) exacerbation: Secondary | ICD-10-CM | POA: Diagnosis not present

## 2021-10-11 DIAGNOSIS — Z79899 Other long term (current) drug therapy: Secondary | ICD-10-CM | POA: Diagnosis not present

## 2021-10-11 DIAGNOSIS — I10 Essential (primary) hypertension: Secondary | ICD-10-CM | POA: Diagnosis not present

## 2021-10-11 DIAGNOSIS — D72829 Elevated white blood cell count, unspecified: Secondary | ICD-10-CM

## 2021-10-11 DIAGNOSIS — R0602 Shortness of breath: Secondary | ICD-10-CM

## 2021-10-11 DIAGNOSIS — F17290 Nicotine dependence, other tobacco product, uncomplicated: Secondary | ICD-10-CM | POA: Diagnosis not present

## 2021-10-11 DIAGNOSIS — J918 Pleural effusion in other conditions classified elsewhere: Secondary | ICD-10-CM | POA: Diagnosis present

## 2021-10-11 DIAGNOSIS — F411 Generalized anxiety disorder: Secondary | ICD-10-CM | POA: Diagnosis present

## 2021-10-11 DIAGNOSIS — Z720 Tobacco use: Secondary | ICD-10-CM | POA: Diagnosis not present

## 2021-10-11 DIAGNOSIS — Z4682 Encounter for fitting and adjustment of non-vascular catheter: Secondary | ICD-10-CM | POA: Diagnosis not present

## 2021-10-11 DIAGNOSIS — J309 Allergic rhinitis, unspecified: Secondary | ICD-10-CM | POA: Diagnosis present

## 2021-10-11 DIAGNOSIS — M47814 Spondylosis without myelopathy or radiculopathy, thoracic region: Secondary | ICD-10-CM | POA: Diagnosis not present

## 2021-10-11 DIAGNOSIS — F419 Anxiety disorder, unspecified: Secondary | ICD-10-CM | POA: Diagnosis present

## 2021-10-11 DIAGNOSIS — R0789 Other chest pain: Secondary | ICD-10-CM | POA: Diagnosis not present

## 2021-10-11 DIAGNOSIS — J302 Other seasonal allergic rhinitis: Secondary | ICD-10-CM | POA: Diagnosis not present

## 2021-10-11 DIAGNOSIS — J45901 Unspecified asthma with (acute) exacerbation: Secondary | ICD-10-CM | POA: Diagnosis present

## 2021-10-11 DIAGNOSIS — J4541 Moderate persistent asthma with (acute) exacerbation: Secondary | ICD-10-CM | POA: Diagnosis present

## 2021-10-11 DIAGNOSIS — J9811 Atelectasis: Secondary | ICD-10-CM | POA: Diagnosis not present

## 2021-10-11 DIAGNOSIS — A419 Sepsis, unspecified organism: Secondary | ICD-10-CM | POA: Diagnosis not present

## 2021-10-11 DIAGNOSIS — J439 Emphysema, unspecified: Secondary | ICD-10-CM | POA: Diagnosis not present

## 2021-10-11 DIAGNOSIS — J939 Pneumothorax, unspecified: Secondary | ICD-10-CM | POA: Diagnosis not present

## 2021-10-11 DIAGNOSIS — F17210 Nicotine dependence, cigarettes, uncomplicated: Secondary | ICD-10-CM | POA: Diagnosis present

## 2021-10-11 DIAGNOSIS — E876 Hypokalemia: Secondary | ICD-10-CM | POA: Diagnosis present

## 2021-10-11 DIAGNOSIS — R918 Other nonspecific abnormal finding of lung field: Secondary | ICD-10-CM | POA: Diagnosis not present

## 2021-10-11 DIAGNOSIS — R079 Chest pain, unspecified: Secondary | ICD-10-CM | POA: Diagnosis not present

## 2021-10-11 LAB — COMPLETE METABOLIC PANEL WITH GFR
AG Ratio: 1.2 (calc) (ref 1.0–2.5)
ALT: 27 U/L (ref 9–46)
AST: 23 U/L (ref 10–35)
Albumin: 3.5 g/dL — ABNORMAL LOW (ref 3.6–5.1)
Alkaline phosphatase (APISO): 51 U/L (ref 35–144)
BUN: 19 mg/dL (ref 7–25)
CO2: 31 mmol/L (ref 20–32)
Calcium: 9.8 mg/dL (ref 8.6–10.3)
Chloride: 98 mmol/L (ref 98–110)
Creat: 0.86 mg/dL (ref 0.70–1.30)
Globulin: 3 g/dL (calc) (ref 1.9–3.7)
Glucose, Bld: 91 mg/dL (ref 65–99)
Potassium: 3.8 mmol/L (ref 3.5–5.3)
Sodium: 141 mmol/L (ref 135–146)
Total Bilirubin: 1.2 mg/dL (ref 0.2–1.2)
Total Protein: 6.5 g/dL (ref 6.1–8.1)
eGFR: 102 mL/min/{1.73_m2} (ref >=60)

## 2021-10-11 LAB — CBC
HCT: 38.2 % — ABNORMAL LOW (ref 39.0–52.0)
Hemoglobin: 13.2 g/dL (ref 13.0–17.0)
MCH: 33.8 pg (ref 26.0–34.0)
MCHC: 34.6 g/dL (ref 30.0–36.0)
MCV: 97.9 fL (ref 80.0–100.0)
Platelets: 444 10*3/uL — ABNORMAL HIGH (ref 150–400)
RBC: 3.9 MIL/uL — ABNORMAL LOW (ref 4.22–5.81)
RDW: 12.5 % (ref 11.5–15.5)
WBC: 19.2 10*3/uL — ABNORMAL HIGH (ref 4.0–10.5)
nRBC: 0 % (ref 0.0–0.2)

## 2021-10-11 LAB — BASIC METABOLIC PANEL
Anion gap: 14 (ref 5–15)
BUN: 23 mg/dL — ABNORMAL HIGH (ref 6–20)
CO2: 28 mmol/L (ref 22–32)
Calcium: 9.6 mg/dL (ref 8.9–10.3)
Chloride: 99 mmol/L (ref 98–111)
Creatinine, Ser: 0.93 mg/dL (ref 0.61–1.24)
GFR, Estimated: >60 mL/min (ref >60)
Glucose, Bld: 110 mg/dL — ABNORMAL HIGH (ref 70–99)
Potassium: 3.4 mmol/L — ABNORMAL LOW (ref 3.5–5.1)
Sodium: 141 mmol/L (ref 135–145)

## 2021-10-11 LAB — TROPONIN I (HIGH SENSITIVITY)
Troponin I (High Sensitivity): 8 ng/L (ref <18)
Troponin I (High Sensitivity): 8 ng/L (ref <18)

## 2021-10-11 LAB — D-DIMER, QUANTITATIVE: D-Dimer, Quant: 1.2 mcg/mL FEU — ABNORMAL HIGH (ref <0.50)

## 2021-10-11 MED ORDER — DOXYCYCLINE HYCLATE 100 MG PO TABS: 100.0000 mg | ORAL_TABLET | Freq: Two times a day (BID) | ORAL | 0 refills | Status: DC

## 2021-10-11 MED ORDER — HYDROCHLOROTHIAZIDE 12.5 MG PO TABS: 12.5000 mg | ORAL_TABLET | Freq: Every day | ORAL | 0 refills | Status: DC

## 2021-10-11 MED ORDER — SODIUM CHLORIDE 0.9 % IV SOLN
2.0000 g | Freq: Once | INTRAVENOUS | Status: AC
  Administered 2021-10-11: 2 g via INTRAVENOUS
  Filled 2021-10-11: qty 20

## 2021-10-11 MED ORDER — SODIUM CHLORIDE 0.9 % IV SOLN
500.0000 mg | Freq: Once | INTRAVENOUS | Status: AC
  Administered 2021-10-11: 500 mg via INTRAVENOUS
  Filled 2021-10-11: qty 5

## 2021-10-11 MED ORDER — FENTANYL CITRATE PF 50 MCG/ML IJ SOSY
50.0000 ug | PREFILLED_SYRINGE | Freq: Once | INTRAMUSCULAR | Status: AC
  Administered 2021-10-11: 50 ug via INTRAVENOUS
  Filled 2021-10-11: qty 1

## 2021-10-11 MED ORDER — SODIUM CHLORIDE 0.9 % IV SOLN: INTRAVENOUS | Status: DC | PRN

## 2021-10-11 MED ORDER — IOHEXOL 350 MG/ML SOLN
80.0000 mL | Freq: Once | INTRAVENOUS | Status: AC | PRN
  Administered 2021-10-11: 80 mL via INTRAVENOUS

## 2021-10-11 MED ORDER — HYDROMORPHONE HCL 1 MG/ML IJ SOLN
0.5000 mg | Freq: Once | INTRAMUSCULAR | Status: AC
  Administered 2021-10-12: 0.5 mg via INTRAVENOUS
  Filled 2021-10-11: qty 1

## 2021-10-11 MED ORDER — KETOROLAC TROMETHAMINE 60 MG/2ML IM SOLN
60.0000 mg | Freq: Once | INTRAMUSCULAR | Status: AC
  Administered 2021-10-11: 60 mg via INTRAMUSCULAR

## 2021-10-11 MED ORDER — IPRATROPIUM-ALBUTEROL 0.5-2.5 (3) MG/3ML IN SOLN
3.0000 mL | Freq: Once | RESPIRATORY_TRACT | Status: AC
  Administered 2021-10-11: 3 mL via RESPIRATORY_TRACT

## 2021-10-11 NOTE — Telephone Encounter (Signed)
Received call from after-hours nurse regarding critical lab result with elevated D-dimer.  Called and spoke with patient who was seen in the office today for ongoing shortness of breath.  He did have chest x-ray completed as well as labs completed.  Chest x-ray with findings concerning for possible infection and right lower lobe.  He was started on doxycycline.  Spoke with patient who is still having some shortness of breath.  Denies any significant changes since being seen in the office earlier.  Did communicate lab result which showed elevated D-dimer.  Explained what this test is related to and meaning of abnormal test with elevated D-dimer.  Discussed concern that with elevated D-dimer, there is a possibility of underlying blood clot, specifically with concern for a blood clot within the lungs.  Due to this concern, recommendation is to complete a CT scan of the lungs with contrast to further evaluate for blood clot.  Given his symptoms, need for expeditious evaluation, would recommend presenting to the emergency department to have this testing completed.  Patient did voice some reluctance in proceeding to emergency department but ultimately decided that he would go.  He did thanked me for my time and indicated that he would plan to go to either the [facility] emergency department or the emergency department at [facility] at [facility].

## 2021-10-11 NOTE — ED Provider Notes (Signed)
[facility] Provider Note   CSN: [id number] Arrival date & time: 10/11/21  1847     History  Chief Complaint  Patient presents with   Shortness of Breath    [facility] is a 56 y.o. male.  Presents emerged department due to concern for shortness of breath, cough.  Has been had dealing with a cough and some shortness of breath for a few weeks.  Completed a Z-Pak but states this did not help his symptoms at all.  Also completed course of Decadron.  Followed up with primary care office today, blood work, x-ray completed and then patient later advised to come to ER for further eval.  Has some chest pain, worse with deep breath and worse with coughing.  Has history of smoking.  HPI     Home Medications Prior to Admission medications   Medication Sig Start Date End Date Taking? Authorizing Provider  albuterol (VENTOLIN HFA) 108 (90 Base) MCG/ACT inhaler inhale TWO PUFFS into THE lungs every 4-6 hours AS NEEDED FOR shortness OF breath OR FOR WHEEZING 04/18/21   Silverio Decamp, MD  allopurinol (ZYLOPRIM) 300 MG tablet TAKE ONE TABLET BY MOUTH EVERY DAY 07/03/21   Silverio Decamp, MD  ALPRAZolam Duanne Moron) 0.25 MG tablet TAKE ONE TABLET BY MOUTH TWICE DAILY AS NEEDED 10/10/21   Silverio Decamp, MD  amLODipine (NORVASC) 5 MG tablet TAKE ONE TABLET BY MOUTH EVERY DAY 10/02/21   Silverio Decamp, MD  Azelaic Acid 15 % cream After skin is thoroughly washed and patted dry, gently massage a thin film of azelaic acid cream into the affected area 1-2x daily. 08/21/19   Silverio Decamp, MD  colchicine 0.6 MG tablet Take 1 tablet (0.6 mg total) by mouth daily as needed. 08/18/18   Silverio Decamp, MD  doxycycline (VIBRA-TABS) 100 MG tablet Take 1 tablet (100 mg total) by mouth 2 (two) times daily. 10/11/21   Donella Stade, PA-C  eplerenone (INSPRA) 25 MG tablet TAKE ONE TABLET BY MOUTH EVERY DAY 04/18/21   Silverio Decamp, MD   ezetimibe (ZETIA) 10 MG tablet TAKE ONE TABLET BY MOUTH EVERY DAY 04/18/21   Silverio Decamp, MD  fenofibrate 160 MG tablet TAKE ONE TABLET BY MOUTH EVERY DAY 07/03/21   Silverio Decamp, MD  fluticasone (FLONASE) 50 MCG/ACT nasal spray Place 1 spray into both nostrils daily. 10/02/21   Silverio Decamp, MD  fluticasone-salmeterol (ADVAIR DISKUS) 250-50 MCG/ACT AEPB inhale ONE PUFF into THE lungs TWICE DAILY 01/23/21   Silverio Decamp, MD  GUAIFENESIN PO Take by mouth.    [provider]  hydrochlorothiazide (HYDRODIURIL) 12.5 MG tablet Take 1 tablet (12.5 mg total) by mouth daily. 10/11/21   Donella Stade, PA-C  irbesartan (AVAPRO) 300 MG tablet TAKE ONE TABLET BY MOUTH EVERY DAY 04/18/21   Silverio Decamp, MD  lansoprazole (PREVACID) 30 MG capsule TAKE ONE CAPSULE BY MOUTH TWICE DAILY BEFORE MEALS 07/03/21   Silverio Decamp, MD  montelukast (SINGULAIR) 10 MG tablet TAKE ONE TABLET BY MOUTH AT BEDTIME 10/02/21   Silverio Decamp, MD  SKYRIZI PEN 150 MG/ML SOAJ Inject into the skin. 08/02/20   [provider]  VASCEPA 1 g capsule TAKE TWO CAPSULES BY MOUTH TWICE DAILY 10/02/21   Silverio Decamp, MD      Allergies    Lipitor [atorvastatin] and Tylenol [acetaminophen]    Review of Systems   Review of Systems  Constitutional:  Positive for fatigue. Negative for chills and fever.  HENT:  Negative for ear pain and sore throat.   Eyes:  Negative for pain and visual disturbance.  Respiratory:  Positive for cough and shortness of breath.   Cardiovascular:  Positive for chest pain. Negative for palpitations.  Gastrointestinal:  Negative for abdominal pain and vomiting.  Genitourinary:  Negative for dysuria and hematuria.  Musculoskeletal:  Negative for arthralgias and back pain.  Skin:  Negative for color change and rash.  Neurological:  Negative for seizures and syncope.  All other systems reviewed and are negative.   Physical  Exam Updated Vital Signs BP 139/79   Pulse 78   Temp 98.5 F (36.9 C)   Resp (!) 22   Ht 5\' 10"  (1.778 m)   Wt 92.1 kg   SpO2 93%   BMI 29.13 kg/m  Physical Exam Vitals and nursing note reviewed.  Constitutional:      General: He is not in acute distress.    Appearance: He is well-developed.  HENT:     Head: Normocephalic and atraumatic.  Eyes:     Conjunctiva/sclera: Conjunctivae normal.  Cardiovascular:     Rate and Rhythm: Normal rate and regular rhythm.     Heart sounds: No murmur heard. Pulmonary:     Comments: Breath sounds diminished on right side, coarse sounds on right side, patient has slight tachypnea Abdominal:     Palpations: Abdomen is soft.     Tenderness: There is no abdominal tenderness.  Musculoskeletal:        General: No swelling.     Cervical back: Neck supple.  Skin:    General: Skin is warm and dry.     Capillary Refill: Capillary refill takes less than 2 seconds.  Neurological:     Mental Status: He is alert.  Psychiatric:        Mood and Affect: Mood normal.     ED Results / Procedures / Treatments   Labs (all labs ordered are listed, but only abnormal results are displayed) Labs Reviewed  BASIC METABOLIC PANEL - Abnormal; Notable for the following components:      Result Value   Potassium 3.4 (*)    Glucose, Bld 110 (*)    BUN 23 (*)    All other components within normal limits  CBC - Abnormal; Notable for the following components:   WBC 19.2 (*)    RBC 3.90 (*)    HCT 38.2 (*)    Platelets 444 (*)    All other components within normal limits  CULTURE, BLOOD (ROUTINE X 2)  CULTURE, BLOOD (ROUTINE X 2)  TROPONIN I (HIGH SENSITIVITY)  TROPONIN I (HIGH SENSITIVITY)    EKG None  Radiology CT Angio Chest PE W and/or Wo Contrast  Result Date: 10/11/2021 CLINICAL DATA:  Chest pain, shortness of breath EXAM: CT ANGIOGRAPHY CHEST WITH CONTRAST TECHNIQUE: Multidetector CT imaging of the chest was performed using the standard  protocol during bolus administration of intravenous contrast. Multiplanar CT image reconstructions and MIPs were obtained to evaluate the vascular anatomy. RADIATION DOSE REDUCTION: This exam was performed according to the departmental dose-optimization program which includes automated exposure control, adjustment of the mA and/or kV according to patient size and/or use of iterative reconstruction technique. CONTRAST:  58mL OMNIPAQUE IOHEXOL 350 MG/ML SOLN COMPARISON:  Previous chest radiographs done earlier today and CT chest done on 06/09/2020 FINDINGS: Cardiovascular: There is homogeneous enhancement in the thoracic aorta. There is ectasia of ascending thoracic aorta  measuring 3.7 cm. There are no intraluminal filling defects in the central pulmonary artery branches. Evaluation of small peripheral branches in the lower lung fields is limited by motion artifacts and infiltrates. Mediastinum/Nodes: No significant lymphadenopathy seen. Lungs/Pleura: There is moderate to large right pleural effusion. Part of the effusion appears to be loculated in the periphery of right upper and right mid lung fields. Density measurements in the right pleural effusion are less than 10 Hounsfield units. There is no significant focal soft tissue thickening in the pleural surface. There is compression atelectasis in the right lower lobe. Small patchy alveolar and ground-glass densities seen in the right upper lobe and right middle lobe. Left lung is mostly clear. Subtle ground-glass densities seen in the left lower lung fields without focal consolidation. There is no left pleural effusion. There is no pneumothorax. Upper Abdomen: There is calcified gallbladder stone. There is no dilation of bile ducts. There are small low-density foci in the spleen. Musculoskeletal: No acute findings are seen. Review of the MIP images confirms the above findings. IMPRESSION: There is no evidence of central pulmonary artery embolism. There is no  evidence of thoracic aortic dissection. There is moderate to large right pleural effusion part of which is loculated in the periphery of right upper and right mid lung fields. Possibility of empyema is not excluded. Thoracentesis as clinically warranted should be considered. Compression atelectasis is seen in the right lower lobe. There are patchy alveolar and ground-glass infiltrates in the right upper and right mid lung fields. Faint ground-glass densities seen in the left lower lung fields. Findings suggest possible multifocal pneumonia. Gallbladder stone. Other findings as described in the body of the report. Electronically Signed   By: Elmer Picker M.D.   On: 10/11/2021 20:40   DG Chest 2 View  Result Date: 10/11/2021 CLINICAL DATA:  SOB/cough worsening for 5 weeks EXAM: CHEST - 2 VIEW COMPARISON:  Chest CT 06/09/2020 FINDINGS: The cardiomediastinal silhouette is within normal limits. There is a small right pleural effusion and adjacent right lower lung opacities. Left lung is clear. No pneumothorax. There is no acute osseous abnormality. Thoracic spondylosis. IMPRESSION: Small right pleural effusion with adjacent right lower lung opacities which could be atelectasis or infection. Electronically Signed   By: Maurine Simmering M.D.   On: 10/11/2021 11:04    Procedures .Critical Care  Performed by: Lucrezia Starch, MD Authorized by: Lucrezia Starch, MD   Critical care provider statement:    Critical care time (minutes):  44   Critical care was time spent personally by me on the following activities:  Development of treatment plan with patient or surrogate, discussions with consultants, evaluation of patient's response to treatment, examination of patient, ordering and review of laboratory studies, ordering and review of radiographic studies, ordering and performing treatments and interventions, pulse oximetry, re-evaluation of patient's condition and review of old charts     Medications  Ordered in ED Medications  cefTRIAXone (ROCEPHIN) 2 g in sodium chloride 0.9 % 100 mL IVPB (has no administration in time range)  azithromycin (ZITHROMAX) 500 mg in sodium chloride 0.9 % 250 mL IVPB (has no administration in time range)  0.9 %  sodium chloride infusion ( Intravenous New Bag/Given 10/11/21 2224)  iohexol (OMNIPAQUE) 350 MG/ML injection 80 mL (80 mLs Intravenous Contrast Given 10/11/21 2026)  fentaNYL (SUBLIMAZE) injection 50 mcg (50 mcg Intravenous Given 10/11/21 2207)    ED Course/ Medical Decision Making/ A&P  Medical Decision Making Amount and/or Complexity of Data Reviewed Labs: ordered. Radiology: ordered.  Risk Prescription drug management. Decision regarding hospitalization.   56 year old gentleman presenting to ER due to concern for shortness of breath, cough.  I independently reviewed and interpreted lab work from his primary care office as well as chest x-ray from primary care.  Findings concerning for pneumonia, effusion, elevated D-dimer.  Additional lab work obtained here is concerning for leukocytosis.  CTA chest obtained.  I independently reviewed and interpreted results.  There is no acute pulmonary embolism.  Findings are concerning for multifocal pneumonia and loculated pleural effusion with possibility of empyema.  I discussed the findings with pulmonology, Dr. Jayme Cloud.  She advises antibiotics for now, admission to medicine.  She will defer to hospitalist to either consult IR or ask pulmonology to complete either thoracentesis or chest tube placement.  Have started IV antibiotics, ceftriaxone, azithromycin as I suspect this started as community-acquired pneumonia.  Patient is agreeable to admission.  Have discussed with hospitalist.        Final Clinical Impression(s) / ED Diagnoses Final diagnoses:  Multifocal pneumonia  Leukocytosis, unspecified type  Loculated pleural effusion    Rx / DC Orders ED Discharge Orders      None         Milagros Loll, MD 10/11/21 2224

## 2021-10-11 NOTE — Patient Instructions (Signed)
Get labs Get stat CXR Continue to use ventolin as needed and advair daily Stop smoking

## 2021-10-11 NOTE — ED Triage Notes (Signed)
Patient here POV from Home.  Endorses having a Cough for approximately 5 Weeks. Seen by PCP and prescribed Antibiotics and Steroids. Symptoms improved somewhat after Intervention but Symptoms returned.  Seen by PCP again today and had Chest XRAY and Lab Specimens collected which were Abnormal. Sent for ED Evaluation.   Uncomfortable during Triage. A&Ox4. GCS 15. Ambulatory.

## 2021-10-11 NOTE — Addendum Note (Signed)
Addended byLiliana Cline L on: 10/11/2021 11:06 AM   Modules accepted: Orders

## 2021-10-11 NOTE — Plan of Care (Signed)
I have received call from outside hospital [facility] in regards of transfer for this patient.  He is 20M who came to hospital with cough, shortness of breath for past several weeks.  Treated outpatient with azithromycin, Decadron without improvement.  On arrival he was tachypneic but not hypoxic 93% on room air. CT chest, chest x-ray showed left-sided pneumonia, large consolidation with surrounding loculated pleural effusion, with concern for empyema.  Case was discussed with pulmonology recommended IV antibiotics, as well as consideration of thoracentesis versus placement of chest tube.   Patient was accepted in transfer to [facility] for admission to [facility] bed.  Dr Olevia Bowens
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
## 2021-10-11 NOTE — Progress Notes (Signed)
Please call patient:   He has right sided fluid build up on lungs(pleural effusion)with some opacities which look like there is infection. I am sending doxycycline to start for 10 days and a fluid pill to help get some fluid off your lung. Take both for 10 days and then follow up with PCP.

## 2021-10-11 NOTE — Progress Notes (Unsigned)
{  Select_TRH_Note:[id number]} 

## 2021-10-11 NOTE — ED Notes (Signed)
Called [facility] to transport patient to [facility] 31M rm# [id number]

## 2021-10-12 ENCOUNTER — Inpatient Hospital Stay (HOSPITAL_COMMUNITY): Payer: BC Managed Care – PPO

## 2021-10-12 ENCOUNTER — Other Ambulatory Visit: Payer: Self-pay

## 2021-10-12 ENCOUNTER — Encounter (HOSPITAL_COMMUNITY): Payer: Self-pay | Admitting: Internal Medicine

## 2021-10-12 DIAGNOSIS — F411 Generalized anxiety disorder: Secondary | ICD-10-CM

## 2021-10-12 DIAGNOSIS — I10 Essential (primary) hypertension: Secondary | ICD-10-CM | POA: Diagnosis present

## 2021-10-12 DIAGNOSIS — J90 Pleural effusion, not elsewhere classified: Secondary | ICD-10-CM | POA: Diagnosis not present

## 2021-10-12 DIAGNOSIS — A419 Sepsis, unspecified organism: Secondary | ICD-10-CM | POA: Diagnosis present

## 2021-10-12 DIAGNOSIS — K2100 Gastro-esophageal reflux disease with esophagitis, without bleeding: Secondary | ICD-10-CM

## 2021-10-12 DIAGNOSIS — J45901 Unspecified asthma with (acute) exacerbation: Secondary | ICD-10-CM | POA: Diagnosis present

## 2021-10-12 DIAGNOSIS — M109 Gout, unspecified: Secondary | ICD-10-CM | POA: Diagnosis present

## 2021-10-12 DIAGNOSIS — Z7951 Long term (current) use of inhaled steroids: Secondary | ICD-10-CM | POA: Diagnosis not present

## 2021-10-12 DIAGNOSIS — E785 Hyperlipidemia, unspecified: Secondary | ICD-10-CM

## 2021-10-12 DIAGNOSIS — E876 Hypokalemia: Secondary | ICD-10-CM | POA: Diagnosis present

## 2021-10-12 DIAGNOSIS — D72829 Elevated white blood cell count, unspecified: Secondary | ICD-10-CM | POA: Diagnosis not present

## 2021-10-12 DIAGNOSIS — F17210 Nicotine dependence, cigarettes, uncomplicated: Secondary | ICD-10-CM | POA: Diagnosis present

## 2021-10-12 DIAGNOSIS — F419 Anxiety disorder, unspecified: Secondary | ICD-10-CM | POA: Diagnosis present

## 2021-10-12 DIAGNOSIS — J302 Other seasonal allergic rhinitis: Secondary | ICD-10-CM | POA: Diagnosis not present

## 2021-10-12 DIAGNOSIS — J918 Pleural effusion in other conditions classified elsewhere: Secondary | ICD-10-CM | POA: Diagnosis present

## 2021-10-12 DIAGNOSIS — Z79899 Other long term (current) drug therapy: Secondary | ICD-10-CM | POA: Diagnosis not present

## 2021-10-12 DIAGNOSIS — J189 Pneumonia, unspecified organism: Secondary | ICD-10-CM

## 2021-10-12 DIAGNOSIS — F17290 Nicotine dependence, other tobacco product, uncomplicated: Secondary | ICD-10-CM | POA: Diagnosis present

## 2021-10-12 DIAGNOSIS — J309 Allergic rhinitis, unspecified: Secondary | ICD-10-CM | POA: Diagnosis present

## 2021-10-12 DIAGNOSIS — Z720 Tobacco use: Secondary | ICD-10-CM

## 2021-10-12 DIAGNOSIS — E782 Mixed hyperlipidemia: Secondary | ICD-10-CM

## 2021-10-12 DIAGNOSIS — J4541 Moderate persistent asthma with (acute) exacerbation: Secondary | ICD-10-CM

## 2021-10-12 HISTORY — PX: IR THORACENTESIS ASP PLEURAL SPACE W/IMG GUIDE: IMG5380

## 2021-10-12 LAB — URINALYSIS, COMPLETE (UACMP) WITH MICROSCOPIC
Bacteria, UA: NONE SEEN
Bilirubin Urine: NEGATIVE
Glucose, UA: NEGATIVE mg/dL
Hgb urine dipstick: NEGATIVE
Ketones, ur: NEGATIVE mg/dL
Leukocytes,Ua: NEGATIVE
Nitrite: NEGATIVE
Protein, ur: NEGATIVE mg/dL
Specific Gravity, Urine: 1.036 — ABNORMAL HIGH (ref 1.005–1.030)
pH: 5 (ref 5.0–8.0)

## 2021-10-12 LAB — COMPREHENSIVE METABOLIC PANEL
ALT: 26 U/L (ref 0–44)
AST: 22 U/L (ref 15–41)
Albumin: 2.3 g/dL — ABNORMAL LOW (ref 3.5–5.0)
Alkaline Phosphatase: 43 U/L (ref 38–126)
Anion gap: 11 (ref 5–15)
BUN: 20 mg/dL (ref 6–20)
CO2: 27 mmol/L (ref 22–32)
Calcium: 8.6 mg/dL — ABNORMAL LOW (ref 8.9–10.3)
Chloride: 98 mmol/L (ref 98–111)
Creatinine, Ser: 0.88 mg/dL (ref 0.61–1.24)
GFR, Estimated: >60 mL/min (ref >60)
Glucose, Bld: 201 mg/dL — ABNORMAL HIGH (ref 70–99)
Potassium: 3.2 mmol/L — ABNORMAL LOW (ref 3.5–5.1)
Sodium: 136 mmol/L (ref 135–145)
Total Bilirubin: 0.8 mg/dL (ref 0.3–1.2)
Total Protein: 5.8 g/dL — ABNORMAL LOW (ref 6.5–8.1)

## 2021-10-12 LAB — CBC WITH DIFFERENTIAL/PLATELET
Abs Immature Granulocytes: 0.2 10*3/uL — ABNORMAL HIGH (ref 0.00–0.07)
Basophils Absolute: 0 10*3/uL (ref 0.0–0.1)
Basophils Relative: 0 %
Eosinophils Absolute: 0.1 10*3/uL (ref 0.0–0.5)
Eosinophils Relative: 1 %
HCT: 37 % — ABNORMAL LOW (ref 39.0–52.0)
Hemoglobin: 12.7 g/dL — ABNORMAL LOW (ref 13.0–17.0)
Immature Granulocytes: 1 %
Lymphocytes Relative: 9 %
Lymphs Abs: 1.6 10*3/uL (ref 0.7–4.0)
MCH: 34.2 pg — ABNORMAL HIGH (ref 26.0–34.0)
MCHC: 34.3 g/dL (ref 30.0–36.0)
MCV: 99.7 fL (ref 80.0–100.0)
Monocytes Absolute: 1.1 10*3/uL — ABNORMAL HIGH (ref 0.1–1.0)
Monocytes Relative: 6 %
Neutro Abs: 14.9 10*3/uL — ABNORMAL HIGH (ref 1.7–7.7)
Neutrophils Relative %: 83 %
Platelets: 370 10*3/uL (ref 150–400)
RBC: 3.71 MIL/uL — ABNORMAL LOW (ref 4.22–5.81)
RDW: 12.4 % (ref 11.5–15.5)
WBC: 17.9 10*3/uL — ABNORMAL HIGH (ref 4.0–10.5)
nRBC: 0 % (ref 0.0–0.2)

## 2021-10-12 LAB — RAPID URINE DRUG SCREEN, HOSP PERFORMED
Amphetamines: NOT DETECTED
Barbiturates: NOT DETECTED
Benzodiazepines: POSITIVE — AB
Cocaine: NOT DETECTED
Opiates: POSITIVE — AB
Tetrahydrocannabinol: POSITIVE — AB

## 2021-10-12 LAB — BODY FLUID CELL COUNT WITH DIFFERENTIAL
Lymphs, Fluid: 8 %
Monocyte-Macrophage-Serous Fluid: 2 % — ABNORMAL LOW (ref 50–90)
Neutrophil Count, Fluid: 90 % — ABNORMAL HIGH (ref 0–25)
Total Nucleated Cell Count, Fluid: 4675 cu mm — ABNORMAL HIGH (ref 0–1000)

## 2021-10-12 LAB — BLOOD GAS, VENOUS
Acid-Base Excess: 7.7 mmol/L — ABNORMAL HIGH (ref 0.0–2.0)
Bicarbonate: 31 mmol/L — ABNORMAL HIGH (ref 20.0–28.0)
Drawn by: 471111
O2 Saturation: 92.9 %
Patient temperature: 37.1
pCO2, Ven: 38 mmHg — ABNORMAL LOW (ref 44–60)
pH, Ven: 7.52 — ABNORMAL HIGH (ref 7.25–7.43)
pO2, Ven: 59 mmHg — ABNORMAL HIGH (ref 32–45)

## 2021-10-12 LAB — GRAM STAIN

## 2021-10-12 LAB — GLUCOSE, PLEURAL OR PERITONEAL FLUID: Glucose, Fluid: <20 mg/dL

## 2021-10-12 LAB — PROCALCITONIN: Procalcitonin: 0.24 ng/mL

## 2021-10-12 LAB — LACTIC ACID, PLASMA
Lactic Acid, Venous: 1.3 mmol/L (ref 0.5–1.9)
Lactic Acid, Venous: 0.8 mmol/L (ref 0.5–1.9)

## 2021-10-12 LAB — LACTATE DEHYDROGENASE: LDH: 144 U/L (ref 98–192)

## 2021-10-12 LAB — MAGNESIUM: Magnesium: 1.6 mg/dL — ABNORMAL LOW (ref 1.7–2.4)

## 2021-10-12 LAB — PROTEIN, PLEURAL OR PERITONEAL FLUID: Total protein, fluid: 3.4 g/dL

## 2021-10-12 LAB — LACTATE DEHYDROGENASE, PLEURAL OR PERITONEAL FLUID: LD, Fluid: 1554 U/L — ABNORMAL HIGH (ref 3–23)

## 2021-10-12 LAB — PHOSPHORUS: Phosphorus: 2.9 mg/dL (ref 2.5–4.6)

## 2021-10-12 MED ORDER — IPRATROPIUM-ALBUTEROL 0.5-2.5 (3) MG/3ML IN SOLN
3.0000 mL | Freq: Four times a day (QID) | RESPIRATORY_TRACT | Status: DC
  Administered 2021-10-12: 3 mL via RESPIRATORY_TRACT
  Filled 2021-10-12: qty 3

## 2021-10-12 MED ORDER — METHYLPREDNISOLONE SODIUM SUCC 125 MG IJ SOLR
80.0000 mg | Freq: Two times a day (BID) | INTRAMUSCULAR | Status: DC
  Administered 2021-10-12 – 2021-10-13 (×3): 80 mg via INTRAVENOUS
  Filled 2021-10-12 (×3): qty 2

## 2021-10-12 MED ORDER — ALBUTEROL SULFATE (2.5 MG/3ML) 0.083% IN NEBU
2.5000 mg | INHALATION_SOLUTION | RESPIRATORY_TRACT | Status: DC | PRN
  Administered 2021-10-14 – 2021-10-17 (×7): 2.5 mg via RESPIRATORY_TRACT
  Filled 2021-10-12 (×8): qty 3

## 2021-10-12 MED ORDER — ALPRAZOLAM 0.25 MG PO TABS
0.2500 mg | ORAL_TABLET | Freq: Two times a day (BID) | ORAL | Status: DC | PRN
  Administered 2021-10-12 – 2021-10-17 (×8): 0.25 mg via ORAL
  Filled 2021-10-12 (×7): qty 1

## 2021-10-12 MED ORDER — NICOTINE 14 MG/24HR TD PT24: 14.0000 mg | MEDICATED_PATCH | Freq: Every day | TRANSDERMAL | Status: DC | PRN

## 2021-10-12 MED ORDER — FENTANYL CITRATE PF 50 MCG/ML IJ SOSY
50.0000 ug | PREFILLED_SYRINGE | INTRAMUSCULAR | Status: DC | PRN
  Administered 2021-10-12: 50 ug via INTRAVENOUS
  Filled 2021-10-12 (×2): qty 1

## 2021-10-12 MED ORDER — FENOFIBRATE 160 MG PO TABS
160.0000 mg | ORAL_TABLET | Freq: Every day | ORAL | Status: DC
  Administered 2021-10-12 – 2021-10-16 (×5): 160 mg via ORAL
  Filled 2021-10-12 (×5): qty 1

## 2021-10-12 MED ORDER — POTASSIUM CHLORIDE CRYS ER 20 MEQ PO TBCR
40.0000 meq | EXTENDED_RELEASE_TABLET | Freq: Once | ORAL | Status: AC
  Administered 2021-10-12: 40 meq via ORAL
  Filled 2021-10-12: qty 2

## 2021-10-12 MED ORDER — ORAL CARE MOUTH RINSE: 15.0000 mL | OROMUCOSAL | Status: DC | PRN

## 2021-10-12 MED ORDER — MAGNESIUM SULFATE 2 GM/50ML IV SOLN
2.0000 g | Freq: Once | INTRAVENOUS | Status: AC
  Administered 2021-10-12: 2 g via INTRAVENOUS
  Filled 2021-10-12: qty 50

## 2021-10-12 MED ORDER — MELATONIN 3 MG PO TABS
3.0000 mg | ORAL_TABLET | Freq: Every evening | ORAL | Status: DC | PRN
  Administered 2021-10-12: 3 mg via ORAL
  Filled 2021-10-12: qty 1

## 2021-10-12 MED ORDER — PANTOPRAZOLE SODIUM 20 MG PO TBEC
20.0000 mg | DELAYED_RELEASE_TABLET | Freq: Every day | ORAL | Status: DC
  Administered 2021-10-12 – 2021-10-13 (×2): 20 mg via ORAL
  Filled 2021-10-12 (×2): qty 1

## 2021-10-12 MED ORDER — MONTELUKAST SODIUM 10 MG PO TABS
10.0000 mg | ORAL_TABLET | Freq: Every day | ORAL | Status: DC
  Administered 2021-10-12 – 2021-10-16 (×5): 10 mg via ORAL
  Filled 2021-10-12 (×5): qty 1

## 2021-10-12 MED ORDER — IRBESARTAN 300 MG PO TABS
300.0000 mg | ORAL_TABLET | Freq: Every day | ORAL | Status: DC
  Administered 2021-10-12 – 2021-10-16 (×5): 300 mg via ORAL
  Filled 2021-10-12 (×5): qty 1

## 2021-10-12 MED ORDER — EZETIMIBE 10 MG PO TABS
10.0000 mg | ORAL_TABLET | Freq: Every day | ORAL | Status: DC
  Administered 2021-10-12 – 2021-10-16 (×5): 10 mg via ORAL
  Filled 2021-10-12 (×5): qty 1

## 2021-10-12 MED ORDER — IPRATROPIUM-ALBUTEROL 0.5-2.5 (3) MG/3ML IN SOLN
3.0000 mL | Freq: Two times a day (BID) | RESPIRATORY_TRACT | Status: DC
  Administered 2021-10-12 – 2021-10-17 (×10): 3 mL via RESPIRATORY_TRACT
  Filled 2021-10-12 (×10): qty 3

## 2021-10-12 MED ORDER — ALLOPURINOL 300 MG PO TABS
300.0000 mg | ORAL_TABLET | Freq: Every day | ORAL | Status: DC
  Administered 2021-10-12 – 2021-10-16 (×5): 300 mg via ORAL
  Filled 2021-10-12 (×5): qty 1

## 2021-10-12 MED ORDER — ICOSAPENT ETHYL 1 G PO CAPS
2.0000 g | ORAL_CAPSULE | Freq: Two times a day (BID) | ORAL | Status: DC
  Administered 2021-10-12 – 2021-10-16 (×9): 2 g via ORAL
  Filled 2021-10-12 (×12): qty 2

## 2021-10-12 MED ORDER — LIDOCAINE HCL 1 % IJ SOLN
INTRAMUSCULAR | Status: AC
  Filled 2021-10-12: qty 20

## 2021-10-12 MED ORDER — ONDANSETRON HCL 4 MG/2ML IJ SOLN: 4.0000 mg | Freq: Four times a day (QID) | INTRAMUSCULAR | Status: DC | PRN

## 2021-10-12 MED ORDER — LIDOCAINE HCL 1 % IJ SOLN
INTRAMUSCULAR | Status: DC | PRN
  Administered 2021-10-12: 10 mL via INTRADERMAL

## 2021-10-12 MED ORDER — NALOXONE HCL 0.4 MG/ML IJ SOLN: 0.4000 mg | INTRAMUSCULAR | Status: DC | PRN

## 2021-10-12 MED ORDER — DOXYCYCLINE HYCLATE 100 MG PO TABS
100.0000 mg | ORAL_TABLET | Freq: Two times a day (BID) | ORAL | Status: DC
  Administered 2021-10-12 – 2021-10-16 (×9): 100 mg via ORAL
  Filled 2021-10-12 (×9): qty 1

## 2021-10-12 MED ORDER — SODIUM CHLORIDE 0.9 % IV SOLN
1.0000 g | INTRAVENOUS | Status: DC
  Administered 2021-10-12: 1 g via INTRAVENOUS
  Filled 2021-10-12 (×2): qty 10

## 2021-10-12 MED ORDER — SODIUM CHLORIDE 0.9 % IV SOLN
100.0000 mg | Freq: Two times a day (BID) | INTRAVENOUS | Status: DC
  Administered 2021-10-12: 100 mg via INTRAVENOUS
  Filled 2021-10-12 (×2): qty 100

## 2021-10-12 MED ORDER — OXYCODONE HCL 5 MG PO TABS
5.0000 mg | ORAL_TABLET | ORAL | Status: DC | PRN
  Administered 2021-10-12 – 2021-10-17 (×23): 5 mg via ORAL
  Filled 2021-10-12 (×23): qty 1

## 2021-10-12 NOTE — Assessment & Plan Note (Signed)
 #)   Acute asthma exacerbation: In the context of documented history of moderate persistent asthma, suspected acute exacerbation thereof in the setting of progressive shortness of breath, mildly increased work of breathing.  Appears to be as a consequence of presenting community-acquired pneumonia with evidence of multifocal pneumonia as well as moderate to large right-sided pleural effusion, as above.  Outpatient respiratory regimen includes scheduled Advair, prn albuterol inhaler, as well as Singulair.  Of note, the patient is a long-term smoker, currently smoking 1/4 pack over the last 2025 years, although no formal diagnosis of underlying COPD the per chart review thus far.  Plan: Solumedrol 80 mg IV twice daily.  Scheduled duo nebulizers.  As needed albuterol nebulizer.  Check VBG.  Further evaluation management to multifocal pneumonia as well as right-sided pleural effusion, as above, including IV antibiotics, as above.  Check serum magnesium and phosphorus levels.  Continue Singulair.

## 2021-10-12 NOTE — Procedures (Signed)
PROCEDURE SUMMARY:  Successful US guided right thoracentesis. Yielded only 150 mL of hazy yellow fluid. Fluid was loculated. Patient tolerated procedure well. No immediate complications. EBL = trace  Specimen was sent for labs.  Post procedure chest X-ray pending.  Charell Faulk S Lamees Gable PA-C 10/12/2021 8:54 AM

## 2021-10-12 NOTE — Assessment & Plan Note (Signed)
 #)   Sepsis due to right-sided pneumonia: Diagnosis, consistent with CAP, on the basis of 2 weeks of progressive shortness of breath associate with new onset cough, subjective fever, with CT chest showing evidence of right-sided infiltrates consistent with multifocal pneumonia, also noting evidence of moderate to large right-sided pleural effusion, with associated partial loculation.  This is in the context of worsening of symptoms in spite of outpatient azithromycin.  SIRS criteria met via presenting leukocytosis and tachypnea. Of note, in the absence of any evidence of end organ damage, although a stat lactic acid result is currently pending, pt's sepsis does not meet criteria to be considered severe in nature. Also, in the absence of LA level greater than or equal to 4.0, and in the absence of any associated hypotension refractory to IVF's, there are no indications for administration of a 30 mL/kg IVF bolus at this time.   Additional ED work-up/management notable for: Collection of blood cultures x2 prior to initiation of Rocephin and azithromycin.  Given the patient's symptoms progressed in spite of outpatient azithromycin, will initiate doxycycline for atypical coverage, while continuing the azithromycin initiated earlier this evening.  No e/o additional infectious process at this time, but will also check urinalysis to further assess.   Plan: CBC w/ diff and CMP in AM. Follow for results of blood cx's x 2. Abx: Rocephin and doxycycline, as above.  Stat lactic acid level.  Check procalcitonin level.  Check strep pneumoniae urine antigen, Legionella urine antigen, mycoplasma antibodies.  Flutter valve, incentive spirometry.  Check urinalysis.  Further evaluation and management of partially loculated right-sided pleural effusion, as further detailed below.   

## 2021-10-12 NOTE — Progress Notes (Addendum)
Patient admitted after midnight, please see H&P.  Here with pleural effusion/pna.  CTA did not show PE.  Thoracentesis ordered and patient leaving the floor to go down for the procedure now.  Added cytology to labs from thoracentesis.  He expressed multiple concerns about nursing care and I have asked the nurse manager of the floor to follow up with him after his procedure.   Will follow labs from thoracentesis and adjust plan/abx. -added oral pain medications  Marlin Canary DO  Addendum: only 150 able to be removed.  PCCM consult.  Fluid loculated but appears to be infectious.

## 2021-10-12 NOTE — Assessment & Plan Note (Signed)
 #)   Generalized anxiety sorter, document history of such, prn cinoxacin outpatient.  Plan: Continue outpatient as needed Xanax.

## 2021-10-12 NOTE — Plan of Care (Signed)
  Problem: Education: Goal: Knowledge of General Education information will improve Description Including pain rating scale, medication(s)/side effects and non-pharmacologic comfort measures Outcome: Progressing   

## 2021-10-12 NOTE — H&P (View-Only) (Signed)
NAME:  Gabriel Wong, MRN:  [id number], DOB:  01-Jan-1966, LOS: 0 ADMISSION DATE:  10/11/2021, CONSULTATION DATE:  10/12/21 REFERRING MD:  Benjamine Mola - TR[facility], CHIEF COMPLAINT:  loculated pleural effusion    History of Present Illness:  56 yo M PMH mild persistent asthma, HTN,  presented to [facility] ED 6/28 with abnormal labs + SOB. Earlier 6/28 he was seen at PCP for persistent SOB. Sounds like he has had a persistent cough + SOB for a couple of weeks for which he had already completed a zpack and decadron course but hasnt had lasting improvement. Associated fevers and some chest pain. Ddimer was ordered by PCP 6/28 in this setting and when this resulted elevated (1.20), he was referred to ED. CTA obtained in ED, without evidence of PE but showed R sided effusion and multifocal PNA.  Patient was started on abx and admitted to [facility] 6/29. Underwent thora with IR 6/29, but fluid was found to be loculated and only yielded.    PCCM is consulted    Pertinent  Medical History  HTN Mild persistent asthma  Significant Hospital Events: Including procedures, antibiotic start and stop dates in addition to other pertinent events   6/28 [facility] ED per PCP referral after being seen by PCP for SOB. In ED, CTA without PE. Did have R pleural effusion and R multifocal PNA 6/29 admit to [facility] at [facility]. IR Thora -- off, loculated. PCCM consult  Interim History / Subjective:   Underwent IR thoracentesis 6/29, only yellow hazy fluid off due to fluid loculation.   PCCM consulted Objective   Blood pressure 121/68, pulse 82, temperature 98.4 F (36.9 C), temperature source Oral, resp. rate 18, height 5\' 10"  (1.778 m), weight 92.6 kg, SpO2 96 %.        Intake/Output Summary (Last 24 hours) at 10/12/2021 1222 Last data filed at 10/12/2021 0900 Gross per 24 hour  Intake 507.17 ml  Output 0 ml  Net 507.17 ml   Filed Weights   10/11/21 1912 10/12/21 0106  Weight: 92.1 kg 92.6 kg     Examination: General: WDWN middle aged M HENT: NCAT pink mm Lungs: even unlabored Cardiovascular: rr cap refill < 3 sec Abdomen: Protuberant nontender  Extremities: no acute deformity. Trace BLE edema Neuro: AAOx3 following commands GU: defer  Resolved Hospital Problem list     Assessment & Plan:   Right sided pleural effusion Right sided multifocal CAP Sepsis due to above process Acute asthma exacerbation Tobacco use disorder -Thora with IR 6/29, 7/29 yellow hazy fluid off and noted have loculated fluid. Thora labs pending. But clinically, high suspicion for parapneumonic effusion P -will d/w CVTS -- ultimately needs chest tube + tpa/dornase vs surgical intervention.  -doxy, rocephin -steroids, bronchodilators, montelukast -smoking cessation support  Best Practice (right click and "Reselect all SmartList Selections" daily)   Per primary  Labs   CBC: Recent Labs  Lab 10/11/21 1910 10/12/21 0246  WBC 19.2* 17.9*  NEUTROABS  --  14.9*  HGB 13.2 12.7*  HCT 38.2* 37.0*  MCV 97.9 99.7  PLT 444* 370    Basic Metabolic Panel: Recent Labs  Lab 10/11/21 0000 10/11/21 1910 10/12/21 0246  NA 141 141 136  K 3.8 3.4* 3.2*  CL 98 99 98  CO2 31 28 27   GLUCOSE 91 110* 201*  BUN 19 23* 20  CREATININE 0.86 0.93 0.88  CALCIUM 9.8 9.6 8.6*  MG  --   --  1.6*  PHOS  --   --  2.9   GFR: Estimated Creatinine Clearance: 107.1 mL/min (by C-G formula based on SCr of 0.88 mg/dL). Recent Labs  Lab 10/11/21 1910 10/12/21 0246 10/12/21 0717  PROCALCITON  --  0.24  --   WBC 19.2* 17.9*  --   LATICACIDVEN  --  1.3 0.8    Liver Function Tests: Recent Labs  Lab 10/11/21 0000 10/12/21 0246  AST 23 22  ALT 27 26  ALKPHOS  --  43  BILITOT 1.2 0.8  PROT 6.5 5.8*  ALBUMIN  --  2.3*   No results for input(s): "LIPASE", "AMYLASE" in the last 168 hours. No results for input(s): "AMMONIA" in the last 168 hours.  ABG    Component Value Date/Time   HCO3 31.0 (H)  10/12/2021 0250   O2SAT 92.9 10/12/2021 0250     Coagulation Profile: No results for input(s): "INR", "PROTIME" in the last 168 hours.  Cardiac Enzymes: No results for input(s): "CKTOTAL", "CKMB", "CKMBINDEX", "TROPONINI" in the last 168 hours.  HbA1C: Hemoglobin A1C  Date/Time Value Ref Range Status  05/25/2014 12:00 AM 5.0  Final   Hgb A1c MFr Bld  Date/Time Value Ref Range Status  09/28/2019 10:06 AM 5.0 <5.7 % of total Hgb Final    Comment:    For the purpose of screening for the presence of diabetes: . <5.7%       Consistent with the absence of diabetes 5.7-6.4%    Consistent with increased risk for diabetes             (prediabetes) > or =6.5%  Consistent with diabetes . This assay result is consistent with a decreased risk of diabetes. . Currently, no consensus exists regarding use of hemoglobin A1c for diagnosis of diabetes in children. . According to American Diabetes Association (ADA) guidelines, hemoglobin A1c <7.0% represents optimal control in non-pregnant diabetic patients. Different metrics may apply to specific patient populations.  Standards of Medical Care in Diabetes(ADA). Marland Kitchen   08/22/2018 08:47 AM 4.9 <5.7 % of total Hgb Final    Comment:    For the purpose of screening for the presence of diabetes: . <5.7%       Consistent with the absence of diabetes 5.7-6.4%    Consistent with increased risk for diabetes             (prediabetes) > or =6.5%  Consistent with diabetes . This assay result is consistent with a decreased risk of diabetes. . Currently, no consensus exists regarding use of hemoglobin A1c for diagnosis of diabetes in children. . According to American Diabetes Association (ADA) guidelines, hemoglobin A1c <7.0% represents optimal control in non-pregnant diabetic patients. Different metrics may apply to specific patient populations.  Standards of Medical Care in Diabetes(ADA). .     CBG: No results for input(s): "GLUCAP" in  the last 168 hours.  Review of Systems:   Review of Systems  Constitutional:  Positive for fever.  HENT: Negative.    Eyes: Negative.   Respiratory:  Positive for cough and shortness of breath.   Cardiovascular:  Positive for chest pain.  Gastrointestinal: Negative.   Genitourinary: Negative.   Musculoskeletal: Negative.   Skin: Negative.   Neurological: Negative.   Endo/Heme/Allergies: Negative.   Psychiatric/Behavioral: Negative.      Past Medical History:  He,  has a past medical history of Alcoholic hepatitis, Anxiety, Asthma, Gout (04/28/2015), Hyperlipemia, Hypertension, Insomnia, Rosacea, and Seasonal allergies.   Surgical History:   Past Surgical History:  Procedure Laterality Date   BACK SURGERY     FISSURECTOMY       Social History:   reports that he has been smoking cigarettes and cigars. He has a 6.25 pack-year smoking history. He has never used smokeless tobacco. He reports current alcohol use. He reports current drug use. Drug: Marijuana.   Family History:  His family history is not on file.   Allergies Allergies  Allergen Reactions   Lipitor [Atorvastatin] Other (See Comments)    Elevated LFTs   Tylenol [Acetaminophen]      Home Medications  Prior to Admission medications   Medication Sig Start Date End Date Taking? Authorizing Provider  albuterol (VENTOLIN HFA) 108 (90 Base) MCG/ACT inhaler inhale TWO PUFFS into THE lungs every 4-6 hours AS NEEDED FOR shortness OF breath OR FOR WHEEZING 04/18/21   Monica Becton, MD  allopurinol (ZYLOPRIM) 300 MG tablet TAKE ONE TABLET BY MOUTH EVERY DAY 07/03/21   Monica Becton, MD  ALPRAZolam Prudy Feeler) 0.25 MG tablet TAKE ONE TABLET BY MOUTH TWICE DAILY AS NEEDED 10/10/21   Monica Becton, MD  amLODipine (NORVASC) 5 MG tablet TAKE ONE TABLET BY MOUTH EVERY DAY 10/02/21   Monica Becton, MD  Azelaic Acid 15 % cream After skin is thoroughly washed and patted dry, gently massage a thin film of  azelaic acid cream into the affected area 1-2x daily. 08/21/19   Monica Becton, MD  colchicine 0.6 MG tablet Take 1 tablet (0.6 mg total) by mouth daily as needed. 08/18/18   Monica Becton, MD  doxycycline (VIBRA-TABS) 100 MG tablet Take 1 tablet (100 mg total) by mouth 2 (two) times daily. 10/11/21   Jomarie Longs, PA-C  eplerenone (INSPRA) 25 MG tablet TAKE ONE TABLET BY MOUTH EVERY DAY 04/18/21   Monica Becton, MD  ezetimibe (ZETIA) 10 MG tablet TAKE ONE TABLET BY MOUTH EVERY DAY 04/18/21   Monica Becton, MD  fenofibrate 160 MG tablet TAKE ONE TABLET BY MOUTH EVERY DAY 07/03/21   Monica Becton, MD  fluticasone (FLONASE) 50 MCG/ACT nasal spray Place 1 spray into both nostrils daily. 10/02/21   Monica Becton, MD  fluticasone-salmeterol (ADVAIR DISKUS) 250-50 MCG/ACT AEPB inhale ONE PUFF into THE lungs TWICE DAILY 01/23/21   Monica Becton, MD  GUAIFENESIN PO Take by mouth.    [provider]  hydrochlorothiazide (HYDRODIURIL) 12.5 MG tablet Take 1 tablet (12.5 mg total) by mouth daily. 10/11/21   Jomarie Longs, PA-C  irbesartan (AVAPRO) 300 MG tablet TAKE ONE TABLET BY MOUTH EVERY DAY 04/18/21   Monica Becton, MD  lansoprazole (PREVACID) 30 MG capsule TAKE ONE CAPSULE BY MOUTH TWICE DAILY BEFORE MEALS 07/03/21   Monica Becton, MD  montelukast (SINGULAIR) 10 MG tablet TAKE ONE TABLET BY MOUTH AT BEDTIME 10/02/21   Monica Becton, MD  SKYRIZI PEN 150 MG/ML SOAJ Inject into the skin. 08/02/20   [provider]  VASCEPA 1 g capsule TAKE TWO CAPSULES BY MOUTH TWICE DAILY 10/02/21   Monica Becton, MD     Critical care time: n/a     Tessie Fass MSN, AGACNP-BC [facility] Amion for pager 10/12/2021, 1:48 PM

## 2021-10-12 NOTE — Progress Notes (Signed)
PCCM:  Spoke with patient. He is agreeable to pigtail catheter placement tomorrow and lytics  Josephine Igo, DO [facility] 10/12/2021 5:52 PM

## 2021-10-12 NOTE — Consult Note (Addendum)
[address]       [place] [id number]             [phone]        Myrene GalasWayne T Wong [facility] Medical Record #[id number] Date of Birth: 1965/11/07  Referring: Dr. Tonia BroomsIcard Primary Care: Monica Bectonhekkekandam, Thomas J, MD Primary Cardiologist:None  Chief Complaint:    Chief Complaint  Patient presents with   Shortness of Breath    History of Present Illness:      Gabriel HugueninWayne Wong is a 56 yo male with history of HTN, HLD, nicotine abuse, and moderate persistent asthma.  He has been complaining of shortness of breath for the past 2 weeks.  This has been associated with non-productive cough, fevers, chills, rigors.  He was treated with Azithromycin and Dexamethasone on an outpatient regimen.  [facility] with no improvement of symptoms.  He underwent CT scan which showed no evidence of PE but did show a moderate to large right pleural effusion which is partly loculated in the right upper and right mid lung fields.  Thoracentesis was attempted and minimal fluid was removed.  Pulmonary consult was obtained and stated they could have a pigtail catheter placed and install thrombolytics.  Patient would like the fastest treatment option and Thoracic surgery consult has been placed.  The patient is angry and frustrated by his situation.  He states this has been occurring for 5 weeks and he wants to be done with it.  He states that it is very painful to take a deep breath and cough.  Current Activity/ Functional Status: Patient is independent with mobility/ambulation, transfers, ADL's, IADL's.   Past Medical History:  Diagnosis Date   Alcoholic hepatitis    Anxiety    Asthma    Gout 04/28/2015   Hyperlipemia    Hypertension    Insomnia    Rosacea    Seasonal allergies     Past Surgical History:  Procedure Laterality Date   BACK SURGERY     FISSURECTOMY     IR THORACENTESIS ASP PLEURAL SPACE W/IMG GUIDE  10/12/2021   Social History   Tobacco Use   Smoking Status Every Day   Packs/day: 0.25   Years: 25.00   Total pack years: 6.25   Types: Cigarettes, Cigars  Smokeless Tobacco Never    Social History   Substance and Sexual Activity  Alcohol Use Yes    Allergies  Allergen Reactions   Lipitor [Atorvastatin] Other (See Comments)    Elevated LFTs   Tylenol [Acetaminophen]     Current Facility-Administered Medications  Medication Dose Route Frequency Provider Last Rate Last Admin   0.9 %  sodium chloride infusion   Intravenous PRN Milagros Lollykstra, Richard S, MD 10 mL/hr at 10/11/21 2340 New Bag at 10/11/21 2340   albuterol (PROVENTIL) (2.5 MG/3ML) 0.083% nebulizer solution 2.5 mg  2.5 mg Nebulization Q4H PRN Howerter, Justin B, DO       allopurinol (ZYLOPRIM) tablet 300 mg  300 mg Oral Daily Howerter, Justin B, DO   300 mg at 10/12/21 0913   ALPRAZolam (XANAX) tablet 0.25 mg  0.25 mg Oral BID PRN Howerter, Justin B, DO   0.25 mg at 10/12/21 0441   cefTRIAXone (ROCEPHIN) 1 g in sodium chloride 0.9 % 100 mL IVPB  1 g Intravenous Q24H Howerter, Justin B, DO       doxycycline (VIBRA-TABS) tablet 100 mg  100 mg Oral Q12H Vann, Jessica U, DO  ezetimibe (ZETIA) tablet 10 mg  10 mg Oral Daily Howerter, Justin B, DO   10 mg at 10/12/21 0913   fenofibrate tablet 160 mg  160 mg Oral Daily Howerter, Justin B, DO   160 mg at 10/12/21 0913   fentaNYL (SUBLIMAZE) injection 50 mcg  50 mcg Intravenous Q2H PRN Howerter, Justin B, DO   50 mcg at 10/12/21 5009   icosapent Ethyl (VASCEPA) 1 g capsule 2 g  2 g Oral BID Howerter, Justin B, DO   2 g at 10/12/21 0913   ipratropium-albuterol (DUONEB) 0.5-2.5 (3) MG/3ML nebulizer solution 3 mL  3 mL Nebulization BID Vann, Jessica U, DO       irbesartan (AVAPRO) tablet 300 mg  300 mg Oral Daily Marlin Canary U, DO   300 mg at 10/12/21 0913   lidocaine (XYLOCAINE) 1 % (with pres) injection    PRN Gershon Crane, PA-C   10 mL at 10/12/21 3818   melatonin tablet 3 mg  3 mg Oral QHS PRN Howerter, Justin B,  DO       methylPREDNISolone sodium succinate (SOLU-MEDROL) 125 mg/2 mL injection 80 mg  80 mg Intravenous Q12H Howerter, Justin B, DO   80 mg at 10/12/21 0917   montelukast (SINGULAIR) tablet 10 mg  10 mg Oral QHS Howerter, Justin B, DO       naloxone (NARCAN) injection 0.4 mg  0.4 mg Intravenous PRN Howerter, Justin B, DO       nicotine (NICODERM CQ - dosed in mg/24 hours) patch 14 mg  14 mg Transdermal Daily PRN Howerter, Justin B, DO       ondansetron (ZOFRAN) injection 4 mg  4 mg Intravenous Q6H PRN Howerter, Justin B, DO       Oral care mouth rinse  15 mL Mouth Rinse PRN Poplawski, Rafal, MD       oxyCODONE (Oxy IR/ROXICODONE) immediate release tablet 5 mg  5 mg Oral Q4H PRN Marlin Canary U, DO   5 mg at 10/12/21 2993   pantoprazole (PROTONIX) EC tablet 20 mg  20 mg Oral Daily Howerter, Justin B, DO   20 mg at 10/12/21 0913    Medications Prior to Admission  Medication Sig Dispense Refill Last Dose   albuterol (VENTOLIN HFA) 108 (90 Base) MCG/ACT inhaler inhale TWO PUFFS into THE lungs every 4-6 hours AS NEEDED FOR shortness OF breath OR FOR WHEEZING (Patient taking differently: Inhale 2 puffs into the lungs every 6 (six) hours as needed for wheezing or shortness of breath.) 54 g 3    allopurinol (ZYLOPRIM) 300 MG tablet TAKE ONE TABLET BY MOUTH EVERY DAY (Patient taking differently: Take 300 mg by mouth daily.) 90 tablet 0    ALPRAZolam (XANAX) 0.25 MG tablet TAKE ONE TABLET BY MOUTH TWICE DAILY AS NEEDED (Patient taking differently: Take 0.25 mg by mouth 2 (two) times daily as needed for anxiety.) 180 tablet 0    amLODipine (NORVASC) 5 MG tablet TAKE ONE TABLET BY MOUTH EVERY DAY (Patient taking differently: Take 5 mg by mouth daily.) 90 tablet 0    Azelaic Acid 15 % cream After skin is thoroughly washed and patted dry, gently massage a thin film of azelaic acid cream into the affected area 1-2x daily. (Patient taking differently: Apply 1 Application topically 2 (two) times daily.) 50 g 3     colchicine 0.6 MG tablet Take 1 tablet (0.6 mg total) by mouth daily as needed. (Patient taking differently: Take 0.6 mg by mouth daily  as needed (gout flare).) 90 tablet 1    doxycycline (VIBRA-TABS) 100 MG tablet Take 1 tablet (100 mg total) by mouth 2 (two) times daily. 20 tablet 0    eplerenone (INSPRA) 25 MG tablet TAKE ONE TABLET BY MOUTH EVERY DAY (Patient taking differently: Take 25 mg by mouth daily.) 90 tablet 3    ezetimibe (ZETIA) 10 MG tablet TAKE ONE TABLET BY MOUTH EVERY DAY (Patient taking differently: Take 10 mg by mouth daily.) 90 tablet 3    fenofibrate 160 MG tablet TAKE ONE TABLET BY MOUTH EVERY DAY (Patient taking differently: Take 160 mg by mouth daily.) 90 tablet 0    fluticasone (FLONASE) 50 MCG/ACT nasal spray Place 1 spray into both nostrils daily. 48 g 0    fluticasone-salmeterol (ADVAIR DISKUS) 250-50 MCG/ACT AEPB inhale ONE PUFF into THE lungs TWICE DAILY (Patient taking differently: Inhale 1 puff into the lungs in the morning and at bedtime.) 180 each 3    GUAIFENESIN PO Take 1 capsule by mouth daily as needed (mucus).      hydrochlorothiazide (HYDRODIURIL) 12.5 MG tablet Take 1 tablet (12.5 mg total) by mouth daily. 30 tablet 0    irbesartan (AVAPRO) 300 MG tablet TAKE ONE TABLET BY MOUTH EVERY DAY (Patient taking differently: Take 300 mg by mouth daily.) 90 tablet 3    lansoprazole (PREVACID) 30 MG capsule TAKE ONE CAPSULE BY MOUTH TWICE DAILY BEFORE MEALS (Patient taking differently: Take 30 mg by mouth 2 (two) times daily before a meal.) 180 capsule 0    montelukast (SINGULAIR) 10 MG tablet TAKE ONE TABLET BY MOUTH AT BEDTIME (Patient taking differently: Take 10 mg by mouth at bedtime.) 90 tablet 0    SKYRIZI PEN 150 MG/ML SOAJ Inject into the skin.      VASCEPA 1 g capsule TAKE TWO CAPSULES BY MOUTH TWICE DAILY (Patient taking differently: Take 2 g by mouth 2 (two) times daily.) 360 capsule 0     History reviewed. No pertinent family history.   Review of  Systems:   ROS    Cardiac Review of Systems: Y or  [    ]= no  Chest Pain [    ]  Resting SOB [ Y  ] Exertional SOB  [  ]  Orthopnea [  ]   Pedal Edema [   ]    Palpitations [  ] Syncope  [  ]   Presyncope [   ]  General Review of Systems: [Y] = yes [  ]=no Constitional: recent weight change [  ]; anorexia [  ]; fatigue [  ]; nausea [  ]; night sweats [  ]; fever [Y  ]; or chills [  ]                                                               Dental: Last Dentist visit:   Eye : blurred vision [  ]; diplopia [   ]; vision changes [  ];  Amaurosis fugax[  ]; Resp: cough [ Y ];  wheezing[  ];  hemoptysis[N ]; shortness of breath[ Y ]; paroxysmal nocturnal dyspnea[  ]; dyspnea on exertion[  ]; or orthopnea[  ];  GI:  gallstones[  ], vomiting[ N ];  dysphagia[  ]; melena[  ];  hematochezia [  ]; heartburn[  ];   Hx of  Colonoscopy[  ]; GU: kidney stones [  ]; hematuria[  ];   dysuria [  ];  nocturia[  ];  history of     obstruction [  ]; urinary frequency [  ]             Skin: rash, swelling[N  ];, hair loss[  ];  peripheral edema[  ];  or itching[  ]; Musculosketetal: myalgias[Y  ];  joint swelling[  ];  joint erythema[  ];  joint pain[  ];  back pain[Y  ];  Heme/Lymph: bruising[  ];  bleeding[  ];  anemia[  ];  Neuro: TIA[  ];  headaches[  ];  stroke[  ];  vertigo[  ];  seizures[  ];   paresthesias[  ];  difficulty walking[  ];  Psych:depression[  ]; anxiety[  ];  Endocrine: diabetes[ N ];  thyroid dysfunction[N  ];  Physical Exam: BP 121/68 (BP Location: Right Arm)   Pulse 82   Temp 98.4 F (36.9 C) (Oral)   Resp 18   Ht  (1.778 m)   Wt 92.6 kg   SpO2 96%   BMI 29.29 kg/m    General appearance: alert, cooperative, and no distress Head: Normocephalic, without obvious abnormality, atraumatic Resp: diminshed on right Cardio: regular rate and rhythm GI: soft, non-tender; bowel sounds normal; no masses,  no organomegaly Extremities: extremities normal, atraumatic, no  cyanosis or edema Neurologic: Grossly normal  Diagnostic Studies & Laboratory data:     Recent Radiology Findings:   IR THORACENTESIS ASP PLEURAL SPACE W/IMG GUIDE  Result Date: 10/12/2021 INDICATION: Pneumonia with right pleural effusion. Request for diagnostic and therapeutic thoracentesis. EXAM: ULTRASOUND GUIDED RIGHT THORACENTESIS MEDICATIONS: 1% lidocaine 20 mL COMPLICATIONS: None immediate. PROCEDURE: An ultrasound guided thoracentesis was thoroughly discussed with the patient and questions answered. The benefits, risks, alternatives and complications were also discussed. The patient understands and wishes to proceed with the procedure. Written consent was obtained. Ultrasound was performed to localize and mark an adequate pocket of fluid in the right chest. The area was then prepped and draped in the normal sterile fashion. 1% Lidocaine was used for local anesthesia. Under ultrasound guidance a 6 Fr Safe-T-Centesis catheter was introduced. Thoracentesis was performed. The catheter was removed and a dressing applied. FINDINGS: A total of only 150 mL of hazy yellow fluid was removed due to the fluid being loculated. Samples were sent to the laboratory as requested by the clinical team. IMPRESSION: Successful ultrasound guided right thoracentesis yielding only 150 mL of pleural fluid due to fluid being loculated. No pneumothorax on post-procedure chest x-ray. Procedure performed by: Corrin Parker, PA-C Electronically Signed   By: Gilmer Mor D.O.   On: 10/12/2021 15:14   DG Chest 1 View  Result Date: 10/12/2021 CLINICAL DATA:  Status post right thoracentesis EXAM: CHEST  1 VIEW COMPARISON:  CT 10/11/2021, x-ray 10/11/2021 FINDINGS: Moderate to large loculated right-sided pleural effusion, similar in size to the CT but increased compared to the previous x-ray. Patchy airspace opacity throughout the right lung. Left lung is clear. No left-sided pleural effusion. Heart size is stable. No pneumothorax.  IMPRESSION: 1. Moderate to large loculated right-sided pleural effusion, similar in size to the recent CT, but increased compared to previous x-ray. No pneumothorax. 2. Patchy airspace opacity throughout the right lung could represent atelectasis and/or pneumonia. Electronically Signed   By: Duanne Guess D.O.   On: 10/12/2021  09:08   CT Angio Chest PE W and/or Wo Contrast  Result Date: 10/11/2021 CLINICAL DATA:  Chest pain, shortness of breath EXAM: CT ANGIOGRAPHY CHEST WITH CONTRAST TECHNIQUE: Multidetector CT imaging of the chest was performed using the standard protocol during bolus administration of intravenous contrast. Multiplanar CT image reconstructions and MIPs were obtained to evaluate the vascular anatomy. RADIATION DOSE REDUCTION: This exam was performed according to the departmental dose-optimization program which includes automated exposure control, adjustment of the mA and/or kV according to patient size and/or use of iterative reconstruction technique. CONTRAST:  82mL OMNIPAQUE IOHEXOL 350 MG/ML SOLN COMPARISON:  Previous chest radiographs done earlier today and CT chest done on 06/09/2020 FINDINGS: Cardiovascular: There is homogeneous enhancement in the thoracic aorta. There is ectasia of ascending thoracic aorta measuring 3.7 cm. There are no intraluminal filling defects in the central pulmonary artery branches. Evaluation of small peripheral branches in the lower lung fields is limited by motion artifacts and infiltrates. Mediastinum/Nodes: No significant lymphadenopathy seen. Lungs/Pleura: There is moderate to large right pleural effusion. Part of the effusion appears to be loculated in the periphery of right upper and right mid lung fields. Density measurements in the right pleural effusion are less than 10 Hounsfield units. There is no significant focal soft tissue thickening in the pleural surface. There is compression atelectasis in the right lower lobe. Small patchy alveolar and  ground-glass densities seen in the right upper lobe and right middle lobe. Left lung is mostly clear. Subtle ground-glass densities seen in the left lower lung fields without focal consolidation. There is no left pleural effusion. There is no pneumothorax. Upper Abdomen: There is calcified gallbladder stone. There is no dilation of bile ducts. There are small low-density foci in the spleen. Musculoskeletal: No acute findings are seen. Review of the MIP images confirms the above findings. IMPRESSION: There is no evidence of central pulmonary artery embolism. There is no evidence of thoracic aortic dissection. There is moderate to large right pleural effusion part of which is loculated in the periphery of right upper and right mid lung fields. Possibility of empyema is not excluded. Thoracentesis as clinically warranted should be considered. Compression atelectasis is seen in the right lower lobe. There are patchy alveolar and ground-glass infiltrates in the right upper and right mid lung fields. Faint ground-glass densities seen in the left lower lung fields. Findings suggest possible multifocal pneumonia. Gallbladder stone. Other findings as described in the body of the report. Electronically Signed   By: Ernie Avena M.D.   On: 10/11/2021 20:40   DG Chest 2 View  Result Date: 10/11/2021 CLINICAL DATA:  SOB/cough worsening for 5 weeks EXAM: CHEST - 2 VIEW COMPARISON:  Chest CT 06/09/2020 FINDINGS: The cardiomediastinal silhouette is within normal limits. There is a small right pleural effusion and adjacent right lower lung opacities. Left lung is clear. No pneumothorax. There is no acute osseous abnormality. Thoracic spondylosis. IMPRESSION: Small right pleural effusion with adjacent right lower lung opacities which could be atelectasis or infection. Electronically Signed   By: Caprice Renshaw M.D.   On: 10/11/2021 11:04     I have independently reviewed the above radiologic studies and discussed with the  patient   Recent Lab Findings: Lab Results  Component Value Date   WBC 17.9 (H) 10/12/2021   HGB 12.7 (L) 10/12/2021   HCT 37.0 (L) 10/12/2021   PLT 370 10/12/2021   GLUCOSE 201 (H) 10/12/2021   CHOL 200 (H) 11/10/2020   TRIG 112 11/10/2020  HDL 39 (L) 11/10/2020   LDLCALC 138 (H) 11/10/2020   ALT 26 10/12/2021   AST 22 10/12/2021   NA 136 10/12/2021   K 3.2 (L) 10/12/2021   CL 98 10/12/2021   CREATININE 0.88 10/12/2021   BUN 20 10/12/2021   CO2 27 10/12/2021   TSH 1.89 11/10/2020   HGBA1C 5.0 09/28/2019   Assessment / Plan:      Pneumonia- unfortunately did not resolve after antibiotics, now with moderate to right large pleural effusion... attempted Thoracentesis w/o removal of fluid 2.  Right pleural effusion- patient wants this resolved as fast as possible, first available opening in the OR would be Monday.. patient is not happy about this.. he is agreeable to try pigtail with thrombolytic therapy with plans for surgery Monday if this does not resolve 3. HTN 4. Nicotine Abuse 5. HLD   Dr. Cliffton Asters will evaluate patient and follow up with further recommendations  I  spent 55 minutes counseling the patient face to face.   Lowella Dandy, PA-C 10/12/2021 4:04 PM   Agree with above. The only opening would be for Monday of next week, and the patient does not want to wait.  I agree with proceeding with chest tube placement with lytic therapy.  We will reassess later in the week to determine if he needs surgical decortication.  Talana Slatten Keane Scrape

## 2021-10-12 NOTE — Assessment & Plan Note (Signed)
? ? ? ?#)   Allergic Rhinitis: documented h/o such, on scheduled intranasal Flonase as outpatient.  ? ? ?Plan: cont home Flonase.  ? ? ?

## 2021-10-12 NOTE — Assessment & Plan Note (Signed)
 #)   Hyperlipidemia: Documented history of such, on Zetia, fenofibrate, Vascepa.  Plan: Continue outpatient antilipid regimen, as above.

## 2021-10-12 NOTE — Assessment & Plan Note (Signed)
 #)   Essential Hypertension: documented h/o such, with outpatient antihypertensive regimen including eplerenone, HCTZ, Norvasc, irbesartan.  SBP's in the ED today: 120s to 130s mmHg.  However, in the setting of presenting sepsis due to multifocal pneumonia, will hold home antihypertensive medications for now.  Plan: Close monitoring of subsequent BP via routine VS. hold potential medications for now, as above.

## 2021-10-12 NOTE — Assessment & Plan Note (Signed)
(  please see CAP)

## 2021-10-12 NOTE — Assessment & Plan Note (Signed)
 #)   GERD: Documented history of such, on lansoprazole as an outpatient.  Plan: Continue home PPI.

## 2021-10-12 NOTE — Telephone Encounter (Signed)
Thank you. He does not have PE but was admitted for multifocal pneumonia and pleural effusion.

## 2021-10-12 NOTE — Progress Notes (Signed)
New Admission Note:  Arrival Method: [facility] Mental Orientation: Alert and oriented x 4 Telemetry: Box 12 NSR Assessment: Completed Skin: Warm and dry IV: NSL Pain: Denies Tubes: N/A Safety Measures: Safety Fall Prevention Plan initiated.  Admission: Completed 5 M  Orientation: Patient has been orientated to the room, unit and the staff. Welcome booklet given.  Family: None  Orders have been reviewed and implemented. Will continue to monitor the patient. Call light has been placed within reach and bed alarm has been activated.   Guilford Shi BSN, RN  Phone Number: [phone]

## 2021-10-12 NOTE — Assessment & Plan Note (Signed)
 #)   Hypokalemia: Presenting serum potassium Pen-Vee 3.4.  Plan: Potassium chloride 40 mEq p.o. x1 dose now.  Add on serum magnesium level.  Repeat BMP in the morning.

## 2021-10-12 NOTE — Progress Notes (Signed)
Pt went to hospital and admitted.

## 2021-10-12 NOTE — Consult Note (Signed)
 NAME:  Gabriel Wong, MRN:  [id number], DOB:  12/20/1965, LOS: 0 ADMISSION DATE:  10/11/2021, CONSULTATION DATE:  10/12/21 REFERRING MD:  [facility] - [facility], CHIEF COMPLAINT:  loculated pleural effusion    History of Present Illness:  56 yo M PMH mild persistent asthma, HTN,  presented to [facility] ED 6/28 with abnormal labs + SOB. Earlier 6/28 he was seen at PCP for persistent SOB. Sounds like he has had a persistent cough + SOB for a couple of weeks for which he had already completed a zpack and decadron course but hasnt had lasting improvement. Associated fevers and some chest pain. Ddimer was ordered by PCP 6/28 in this setting and when this resulted elevated (1.20), he was referred to ED. CTA obtained in ED, without evidence of PE but showed R sided effusion and multifocal PNA.  Patient was started on abx and admitted to [facility] at [place] 6/29. Underwent thora with IR 6/29, but fluid was found to be loculated and only 150ml yielded.    PCCM is consulted    Pertinent  Medical History  HTN Mild persistent asthma  Significant Hospital Events: Including procedures, antibiotic start and stop dates in addition to other pertinent events   6/28 [facility] ED per PCP referral after being seen by PCP for SOB. In ED, CTA without PE. Did have R pleural effusion and R multifocal PNA 6/29 admit to [facility] at [facility]. IR Thora -- 150ml off, loculated. PCCM consult  Interim History / Subjective:   Underwent IR thoracentesis 6/29, only 150ml yellow hazy fluid off due to fluid loculation.   PCCM consulted Objective   Blood pressure 121/68, pulse 82, temperature 98.4 F (36.9 C), temperature source Oral, resp. rate 18, height 5' 10" (1.778 m), weight 92.6 kg, SpO2 96 %.        Intake/Output Summary (Last 24 hours) at 10/12/2021 1222 Last data filed at 10/12/2021 0900 Gross per 24 hour  Intake 507.17 ml  Output 0 ml  Net 507.17 ml   Filed Weights   10/11/21 1912 10/12/21 0106  Weight: 92.1 kg 92.6 kg     Examination: General: WDWN middle aged M HENT: NCAT pink mm Lungs: even unlabored Cardiovascular: rr cap refill < 3 sec Abdomen: Protuberant nontender  Extremities: no acute deformity. Trace BLE edema Neuro: AAOx3 following commands GU: defer  Resolved Hospital Problem list     Assessment & Plan:   Right sided pleural effusion Right sided multifocal CAP Sepsis due to above process Acute asthma exacerbation Tobacco use disorder -Thora with IR 6/29, 150ml yellow hazy fluid off and noted have loculated fluid. Thora labs pending. But clinically, high suspicion for parapneumonic effusion P -will d/w CVTS -- ultimately needs chest tube + tpa/dornase vs surgical intervention.  -doxy, rocephin -steroids, bronchodilators, montelukast -smoking cessation support  Best Practice (right click and "Reselect all SmartList Selections" daily)   Per primary  Labs   CBC: Recent Labs  Lab 10/11/21 1910 10/12/21 0246  WBC 19.2* 17.9*  NEUTROABS  --  14.9*  HGB 13.2 12.7*  HCT 38.2* 37.0*  MCV 97.9 99.7  PLT 444* 370    Basic Metabolic Panel: Recent Labs  Lab 10/11/21 0000 10/11/21 1910 10/12/21 0246  NA 141 141 136  K 3.8 3.4* 3.2*  CL 98 99 98  CO2 31 28 27  GLUCOSE 91 110* 201*  BUN 19 23* 20  CREATININE 0.86 0.93 0.88  CALCIUM 9.8 9.6 8.6*  MG  --   --  1.6*    PHOS  --   --  2.9   GFR: Estimated Creatinine Clearance: 107.1 mL/min (by C-G formula based on SCr of 0.88 mg/dL). Recent Labs  Lab 10/11/21 1910 10/12/21 0246 10/12/21 0717  PROCALCITON  --  0.24  --   WBC 19.2* 17.9*  --   LATICACIDVEN  --  1.3 0.8    Liver Function Tests: Recent Labs  Lab 10/11/21 0000 10/12/21 0246  AST 23 22  ALT 27 26  ALKPHOS  --  43  BILITOT 1.2 0.8  PROT 6.5 5.8*  ALBUMIN  --  2.3*   No results for input(s): "LIPASE", "AMYLASE" in the last 168 hours. No results for input(s): "AMMONIA" in the last 168 hours.  ABG    Component Value Date/Time   HCO3 31.0 (H)  10/12/2021 0250   O2SAT 92.9 10/12/2021 0250     Coagulation Profile: No results for input(s): "INR", "PROTIME" in the last 168 hours.  Cardiac Enzymes: No results for input(s): "CKTOTAL", "CKMB", "CKMBINDEX", "TROPONINI" in the last 168 hours.  HbA1C: Hemoglobin A1C  Date/Time Value Ref Range Status  05/25/2014 12:00 AM 5.0  Final   Hgb A1c MFr Bld  Date/Time Value Ref Range Status  09/28/2019 10:06 AM 5.0 <5.7 % of total Hgb Final    Comment:    For the purpose of screening for the presence of diabetes: . <5.7%       Consistent with the absence of diabetes 5.7-6.4%    Consistent with increased risk for diabetes             (prediabetes) > or =6.5%  Consistent with diabetes . This assay result is consistent with a decreased risk of diabetes. . Currently, no consensus exists regarding use of hemoglobin A1c for diagnosis of diabetes in children. . According to American Diabetes Association (ADA) guidelines, hemoglobin A1c <7.0% represents optimal control in non-pregnant diabetic patients. Different metrics may apply to specific patient populations.  Standards of Medical Care in Diabetes(ADA). Marland Kitchen   08/22/2018 08:47 AM 4.9 <5.7 % of total Hgb Final    Comment:    For the purpose of screening for the presence of diabetes: . <5.7%       Consistent with the absence of diabetes 5.7-6.4%    Consistent with increased risk for diabetes             (prediabetes) > or =6.5%  Consistent with diabetes . This assay result is consistent with a decreased risk of diabetes. . Currently, no consensus exists regarding use of hemoglobin A1c for diagnosis of diabetes in children. . According to American Diabetes Association (ADA) guidelines, hemoglobin A1c <7.0% represents optimal control in non-pregnant diabetic patients. Different metrics may apply to specific patient populations.  Standards of Medical Care in Diabetes(ADA). .     CBG: No results for input(s): "GLUCAP" in  the last 168 hours.  Review of Systems:   Review of Systems  Constitutional:  Positive for fever.  HENT: Negative.    Eyes: Negative.   Respiratory:  Positive for cough and shortness of breath.   Cardiovascular:  Positive for chest pain.  Gastrointestinal: Negative.   Genitourinary: Negative.   Musculoskeletal: Negative.   Skin: Negative.   Neurological: Negative.   Endo/Heme/Allergies: Negative.   Psychiatric/Behavioral: Negative.      Past Medical History:  He,  has a past medical history of Alcoholic hepatitis, Anxiety, Asthma, Gout (04/28/2015), Hyperlipemia, Hypertension, Insomnia, Rosacea, and Seasonal allergies.   Surgical History:   Past Surgical History:  Procedure Laterality Date   BACK SURGERY     FISSURECTOMY       Social History:   reports that he has been smoking cigarettes and cigars. He has a 6.25 pack-year smoking history. He has never used smokeless tobacco. He reports current alcohol use. He reports current drug use. Drug: Marijuana.   Family History:  His family history is not on file.   Allergies Allergies  Allergen Reactions   Lipitor [Atorvastatin] Other (See Comments)    Elevated LFTs   Tylenol [Acetaminophen]      Home Medications  Prior to Admission medications   Medication Sig Start Date End Date Taking? Authorizing Provider  albuterol (VENTOLIN HFA) 108 (90 Base) MCG/ACT inhaler inhale TWO PUFFS into THE lungs every 4-6 hours AS NEEDED FOR shortness OF breath OR FOR WHEEZING 04/18/21   Monica Becton, MD  allopurinol (ZYLOPRIM) 300 MG tablet TAKE ONE TABLET BY MOUTH EVERY DAY 07/03/21   Monica Becton, MD  ALPRAZolam Prudy Feeler) 0.25 MG tablet TAKE ONE TABLET BY MOUTH TWICE DAILY AS NEEDED 10/10/21   Monica Becton, MD  amLODipine (NORVASC) 5 MG tablet TAKE ONE TABLET BY MOUTH EVERY DAY 10/02/21   Monica Becton, MD  Azelaic Acid 15 % cream After skin is thoroughly washed and patted dry, gently massage a thin film of  azelaic acid cream into the affected area 1-2x daily. 08/21/19   Monica Becton, MD  colchicine 0.6 MG tablet Take 1 tablet (0.6 mg total) by mouth daily as needed. 08/18/18   Monica Becton, MD  doxycycline (VIBRA-TABS) 100 MG tablet Take 1 tablet (100 mg total) by mouth 2 (two) times daily. 10/11/21   Jomarie Longs, PA-C  eplerenone (INSPRA) 25 MG tablet TAKE ONE TABLET BY MOUTH EVERY DAY 04/18/21   Monica Becton, MD  ezetimibe (ZETIA) 10 MG tablet TAKE ONE TABLET BY MOUTH EVERY DAY 04/18/21   Monica Becton, MD  fenofibrate 160 MG tablet TAKE ONE TABLET BY MOUTH EVERY DAY 07/03/21   Monica Becton, MD  fluticasone (FLONASE) 50 MCG/ACT nasal spray Place 1 spray into both nostrils daily. 10/02/21   Monica Becton, MD  fluticasone-salmeterol (ADVAIR DISKUS) 250-50 MCG/ACT AEPB inhale ONE PUFF into THE lungs TWICE DAILY 01/23/21   Monica Becton, MD  GUAIFENESIN PO Take by mouth.    [provider]  hydrochlorothiazide (HYDRODIURIL) 12.5 MG tablet Take 1 tablet (12.5 mg total) by mouth daily. 10/11/21   Jomarie Longs, PA-C  irbesartan (AVAPRO) 300 MG tablet TAKE ONE TABLET BY MOUTH EVERY DAY 04/18/21   Monica Becton, MD  lansoprazole (PREVACID) 30 MG capsule TAKE ONE CAPSULE BY MOUTH TWICE DAILY BEFORE MEALS 07/03/21   Monica Becton, MD  montelukast (SINGULAIR) 10 MG tablet TAKE ONE TABLET BY MOUTH AT BEDTIME 10/02/21   Monica Becton, MD  SKYRIZI PEN 150 MG/ML SOAJ Inject into the skin. 08/02/20   [provider]  VASCEPA 1 g capsule TAKE TWO CAPSULES BY MOUTH TWICE DAILY 10/02/21   Monica Becton, MD     Critical care time: n/a     Tessie Fass MSN, AGACNP-BC [facility] Amion for pager 10/12/2021, 1:48 PM

## 2021-10-12 NOTE — Plan of Care (Signed)
  Problem: Education: Goal: Knowledge of General Education information will improve Description: Including pain rating scale, medication(s)/side effects and non-pharmacologic comfort measures Outcome: Completed/Met

## 2021-10-12 NOTE — H&P (Signed)
History and Physical    PLEASE NOTE THAT DRAGON DICTATION SOFTWARE WAS USED IN THE CONSTRUCTION OF THIS NOTE.   Gabriel Wong LOV:[id number] DOB: [birth year] DOA: 10/11/2021  PCP: Gabriel Decamp, MD  Patient coming from: home   I have personally briefly reviewed patient's old medical records in [place]  Chief Complaint: sob  HPI: Gabriel Wong is a 56 y.o. male with medical history significant for moderate persistent asthma, hypertension, hyperlipidemia, allergic rhinitis, who is admitted to [facility] on 10/11/2021 by way of transfer from [facility] ED with community-acquired pneumonia after presenting from home to the latter complaining of shortness of breath.   The patient reports approximately 2 weeks of shortness of shortness of breath associated with new onset nonproductive cough as well as subjective fever in the absence of chills, fully rigors, or generalized myalgias.  Not associate with any orthopnea, PND, or worsening peripheral edema.  No recent chest pain, diaphoresis, palpitations, nausea, vomiting, presyncope, syncope.  He also denies any recent hemoptysis, new lower extremity erythema or calf tenderness.    Denies any recent new abdominal pain, diarrhea, rash.  No recent dysuria or gross hematuria.  Conveys history of moderate persistent asthma, will denying any known history of chronic supplemental oxygen requirements.  Conveys that he is a current smoker, having smoked approximately 1/4 pack/day over the course of the last 25 years.  In the setting of the above symptoms, he was treated with both azithromycin as well as dexamethasone as an outpatient, noting no significant improvement with these interventions, ultimately resulting in his presentation to [facility] ED on 10/11/2021 for further evaluation and management thereof.     [facility] ED Course:  Vital signs in the ED were notable for the following: Afebrile; heart rate 70-86; blood pressure 127/70;  respiratory rate 16-25, oxygen saturation 94 to 96% on room air.  Labs were notable for the following: BMP notable for the following: Potassium 3.2, prn 28, creatinine 0.93, high-sensitivity troponin I x2 both found to be 8.  D-dimer 1.20.  CBC notable for will with cell count 19,200, hemoglobin 13.2.  Blood cultures x2 collected prior to initiation of IV antibiotics.  Imaging and additional notable ED work-up: EKG, comparison to most recent prior from January 2017 shows sinus rhythm with heart rate 78, normal intervals, nonspecific T wave inversion in lead III, unchanged from most recent prior EKG, also showing less than 1 mm ST depression in leads I, V3 through V6, no evidence of ST elevation.  CTA chest with PE protocol showed no evidence of acute pulmonary embolism and also demonstrated no evidence of aortic aneurysm/dissection, will demonstrating evidence of right-sided infiltrates in the right upper and middle lobes, suggestive of multifocal pneumonia.  CTA chest also showed evidence of moderate to large right pleural effusion, with partial loculation, and associated evidence of compressive atelectasis involving the right lower lobe.  No evidence of overt edema, no evidence of pneumothorax.  EDP discussed case and imaging with on-call pulmonology who rec IV abx as well as consideration for thoracentesis vs chest tube placement.   While in the ED, the following were administered: Fentanyl 50 mcg IV x1, Dilaudid 0.5 mg IV x1, azithromycin, Rocephin.  Subsequently, the patient was transferred to [facility] for admission for sepsis due to community-acquired pneumonia associated with right-sided pleural effusion, with evidence of partial loculation, complicated by suspected acute asthma exacerbation.      Review of Systems: As per HPI otherwise 10 point review of systems negative.  Past Medical History:  Diagnosis Date   Alcoholic hepatitis    Anxiety    Asthma    Gout 04/28/2015   Hyperlipemia     Hypertension    Insomnia    Rosacea    Seasonal allergies     Past Surgical History:  Procedure Laterality Date   BACK SURGERY     FISSURECTOMY      Social History:  reports that he has been smoking cigarettes and cigars. He has a 6.25 pack-year smoking history. He has never used smokeless tobacco. He reports current alcohol use. He reports current drug use. Drug: Marijuana.   Allergies  Allergen Reactions   Lipitor [Atorvastatin] Other (See Comments)    Elevated LFTs   Tylenol [Acetaminophen]     History reviewed. No pertinent family history.    Prior to Admission medications   Medication Sig Start Date End Date Taking? Authorizing Provider  albuterol (VENTOLIN HFA) 108 (90 Base) MCG/ACT inhaler inhale TWO PUFFS into THE lungs every 4-6 hours AS NEEDED FOR shortness OF breath OR FOR WHEEZING 04/18/21   Gabriel Decamp, MD  allopurinol (ZYLOPRIM) 300 MG tablet TAKE ONE TABLET BY MOUTH EVERY DAY 07/03/21   Gabriel Decamp, MD  ALPRAZolam Duanne Moron) 0.25 MG tablet TAKE ONE TABLET BY MOUTH TWICE DAILY AS NEEDED 10/10/21   Gabriel Decamp, MD  amLODipine (NORVASC) 5 MG tablet TAKE ONE TABLET BY MOUTH EVERY DAY 10/02/21   Gabriel Decamp, MD  Azelaic Acid 15 % cream After skin is thoroughly washed and patted dry, gently massage a thin film of azelaic acid cream into the affected area 1-2x daily. 08/21/19   Gabriel Decamp, MD  colchicine 0.6 MG tablet Take 1 tablet (0.6 mg total) by mouth daily as needed. 08/18/18   Gabriel Decamp, MD  doxycycline (VIBRA-TABS) 100 MG tablet Take 1 tablet (100 mg total) by mouth 2 (two) times daily. 10/11/21   Donella Stade, PA-C  eplerenone (INSPRA) 25 MG tablet TAKE ONE TABLET BY MOUTH EVERY DAY 04/18/21   Gabriel Decamp, MD  ezetimibe (ZETIA) 10 MG tablet TAKE ONE TABLET BY MOUTH EVERY DAY 04/18/21   Gabriel Decamp, MD  fenofibrate 160 MG tablet TAKE ONE TABLET BY MOUTH EVERY DAY 07/03/21   Gabriel Decamp, MD  fluticasone (FLONASE) 50 MCG/ACT nasal spray Place 1 spray into both nostrils daily. 10/02/21   Gabriel Decamp, MD  fluticasone-salmeterol (ADVAIR DISKUS) 250-50 MCG/ACT AEPB inhale ONE PUFF into THE lungs TWICE DAILY 01/23/21   Gabriel Decamp, MD  GUAIFENESIN PO Take by mouth.    [provider]  hydrochlorothiazide (HYDRODIURIL) 12.5 MG tablet Take 1 tablet (12.5 mg total) by mouth daily. 10/11/21   Donella Stade, PA-C  irbesartan (AVAPRO) 300 MG tablet TAKE ONE TABLET BY MOUTH EVERY DAY 04/18/21   Gabriel Decamp, MD  lansoprazole (PREVACID) 30 MG capsule TAKE ONE CAPSULE BY MOUTH TWICE DAILY BEFORE MEALS 07/03/21   Gabriel Decamp, MD  montelukast (SINGULAIR) 10 MG tablet TAKE ONE TABLET BY MOUTH AT BEDTIME 10/02/21   Gabriel Decamp, MD  SKYRIZI PEN 150 MG/ML SOAJ Inject into the skin. 08/02/20   [provider]  VASCEPA 1 g capsule TAKE TWO CAPSULES BY MOUTH TWICE DAILY 10/02/21   Gabriel Decamp, MD     Objective    Physical Exam: Vitals:   10/11/21 2115 10/11/21 2336 10/12/21 0000 10/12/21 0106  BP: 139/79  127/70 131/80  Pulse: 78  82 86  Resp: (!) _0 Temp:  98.3 F (36.8 C) 98.7 F (37.1 C) 98.8 F (37.1 C)  TempSrc:  Oral Oral Oral  SpO2: 93%  96% 95%  Weight:    92.6 kg  Height:    _1  (1.778 m)    General: appears to be stated age; alert, oriented Skin: warm, dry, no rash Head:  AT/[place] Mouth:  Oral mucosa membranes appear moist, normal dentition Neck: supple; trachea midline Heart:  RRR; did not appreciate any M/R/G Lungs: diminished RLL breath sounds; otherwisie CTAB, did not appreciate any wheezes, rales, or rhonchi Abdomen: + BS; soft, ND, NT Vascular: 2+ pedal pulses b/l; 2+ radial pulses b/l Extremities: no peripheral edema, no muscle wasting Neuro: strength and sensation intact in upper and lower extremities b/l    Labs on Admission: I have personally reviewed following  labs and imaging studies  CBC: Recent Labs  Lab 10/11/21 1910  WBC 19.2*  HGB 13.2  HCT 38.2*  MCV 97.9  PLT 007*   Basic Metabolic Panel: Recent Labs  Lab 10/11/21 0000 10/11/21 1910  NA 141 141  K 3.8 3.4*  CL 98 99  CO2 31 28  GLUCOSE 91 110*  BUN 19 23*  CREATININE 0.86 0.93  CALCIUM 9.8 9.6   GFR: Estimated Creatinine Clearance: 101.4 mL/min (by C-G formula based on SCr of 0.93 mg/dL). Liver Function Tests: Recent Labs  Lab 10/11/21 0000  AST 23  ALT 27  BILITOT 1.2  PROT 6.5   No results for input(s): "LIPASE", "AMYLASE" in the last 168 hours. No results for input(s): "AMMONIA" in the last 168 hours. Coagulation Profile: No results for input(s): "INR", "PROTIME" in the last 168 hours. Cardiac Enzymes: No results for input(s): "CKTOTAL", "CKMB", "CKMBINDEX", "TROPONINI" in the last 168 hours. BNP (last 3 results) No results for input(s): "PROBNP" in the last 8760 hours. HbA1C: No results for input(s): "HGBA1C" in the last 72 hours. CBG: No results for input(s): "GLUCAP" in the last 168 hours. Lipid Profile: No results for input(s): "CHOL", "HDL", "LDLCALC", "TRIG", "CHOLHDL", "LDLDIRECT" in the last 72 hours. Thyroid Function Tests: No results for input(s): "TSH", "T4TOTAL", "FREET4", "T3FREE", "THYROIDAB" in the last 72 hours. Anemia Panel: No results for input(s): "VITAMINB12", "FOLATE", "FERRITIN", "TIBC", "IRON", "RETICCTPCT" in the last 72 hours. Urine analysis:    Component Value Date/Time   BILIRUBINUR small 04/30/2018 1317   KETONESUR negative 06/13/2012 1349   PROTEINUR Negative 04/30/2018 1317   UROBILINOGEN 1.0 04/30/2018 1317   NITRITE negative 04/30/2018 1317   LEUKOCYTESUR Negative 04/30/2018 1317    Radiological Exams on Admission: CT Angio Chest PE W and/or Wo Contrast  Result Date: 10/11/2021 CLINICAL DATA:  Chest pain, shortness of breath EXAM: CT ANGIOGRAPHY CHEST WITH CONTRAST TECHNIQUE: Multidetector CT imaging of the  chest was performed using the standard protocol during bolus administration of intravenous contrast. Multiplanar CT image reconstructions and MIPs were obtained to evaluate the vascular anatomy. RADIATION DOSE REDUCTION: This exam was performed according to the departmental dose-optimization program which includes automated exposure control, adjustment of the mA and/or kV according to patient size and/or use of iterative reconstruction technique. CONTRAST:  81m OMNIPAQUE IOHEXOL 350 MG/ML SOLN COMPARISON:  Previous chest radiographs done earlier today and CT chest done on 06/09/2020 FINDINGS: Cardiovascular: There is homogeneous enhancement in the thoracic aorta. There is ectasia of ascending thoracic aorta measuring 3.7 cm. There are no intraluminal filling defects in the central pulmonary artery branches. Evaluation of  small peripheral branches in the lower lung fields is limited by motion artifacts and infiltrates. Mediastinum/Nodes: No significant lymphadenopathy seen. Lungs/Pleura: There is moderate to large right pleural effusion. Part of the effusion appears to be loculated in the periphery of right upper and right mid lung fields. Density measurements in the right pleural effusion are less than 10 Hounsfield units. There is no significant focal soft tissue thickening in the pleural surface. There is compression atelectasis in the right lower lobe. Small patchy alveolar and ground-glass densities seen in the right upper lobe and right middle lobe. Left lung is mostly clear. Subtle ground-glass densities seen in the left lower lung fields without focal consolidation. There is no left pleural effusion. There is no pneumothorax. Upper Abdomen: There is calcified gallbladder stone. There is no dilation of bile ducts. There are small low-density foci in the spleen. Musculoskeletal: No acute findings are seen. Review of the MIP images confirms the above findings. IMPRESSION: There is no evidence of central  pulmonary artery embolism. There is no evidence of thoracic aortic dissection. There is moderate to large right pleural effusion part of which is loculated in the periphery of right upper and right mid lung fields. Possibility of empyema is not excluded. Thoracentesis as clinically warranted should be considered. Compression atelectasis is seen in the right lower lobe. There are patchy alveolar and ground-glass infiltrates in the right upper and right mid lung fields. Faint ground-glass densities seen in the left lower lung fields. Findings suggest possible multifocal pneumonia. Gallbladder stone. Other findings as described in the body of the report. Electronically Signed   By: Elmer Picker M.D.   On: 10/11/2021 20:40   DG Chest 2 View  Result Date: 10/11/2021 CLINICAL DATA:  SOB/cough worsening for 5 weeks EXAM: CHEST - 2 VIEW COMPARISON:  Chest CT 06/09/2020 FINDINGS: The cardiomediastinal silhouette is within normal limits. There is a small right pleural effusion and adjacent right lower lung opacities. Left lung is clear. No pneumothorax. There is no acute osseous abnormality. Thoracic spondylosis. IMPRESSION: Small right pleural effusion with adjacent right lower lung opacities which could be atelectasis or infection. Electronically Signed   By: Maurine Simmering M.D.   On: 10/11/2021 11:04     EKG: Independently reviewed, with result as described above.    Assessment/Plan   Principal Problem:   CAP (community acquired pneumonia) Active Problems:   Generalized anxiety disorder   Mixed hyperlipidemia   Essential hypertension   Gastroesophageal reflux disease with esophagitis   Sepsis ([place])   Hypokalemia   Acute asthma exacerbation   Allergic rhinitis      #) Sepsis due to right-sided pneumonia: Diagnosis, consistent with CAP, on the basis of 2 weeks of progressive shortness of breath associate with new onset cough, subjective fever, with CT chest showing evidence of right-sided  infiltrates consistent with multifocal pneumonia, also noting evidence of moderate to large right-sided pleural effusion, with associated partial loculation.  This is in the context of worsening of symptoms in spite of outpatient azithromycin.  SIRS criteria met via presenting leukocytosis and tachypnea. Of note, in the absence of any evidence of end organ damage, although a stat lactic acid result is currently pending, pt's sepsis does not meet criteria to be considered severe in nature. Also, in the absence of LA level greater than or equal to 4.0, and in the absence of any associated hypotension refractory to IVF's, there are no indications for administration of a 30 mL/kg IVF bolus at this time.  Additional ED work-up/management notable for: Collection of blood cultures x2 prior to initiation of Rocephin and azithromycin.  Given the patient's symptoms progressed in spite of outpatient azithromycin, will initiate doxycycline for atypical coverage, while continuing the azithromycin initiated earlier this evening.  No e/o additional infectious process at this time, but will also check urinalysis to further assess.   Plan: CBC w/ diff and CMP in AM. Follow for results of blood cx's x 2. Abx: Rocephin and doxycycline, as above.  Stat lactic acid level.  Check procalcitonin level.  Check strep pneumoniae urine antigen, Legionella urine antigen, mycoplasma antibodies.  Flutter valve, incentive spirometry.  Check urinalysis.  Further evaluation and management of partially loculated right-sided pleural effusion, as further detailed below.         #) Right-sided pleural effusion: CTA chest shows evidence of moderate to large right-sided pleural effusion associated with potential partial loculation as well as a degree of compressive atelectasis involving the right lower lobe. Per discussions between [place] and on-call pulm, there was associated recommendation for thoracentesis vs chest tube placement.   Given partially loculated nature of the pleural effusion, will start with diagnostic thoracentesis, with associated IR consultation for the morning.  Given presentation appears consistent with sepsis due to right-sided pneumonia, differential at this time includes parapneumonic effusion.   Plan: IR consulted and order placed for diagnostic thoracentesis to occur in the morning, with associated pleural fluid analysis including Gram stain, culture, cell count with differential, LDH, glucose, protein, triglycerides, pH. Will also check serum LDH for further evaluation via lights criteria.  CMP tomorrow, including for serum total protein. Acid cx with reflex sensitivity, with consideration for checking ADH.  IV antibiotics, as above. Uds.         #) Hypokalemia: Presenting serum potassium Pen-Vee 3.4.  Plan: Potassium chloride 40 mEq p.o. x1 dose now.  Add on serum magnesium level.  Repeat BMP in the morning.          #) Acute asthma exacerbation: In the context of documented history of moderate persistent asthma, suspected acute exacerbation thereof in the setting of progressive shortness of breath, mildly increased work of breathing.  Appears to be as a consequence of presenting community-acquired pneumonia with evidence of multifocal pneumonia as well as moderate to large right-sided pleural effusion, as above.  Outpatient respiratory regimen includes scheduled Advair, prn albuterol inhaler, as well as Singulair.  Of note, the patient is a long-term smoker, currently smoking 1/4 pack over the last 2025 years, although no formal diagnosis of underlying COPD the per chart review thus far.  Plan: Solumedrol 80 mg IV twice daily.  Scheduled duo nebulizers.  As needed albuterol nebulizer.  Check VBG.  Further evaluation management to multifocal pneumonia as well as right-sided pleural effusion, as above, including IV antibiotics, as above.  Check serum magnesium and phosphorus levels.  Continue  Singulair.           #) Generalized anxiety sorter, document history of such, prn cinoxacin outpatient.  Plan: Continue outpatient as needed Xanax.           #) Essential Hypertension: documented h/o such, with outpatient antihypertensive regimen including eplerenone, HCTZ, Norvasc, irbesartan.  SBP's in the ED today: 120s to 130s mmHg.  However, in the setting of presenting sepsis due to multifocal pneumonia, will hold home antihypertensive medications for now.  Plan: Close monitoring of subsequent BP via routine VS. hold potential medications for now, as above.          #)  Hyperlipidemia: Documented history of such, on Zetia, fenofibrate, Vascepa.  Plan: Continue outpatient antilipid regimen, as above.            #) Allergic Rhinitis: documented h/o such, on scheduled intranasal Flonase as outpatient.    Plan: cont home Flonase.           #) GERD: Documented history of such, on lansoprazole as an outpatient.  Plan: Continue home PPI.        DVT prophylaxis: SCD's   Code Status: Full code Family Communication: none Disposition Plan: Per Rounding Team Consults called: IR consult order placed for the AM;  Admission status: inpatient; med-tele    PLEASE NOTE THAT DRAGON DICTATION SOFTWARE WAS USED IN THE CONSTRUCTION OF THIS NOTE.   Spicer DO [facility]  From [place]   10/12/2021, 2:08 AM

## 2021-10-13 ENCOUNTER — Encounter (HOSPITAL_COMMUNITY)
Admission: EM | Disposition: A | Discharge status: Home / Self Care | Payer: Self-pay | Source: Home / Self Care | Attending: Internal Medicine

## 2021-10-13 ENCOUNTER — Inpatient Hospital Stay (HOSPITAL_COMMUNITY): Payer: BC Managed Care – PPO

## 2021-10-13 ENCOUNTER — Encounter (HOSPITAL_COMMUNITY): Payer: Self-pay | Admitting: Internal Medicine

## 2021-10-13 DIAGNOSIS — J189 Pneumonia, unspecified organism: Secondary | ICD-10-CM | POA: Diagnosis not present

## 2021-10-13 DIAGNOSIS — E876 Hypokalemia: Secondary | ICD-10-CM | POA: Diagnosis not present

## 2021-10-13 DIAGNOSIS — J90 Pleural effusion, not elsewhere classified: Secondary | ICD-10-CM

## 2021-10-13 DIAGNOSIS — I10 Essential (primary) hypertension: Secondary | ICD-10-CM | POA: Diagnosis not present

## 2021-10-13 HISTORY — PX: CHEST TUBE INSERTION: SHX231

## 2021-10-13 LAB — LEGIONELLA PNEUMOPHILA SEROGP 1 UR AG: L. pneumophila Serogp 1 Ur Ag: NEGATIVE

## 2021-10-13 LAB — BODY FLUID CELL COUNT WITH DIFFERENTIAL
Eos, Fluid: 0 %
Lymphs, Fluid: 5 %
Monocyte-Macrophage-Serous Fluid: 2 % — ABNORMAL LOW (ref 50–90)
Neutrophil Count, Fluid: 93 % — ABNORMAL HIGH (ref 0–25)
Total Nucleated Cell Count, Fluid: 10230 cu mm — ABNORMAL HIGH (ref 0–1000)

## 2021-10-13 LAB — LACTATE DEHYDROGENASE, PLEURAL OR PERITONEAL FLUID: LD, Fluid: 1430 U/L — ABNORMAL HIGH (ref 3–23)

## 2021-10-13 LAB — ACID FAST SMEAR (AFB, MYCOBACTERIA): Acid Fast Smear: NEGATIVE

## 2021-10-13 LAB — ALBUMIN, PLEURAL OR PERITONEAL FLUID: Albumin, Fluid: 1.9 g/dL

## 2021-10-13 LAB — MYCOPLASMA PNEUMONIAE ANTIBODY, IGM: Mycoplasma pneumo IgM: <770 U/mL (ref 0–769)

## 2021-10-13 LAB — TRIGLYCERIDES, BODY FLUIDS: Triglycerides, Fluid: 37 mg/dL

## 2021-10-13 LAB — CYTOLOGY - NON PAP

## 2021-10-13 LAB — PROTEIN, PLEURAL OR PERITONEAL FLUID: Total protein, fluid: 3.8 g/dL

## 2021-10-13 LAB — LACTATE DEHYDROGENASE: LDH: 199 U/L — ABNORMAL HIGH (ref 98–192)

## 2021-10-13 SURGERY — INSERTION, PLEURAL DRAINAGE CATHETER
Anesthesia: General | Laterality: Right

## 2021-10-13 MED ORDER — PREDNISONE 20 MG PO TABS
40.0000 mg | ORAL_TABLET | Freq: Every day | ORAL | Status: AC
Start: 2021-10-14 — End: 2021-10-17
  Administered 2021-10-14 – 2021-10-17 (×4): 40 mg via ORAL
  Filled 2021-10-13 (×4): qty 2

## 2021-10-13 MED ORDER — SODIUM CHLORIDE 0.9 % IV SOLN
1.0000 g | INTRAVENOUS | Status: DC
  Administered 2021-10-13 – 2021-10-15 (×3): 1 g via INTRAVENOUS
  Filled 2021-10-13 (×3): qty 10

## 2021-10-13 MED ORDER — PANTOPRAZOLE SODIUM 20 MG PO TBEC
20.0000 mg | DELAYED_RELEASE_TABLET | Freq: Two times a day (BID) | ORAL | Status: DC
  Administered 2021-10-13 – 2021-10-16 (×7): 20 mg via ORAL
  Filled 2021-10-13 (×7): qty 1

## 2021-10-13 MED ORDER — DIPHENHYDRAMINE HCL 25 MG PO CAPS
25.0000 mg | ORAL_CAPSULE | Freq: Every evening | ORAL | Status: DC | PRN
  Administered 2021-10-13 – 2021-10-16 (×4): 25 mg via ORAL
  Filled 2021-10-13 (×4): qty 1

## 2021-10-13 MED ORDER — POTASSIUM CHLORIDE CRYS ER 20 MEQ PO TBCR
40.0000 meq | EXTENDED_RELEASE_TABLET | Freq: Once | ORAL | Status: AC
  Administered 2021-10-13: 40 meq via ORAL
  Filled 2021-10-13: qty 2

## 2021-10-13 MED ORDER — SODIUM CHLORIDE (PF) 0.9 % IJ SOLN
10.0000 mg | Freq: Once | INTRAMUSCULAR | Status: AC
  Administered 2021-10-13: 10 mg via INTRAPLEURAL
  Filled 2021-10-13 (×2): qty 10

## 2021-10-13 MED ORDER — MOMETASONE FURO-FORMOTEROL FUM 200-5 MCG/ACT IN AERO
2.0000 | INHALATION_SPRAY | Freq: Two times a day (BID) | RESPIRATORY_TRACT | Status: DC
  Administered 2021-10-13 – 2021-10-17 (×9): 2 via RESPIRATORY_TRACT
  Filled 2021-10-13: qty 8.8

## 2021-10-13 MED ORDER — STERILE WATER FOR INJECTION IJ SOLN
5.0000 mg | Freq: Once | RESPIRATORY_TRACT | Status: AC
  Administered 2021-10-13: 5 mg via INTRAPLEURAL
  Filled 2021-10-13 (×2): qty 5

## 2021-10-13 MED ORDER — SODIUM CHLORIDE 0.9% FLUSH
10.0000 mL | Freq: Three times a day (TID) | INTRAVENOUS | Status: DC
  Administered 2021-10-13 – 2021-10-14 (×5): 10 mL

## 2021-10-13 NOTE — Progress Notes (Signed)
Pts Home medications sent to [facility]

## 2021-10-13 NOTE — Procedures (Signed)
Pleural Fibrinolytic Administration Procedure Note  Gabriel Wong  [id number]  [birth year]  Date:10/13/21  Time:4:04 PM   Provider Performing:Chelsey Redondo [name]   Procedure: Pleural Fibrinolysis Initial day (65681)  Indication(s) Fibrinolysis of complicated pleural effusion  Consent Risks of the procedure as well as the alternatives and risks of each were explained to the patient and/or caregiver.  Consent for the procedure was obtained.  Anesthesia None  Time Out Verified patient identification, verified procedure, site/side was marked, verified correct patient position, special equipment/implants available, medications/allergies/relevant history reviewed, required imaging and test results available.  Sterile Technique Hand hygiene, gloves  Procedure Description Existing pleural catheter was cleaned and accessed in sterile manner.  10mg  of tPA in 30cc of saline and 5mg  of dornase in 30cc of sterile water were injected into pleural space using existing pleural catheter.  Catheter will be clamped for 1 hour and then placed back to suction.  Complications/Tolerance None; patient tolerated the procedure well.  EBL None  Specimen(s) None  , DO [facility] 10/13/2021 4:04 PM

## 2021-10-13 NOTE — Op Note (Signed)
Insertion of Chest Tube Procedure Note  Gabriel Wong  [id number]  09-28-1965  Date:10/13/21  Time:2:57 PM   Provider Performing: Rachel Bo Gustin Zobrist   Procedure: Pleural Catheter Insertion w/ Imaging Guidance (36468)  Indication(s) Effusion  Consent Risks of the procedure as well as the alternatives and risks of each were explained to the patient and/or caregiver.  Consent for the procedure was obtained and is signed in the bedside chart  Anesthesia Topical only with 1% lidocaine   Time Out Verified patient identification, verified procedure, site/side was marked, verified correct patient position, special equipment/implants available, medications/allergies/relevant history reviewed, required imaging and test results available.  Sterile Technique Maximal sterile technique including full sterile barrier drape, hand hygiene, sterile gown, sterile gloves, mask, hair covering, sterile ultrasound probe cover (if used).  Procedure Description Ultrasound used to identify appropriate pleural anatomy for placement and overlying skin marked. Area of placement cleaned and draped in sterile fashion.  A 14 French pigtail pleural catheter was placed into the right pleural space using Seldinger technique. Appropriate return of fluid was obtained.  The tube was connected to atrium and placed on -20 cm H2O wall suction.  Complications/Tolerance None; patient tolerated the procedure well. Chest X-ray is ordered to verify placement.  EBL Minimal  Specimen(s) fluid  Josephine Igo, DO [facility] 10/13/2021 2:58 PM

## 2021-10-13 NOTE — Progress Notes (Signed)
PROGRESS NOTE    Gabriel Wong  HYQ:[id number] DOB: 02-09-1966 DOA: 10/11/2021 PCP: Monica Becton, MD    Brief Narrative:   Gabriel Wong is a 56 y.o. male with medical history significant for moderate persistent asthma, hypertension, hyperlipidemia, allergic rhinitis, who is admitted to [facility] on 10/11/2021 by way of transfer from [facility] with community-acquired pneumonia after presenting from home to the latter complaining of shortness of breath. Found to have loculated pleural effusion.  Plan for pig tail catheter and possibly lytics.   Assessment and Plan: Sepsis due to CAP (community acquired pneumonia) - Abx: Rocephin and doxycycline, as above.   -attempted thoracentesis but only 150cc removed due to loculation PCCM consult: plan for pig tail catheter -CVTS following: We will reassess later in the week to determine if he needs surgical decortication.   Allergic rhinitis - cont home Flonase.    ? Acute asthma exacerbation -admitted started on Solumedrol 80 mg IV twice daily- will wean to PO and then off after 5 days.  -Scheduled duo nebulizers.  -Continue Singulair.  Hypokalemia/hypomagnesemia -replete   Essential hypertension -resume home meds as able   Generalized anxiety disorder  Continue outpatient as needed Xanax.      DVT prophylaxis: SCDs Start: 10/12/21 0131    Code Status: Full Code Family Communication:   Disposition Plan:  Level of care: Med-Surg Status is: Inpatient Remains inpatient appropriate because: needs procedure    Consultants:  CVTS PCCM   Subjective: No SOB, no CP  Objective: Vitals:   10/13/21 0431 10/13/21 0829 10/13/21 0849 10/13/21 0929  BP:    (!) 153/85  Pulse: 76   88  Resp: 20   18  Temp:    97.8 F (36.6 C)  TempSrc:    Oral  SpO2: 93% 93% 93% 94%  Weight:      Height:        Intake/Output Summary (Last 24 hours) at 10/13/2021 1127 Last data filed at 10/13/2021 0600 Gross per 24  hour  Intake 720 ml  Output 0 ml  Net 720 ml   Filed Weights   10/11/21 1912 10/12/21 0106  Weight: 92.1 kg 92.6 kg    Examination:    General: Appearance:     Overweight male in no acute distress, verbose     Lungs:      respirations unlabored  Heart:    Normal heart rate.   MS:   All extremities are intact.   Neurologic:   Awake, alert, oriented x 3. No apparent focal neurological           defect.        Data Reviewed: I have personally reviewed following labs and imaging studies  CBC: Recent Labs  Lab 10/11/21 1910 10/12/21 0246  WBC 19.2* 17.9*  NEUTROABS  --  14.9*  HGB 13.2 12.7*  HCT 38.2* 37.0*  MCV 97.9 99.7  PLT 444* 370   Basic Metabolic Panel: Recent Labs  Lab 10/11/21 0000 10/11/21 1910 10/12/21 0246  NA 141 141 136  K 3.8 3.4* 3.2*  CL 98 99 98  CO2 31 28 27   GLUCOSE 91 110* 201*  BUN 19 23* 20  CREATININE 0.86 0.93 0.88  CALCIUM 9.8 9.6 8.6*  MG  --   --  1.6*  PHOS  --   --  2.9   GFR: Estimated Creatinine Clearance: 107.1 mL/min (by C-G formula based on SCr of 0.88 mg/dL). Liver Function Tests: Recent Labs  Lab 10/11/21 0000 10/12/21 0246  AST 23 22  ALT 27 26  ALKPHOS  --  43  BILITOT 1.2 0.8  PROT 6.5 5.8*  ALBUMIN  --  2.3*   No results for input(s): "LIPASE", "AMYLASE" in the last 168 hours. No results for input(s): "AMMONIA" in the last 168 hours. Coagulation Profile: No results for input(s): "INR", "PROTIME" in the last 168 hours. Cardiac Enzymes: No results for input(s): "CKTOTAL", "CKMB", "CKMBINDEX", "TROPONINI" in the last 168 hours. BNP (last 3 results) No results for input(s): "PROBNP" in the last 8760 hours. HbA1C: No results for input(s): "HGBA1C" in the last 72 hours. CBG: No results for input(s): "GLUCAP" in the last 168 hours. Lipid Profile: No results for input(s): "CHOL", "HDL", "LDLCALC", "TRIG", "CHOLHDL", "LDLDIRECT" in the last 72 hours. Thyroid Function Tests: No results for input(s): "TSH",  "T4TOTAL", "FREET4", "T3FREE", "THYROIDAB" in the last 72 hours. Anemia Panel: No results for input(s): "VITAMINB12", "FOLATE", "FERRITIN", "TIBC", "IRON", "RETICCTPCT" in the last 72 hours. Sepsis Labs: Recent Labs  Lab 10/12/21 0246 10/12/21 0717  PROCALCITON 0.24  --   LATICACIDVEN 1.3 0.8    Recent Results (from the past 240 hour(s))  Blood culture (routine x 2)     Status: None (Preliminary result)   Collection Time: 10/11/21  9:54 PM   Specimen: BLOOD RIGHT ARM  Result Value Ref Range Status   Specimen Description BLOOD RIGHT ARM RT [facility]  Final   Special Requests   Final    BOTTLES DRAWN AEROBIC AND ANAEROBIC Blood Culture adequate volume Performed at [facility], [address] [place], [place] [address]    Culture PENDING  Incomplete   Report Status PENDING  Incomplete  Gram stain     Status: None   Collection Time: 10/12/21  9:22 AM   Specimen: Lung, Right; Pleural Fluid  Result Value Ref Range Status   Specimen Description FLUID  Final   Special Requests PLEURAL  Final   Gram Stain   Final    FEW WBC PRESENT, PREDOMINANTLY PMN RARE GRAM POSITIVE COCCI IN PAIRS RARE SQUAMOUS EPITHELIAL CELLS PRESENT Gram Stain Report Called to,Read Back By and Verified With:  C/ AMBER C., RN 10/12/21 1142 A. LAFRANCE Performed at [facility], [address], [place], [place] [address]    Report Status 10/12/2021 FINAL  Final         Radiology Studies: IR THORACENTESIS ASP PLEURAL SPACE W/IMG GUIDE  Result Date: 10/12/2021 INDICATION: Pneumonia with right pleural effusion. Request for diagnostic and therapeutic thoracentesis. EXAM: ULTRASOUND GUIDED RIGHT THORACENTESIS MEDICATIONS: 1% lidocaine 20 mL COMPLICATIONS: None immediate. PROCEDURE: An ultrasound guided thoracentesis was thoroughly discussed with the patient and questions answered. The benefits, risks, alternatives and complications were also discussed. The patient understands and wishes to proceed with the  procedure. Written consent was obtained. Ultrasound was performed to localize and mark an adequate pocket of fluid in the right chest. The area was then prepped and draped in the normal sterile fashion. 1% Lidocaine was used for local anesthesia. Under ultrasound guidance a 6 Fr Safe-T-Centesis catheter was introduced. Thoracentesis was performed. The catheter was removed and a dressing applied. FINDINGS: A total of only 150 mL of hazy yellow fluid was removed due to the fluid being loculated. Samples were sent to the laboratory as requested by the clinical team. IMPRESSION: Successful ultrasound guided right thoracentesis yielding only 150 mL of pleural fluid due to fluid being loculated. No pneumothorax on post-procedure chest x-ray. Procedure  performed by: Corrin Parker, PA-C Electronically Signed   By: Gilmer Mor D.O.   On: 10/12/2021 15:14   DG Chest 1 View  Result Date: 10/12/2021 CLINICAL DATA:  Status post right thoracentesis EXAM: CHEST  1 VIEW COMPARISON:  CT 10/11/2021, x-ray 10/11/2021 FINDINGS: Moderate to large loculated right-sided pleural effusion, similar in size to the CT but increased compared to the previous x-ray. Patchy airspace opacity throughout the right lung. Left lung is clear. No left-sided pleural effusion. Heart size is stable. No pneumothorax. IMPRESSION: 1. Moderate to large loculated right-sided pleural effusion, similar in size to the recent CT, but increased compared to previous x-ray. No pneumothorax. 2. Patchy airspace opacity throughout the right lung could represent atelectasis and/or pneumonia. Electronically Signed   By: Duanne Guess D.O.   On: 10/12/2021 09:08   CT Angio Chest PE W and/or Wo Contrast  Result Date: 10/11/2021 CLINICAL DATA:  Chest pain, shortness of breath EXAM: CT ANGIOGRAPHY CHEST WITH CONTRAST TECHNIQUE: Multidetector CT imaging of the chest was performed using the standard protocol during bolus administration of intravenous contrast.  Multiplanar CT image reconstructions and MIPs were obtained to evaluate the vascular anatomy. RADIATION DOSE REDUCTION: This exam was performed according to the departmental dose-optimization program which includes automated exposure control, adjustment of the mA and/or kV according to patient size and/or use of iterative reconstruction technique. CONTRAST:  105mL OMNIPAQUE IOHEXOL 350 MG/ML SOLN COMPARISON:  Previous chest radiographs done earlier today and CT chest done on 06/09/2020 FINDINGS: Cardiovascular: There is homogeneous enhancement in the thoracic aorta. There is ectasia of ascending thoracic aorta measuring 3.7 cm. There are no intraluminal filling defects in the central pulmonary artery branches. Evaluation of small peripheral branches in the lower lung fields is limited by motion artifacts and infiltrates. Mediastinum/Nodes: No significant lymphadenopathy seen. Lungs/Pleura: There is moderate to large right pleural effusion. Part of the effusion appears to be loculated in the periphery of right upper and right mid lung fields. Density measurements in the right pleural effusion are less than 10 Hounsfield units. There is no significant focal soft tissue thickening in the pleural surface. There is compression atelectasis in the right lower lobe. Small patchy alveolar and ground-glass densities seen in the right upper lobe and right middle lobe. Left lung is mostly clear. Subtle ground-glass densities seen in the left lower lung fields without focal consolidation. There is no left pleural effusion. There is no pneumothorax. Upper Abdomen: There is calcified gallbladder stone. There is no dilation of bile ducts. There are small low-density foci in the spleen. Musculoskeletal: No acute findings are seen. Review of the MIP images confirms the above findings. IMPRESSION: There is no evidence of central pulmonary artery embolism. There is no evidence of thoracic aortic dissection. There is moderate to large  right pleural effusion part of which is loculated in the periphery of right upper and right mid lung fields. Possibility of empyema is not excluded. Thoracentesis as clinically warranted should be considered. Compression atelectasis is seen in the right lower lobe. There are patchy alveolar and ground-glass infiltrates in the right upper and right mid lung fields. Faint ground-glass densities seen in the left lower lung fields. Findings suggest possible multifocal pneumonia. Gallbladder stone. Other findings as described in the body of the report. Electronically Signed   By: Ernie Avena M.D.   On: 10/11/2021 20:40        Scheduled Meds:  allopurinol  300 mg Oral Daily   doxycycline  100 mg Oral Q12H  ezetimibe  10 mg Oral Daily   fenofibrate  160 mg Oral Daily   icosapent Ethyl  2 g Oral BID   ipratropium-albuterol  3 mL Nebulization BID   irbesartan  300 mg Oral Daily   methylPREDNISolone (SOLU-MEDROL) injection  80 mg Intravenous Q12H   mometasone-formoterol  2 puff Inhalation BID   montelukast  10 mg Oral QHS   pantoprazole  20 mg Oral Daily   Continuous Infusions:  sodium chloride 10 mL/hr at 10/11/21 2340   cefTRIAXone (ROCEPHIN)  IV 1 g (10/12/21 1705)     LOS: 1 day    Time spent: 45 minutes spent on chart review, discussion with nursing staff, consultants, updating family and interview/physical exam; more than 50% of that time was spent in counseling and/or coordination of care.    Joseph Art, DO [facility] Available via Epic secure chat 7am-7pm After these hours, please refer to coverage provider listed on [email or web address] 10/13/2021, 11:27 AM

## 2021-10-13 NOTE — Interval H&P Note (Signed)
History and Physical Interval Note:  10/13/2021 2:16 PM  Gabriel Wong  has presented today for surgery, with the diagnosis of pleural effusion.  The various methods of treatment have been discussed with the patient and family. After consideration of risks, benefits and other options for treatment, the patient has consented to  Procedure(s) with comments: INSERTION PLEURAL DRAINAGE CATHETER (Right) - 24fr pigtail catheter as a surgical intervention.  The patient's history has been reviewed, patient examined, no change in status, stable for surgery.  I have reviewed the patient's chart and labs.  Questions were answered to the patient's satisfaction.     Rachel Bo Dwayne Bulkley

## 2021-10-14 ENCOUNTER — Inpatient Hospital Stay (HOSPITAL_COMMUNITY): Payer: BC Managed Care – PPO

## 2021-10-14 DIAGNOSIS — I10 Essential (primary) hypertension: Secondary | ICD-10-CM | POA: Diagnosis not present

## 2021-10-14 DIAGNOSIS — E876 Hypokalemia: Secondary | ICD-10-CM | POA: Diagnosis not present

## 2021-10-14 DIAGNOSIS — J189 Pneumonia, unspecified organism: Secondary | ICD-10-CM | POA: Diagnosis not present

## 2021-10-14 DIAGNOSIS — J90 Pleural effusion, not elsewhere classified: Secondary | ICD-10-CM | POA: Diagnosis not present

## 2021-10-14 LAB — BASIC METABOLIC PANEL
Anion gap: 13 (ref 5–15)
BUN: 26 mg/dL — ABNORMAL HIGH (ref 6–20)
CO2: 26 mmol/L (ref 22–32)
Calcium: 9.3 mg/dL (ref 8.9–10.3)
Chloride: 101 mmol/L (ref 98–111)
Creatinine, Ser: 0.8 mg/dL (ref 0.61–1.24)
GFR, Estimated: >60 mL/min (ref >60)
Glucose, Bld: 160 mg/dL — ABNORMAL HIGH (ref 70–99)
Potassium: 4.4 mmol/L (ref 3.5–5.1)
Sodium: 140 mmol/L (ref 135–145)

## 2021-10-14 LAB — TRIGLYCERIDES, BODY FLUIDS: Triglycerides, Fluid: 45 mg/dL

## 2021-10-14 LAB — CBC
HCT: 37 % — ABNORMAL LOW (ref 39.0–52.0)
Hemoglobin: 12.9 g/dL — ABNORMAL LOW (ref 13.0–17.0)
MCH: 35.4 pg — ABNORMAL HIGH (ref 26.0–34.0)
MCHC: 34.9 g/dL (ref 30.0–36.0)
MCV: 101.6 fL — ABNORMAL HIGH (ref 80.0–100.0)
Platelets: 374 10*3/uL (ref 150–400)
RBC: 3.64 MIL/uL — ABNORMAL LOW (ref 4.22–5.81)
RDW: 12.5 % (ref 11.5–15.5)
WBC: 22 10*3/uL — ABNORMAL HIGH (ref 4.0–10.5)
nRBC: 0 % (ref 0.0–0.2)

## 2021-10-14 MED ORDER — SODIUM CHLORIDE 0.9% FLUSH
10.0000 mL | Freq: Three times a day (TID) | INTRAVENOUS | Status: DC
  Administered 2021-10-15 (×2): 10 mL

## 2021-10-14 MED ORDER — SODIUM CHLORIDE 0.9% FLUSH
10.0000 mL | Freq: Three times a day (TID) | INTRAVENOUS | Status: DC
Start: 2021-10-14 — End: 2021-10-14
  Administered 2021-10-14 (×2): 10 mL

## 2021-10-14 MED ORDER — SODIUM CHLORIDE (PF) 0.9 % IJ SOLN
10.0000 mg | Freq: Once | INTRAMUSCULAR | Status: AC
  Administered 2021-10-14: 10 mg via INTRAPLEURAL
  Filled 2021-10-14: qty 10

## 2021-10-14 MED ORDER — STERILE WATER FOR INJECTION IJ SOLN
5.0000 mg | Freq: Once | RESPIRATORY_TRACT | Status: AC
  Administered 2021-10-14: 5 mg via INTRAPLEURAL
  Filled 2021-10-14: qty 5

## 2021-10-14 NOTE — Progress Notes (Signed)
PROGRESS NOTE    Gabriel Wong  [id number] DOB: [birth year] DOA: 10/11/2021 PCP: Silverio Decamp, MD    Brief Narrative:   Gabriel Wong is a 56 y.o. male with medical history significant for moderate persistent asthma, hypertension, hyperlipidemia, allergic rhinitis, who is admitted to [facility] on 10/11/2021 by way of transfer from [facility] ED with community-acquired pneumonia after presenting from home to the latter complaining of shortness of breath. Found to have loculated pleural effusion.  S/p pig tail catheter and possibly lytics.   Assessment and Plan: Sepsis due to CAP (community acquired pneumonia) - Abx: Rocephin and doxycycline, as above.   -attempted thoracentesis but only 150cc removed due to loculation PCCM consult: plan for pig tail catheter- 2+L out -CVTS following: We will reassess later in the week to determine if he needs surgical decortication.   Allergic rhinitis - cont home Flonase.    ? Acute asthma exacerbation -admitted started on Solumedrol 80 mg IV twice daily- will wean to PO and then off after 5 days.  -Scheduled duo nebulizers.  -Continue Singulair.  Hypokalemia/hypomagnesemia -replete   Essential hypertension -resume home meds as able   Generalized anxiety disorder  Continue outpatient as needed Xanax.   Labs pending for today    DVT prophylaxis: SCDs Start: 10/12/21 0131    Code Status: Full Code Family Communication:   Disposition Plan:  Level of care: Med-Surg Status is: Inpatient Remains inpatient appropriate because: has chest tube    Consultants:  CVTS PCCM   Subjective: Feels better with fluid out, still hurts to cough  Objective: Vitals:   10/13/21 1558 10/13/21 1951 10/13/21 2059 10/14/21 0627  BP: 129/82  134/86 131/89  Pulse: 79 79 98 75  Resp: 20 (!) 25 20 17   Temp: 97.7 F (36.5 C)  98.1 F (36.7 C) 98 F (36.7 C)  TempSrc: Oral  Oral Oral  SpO2: 95% 95% 93% 96%  Weight:       Height:        Intake/Output Summary (Last 24 hours) at 10/14/2021 0842 Last data filed at 10/14/2021 [id number] Gross per 24 hour  Intake 120 ml  Output 2660 ml  Net -2540 ml   Filed Weights   10/11/21 1912 10/12/21 0106  Weight: 92.1 kg 92.6 kg    Examination:    General: Appearance:     Overweight male in no acute distress     Lungs:      respirations unlabored  Heart:    Normal heart rate.   MS:   All extremities are intact.   Neurologic:   Awake, alert, oriented x 3. No apparent focal neurological           defect.          Data Reviewed: I have personally reviewed following labs and imaging studies  CBC: Recent Labs  Lab 10/11/21 1910 10/12/21 0246  WBC 19.2* 17.9*  NEUTROABS  --  14.9*  HGB 13.2 12.7*  HCT 38.2* 37.0*  MCV 97.9 99.7  PLT 444* 0000000   Basic Metabolic Panel: Recent Labs  Lab 10/11/21 0000 10/11/21 1910 10/12/21 0246  NA 141 141 136  K 3.8 3.4* 3.2*  CL 98 99 98  CO2 31 28 27   GLUCOSE 91 110* 201*  BUN 19 23* 20  CREATININE 0.86 0.93 0.88  CALCIUM 9.8 9.6 8.6*  MG  --   --  1.6*  PHOS  --   --  2.9   GFR: Estimated Creatinine  Clearance: 107.1 mL/min (by C-G formula based on SCr of 0.88 mg/dL). Liver Function Tests: Recent Labs  Lab 10/11/21 0000 10/12/21 0246  AST 23 22  ALT 27 26  ALKPHOS  --  43  BILITOT 1.2 0.8  PROT 6.5 5.8*  ALBUMIN  --  2.3*   No results for input(s): "LIPASE", "AMYLASE" in the last 168 hours. No results for input(s): "AMMONIA" in the last 168 hours. Coagulation Profile: No results for input(s): "INR", "PROTIME" in the last 168 hours. Cardiac Enzymes: No results for input(s): "CKTOTAL", "CKMB", "CKMBINDEX", "TROPONINI" in the last 168 hours. BNP (last 3 results) No results for input(s): "PROBNP" in the last 8760 hours. HbA1C: No results for input(s): "HGBA1C" in the last 72 hours. CBG: No results for input(s): "GLUCAP" in the last 168 hours. Lipid Profile: No results for input(s): "CHOL", "HDL",  "LDLCALC", "TRIG", "CHOLHDL", "LDLDIRECT" in the last 72 hours. Thyroid Function Tests: No results for input(s): "TSH", "T4TOTAL", "FREET4", "T3FREE", "THYROIDAB" in the last 72 hours. Anemia Panel: No results for input(s): "VITAMINB12", "FOLATE", "FERRITIN", "TIBC", "IRON", "RETICCTPCT" in the last 72 hours. Sepsis Labs: Recent Labs  Lab 10/12/21 0246 10/12/21 0717  PROCALCITON 0.24  --   LATICACIDVEN 1.3 0.8    Recent Results (from the past 240 hour(s))  Blood culture (routine x 2)     Status: None (Preliminary result)   Collection Time: 10/11/21  9:40 PM   Specimen: BLOOD  Result Value Ref Range Status   Specimen Description   Final    BLOOD Performed at [facility], [address], [place], [place] [address]    Special Requests   Final    NONE Performed at [facility], [address] [place], [place] [address]    Culture   Final    NO GROWTH 1 DAY Performed at [facility], [address] [place], [place] [address]    Report Status PENDING  Incomplete  Blood culture (routine x 2)     Status: None (Preliminary result)   Collection Time: 10/11/21  9:54 PM   Specimen: BLOOD RIGHT ARM  Result Value Ref Range Status   Specimen Description BLOOD RIGHT ARM RT AC  Final   Special Requests   Final    BOTTLES DRAWN AEROBIC AND ANAEROBIC Blood Culture adequate volume   Culture   Final    NO GROWTH 1 DAY Performed at [facility], [address], [place], [place] [address]    Report Status PENDING  Incomplete  Gram stain     Status: None   Collection Time: 10/12/21  9:22 AM   Specimen: Lung, Right; Pleural Fluid  Result Value Ref Range Status   Specimen Description FLUID  Final   Special Requests PLEURAL  Final   Gram Stain   Final    FEW WBC PRESENT, PREDOMINANTLY PMN RARE GRAM POSITIVE COCCI IN PAIRS RARE SQUAMOUS EPITHELIAL CELLS PRESENT Gram Stain Report Called to,Read Back By and Verified With:  C/ AMBER C., RN  10/12/21 1142 A. LAFRANCE Performed at [facility], [address] [place], [place] [address]    Report Status 10/12/2021 FINAL  Final  Acid Fast Smear (AFB)     Status: None   Collection Time: 10/12/21  9:22 AM   Specimen: Lung, Right; Pleural Fluid  Result Value Ref Range Status   AFB Specimen Processing Concentration  Final   Acid Fast Smear Negative  Final    Comment: (NOTE) Performed At: BN  [facility] [place], [place] [id number] Rush Farmer MD [id number]    Source (AFB) FLUID  Final    Comment: Performed at [facility], [place] [address] [place], [place] [address]  Culture, body fluid w Gram Stain-bottle     Status: None (Preliminary result)   Collection Time: 10/12/21  9:22 AM   Specimen: Fluid  Result Value Ref Range Status   Specimen Description FLUID  Final   Special Requests PLEURAL RIGHT LUNG  Final   Culture   Final    NO GROWTH 1 DAY Performed at [facility], [place] [address], [place], [place] [address]    Report Status PENDING  Incomplete  Body fluid culture w Gram Stain     Status: None (Preliminary result)   Collection Time: 10/13/21  3:05 PM   Specimen: Pleural Fluid  Result Value Ref Range Status   Specimen Description PLEURAL  Final   Special Requests THORACENTESIS  Final   Gram Stain   Final    NO WBC SEEN NO ORGANISMS SEEN Performed at [facility], [place] [address] [place], [place] [address]    Culture PENDING  Incomplete   Report Status PENDING  Incomplete         Radiology Studies: DG CHEST PORT 1 VIEW  Result Date: 10/13/2021 CLINICAL DATA:  Chest tube placement. EXAM: PORTABLE CHEST 1 VIEW COMPARISON:  Radiograph 10/12/2021 and 10/11/2021.  CT 10/11/2021. FINDINGS: 1505 hours. Interval placement of a small caliber pigtail thoracostomy tube in the right pleural space. The distal coil is incompletely formed. The loculated right pleural effusion has been partially decompressed. There is a small  amount of air laterally in the pleural space. There is mildly improved aeration of the right lung. The heart size and mediastinal contours are stable with mild cardiomegaly. The left lung is clear. There is no mediastinal shift or acute osseous abnormality. Telemetry leads overlie the chest. IMPRESSION: Decreased volume of right pleural effusion and mildly improved right lung aeration following small caliber pigtail thoracostomy tube placement. Small right-sided hydropneumothorax. Electronically Signed   By: Richardean Sale M.D.   On: 10/13/2021 15:11   IR THORACENTESIS ASP PLEURAL SPACE W/IMG GUIDE  Result Date: 10/12/2021 INDICATION: Pneumonia with right pleural effusion. Request for diagnostic and therapeutic thoracentesis. EXAM: ULTRASOUND GUIDED RIGHT THORACENTESIS MEDICATIONS: 1% lidocaine 20 mL COMPLICATIONS: None immediate. PROCEDURE: An ultrasound guided thoracentesis was thoroughly discussed with the patient and questions answered. The benefits, risks, alternatives and complications were also discussed. The patient understands and wishes to proceed with the procedure. Written consent was obtained. Ultrasound was performed to localize and mark an adequate pocket of fluid in the right chest. The area was then prepped and draped in the normal sterile fashion. 1% Lidocaine was used for local anesthesia. Under ultrasound guidance a 6 Fr Safe-T-Centesis catheter was introduced. Thoracentesis was performed. The catheter was removed and a dressing applied. FINDINGS: A total of only 150 mL of hazy yellow fluid was removed due to the fluid being loculated. Samples were sent to the laboratory as requested by the clinical team. IMPRESSION: Successful ultrasound guided right thoracentesis yielding only 150 mL of pleural fluid due to fluid being loculated. No pneumothorax on post-procedure chest x-ray. Procedure performed by: Gareth Eagle, PA-C Electronically Signed   By: Corrie Mckusick D.O.   On: 10/12/2021 15:14    DG Chest 1 View  Result Date: 10/12/2021 CLINICAL DATA:  Status post right thoracentesis EXAM: CHEST  1 VIEW  COMPARISON:  CT 10/11/2021, x-ray 10/11/2021 FINDINGS: Moderate to large loculated right-sided pleural effusion, similar in size to the CT but increased compared to the previous x-ray. Patchy airspace opacity throughout the right lung. Left lung is clear. No left-sided pleural effusion. Heart size is stable. No pneumothorax. IMPRESSION: 1. Moderate to large loculated right-sided pleural effusion, similar in size to the recent CT, but increased compared to previous x-ray. No pneumothorax. 2. Patchy airspace opacity throughout the right lung could represent atelectasis and/or pneumonia. Electronically Signed   By: Duanne Guess D.O.   On: 10/12/2021 09:08        Scheduled Meds:  allopurinol  300 mg Oral Daily   alteplase (CATHFLO ACTIVASE) 10 mg in sodium chloride (PF) 0.9 % 30 mL  10 mg Intrapleural Once   And   dornase alfa (PULMOZYME) 5 mg in sterile water (preservative free) 30 mL  5 mg Intrapleural Once   doxycycline  100 mg Oral Q12H   ezetimibe  10 mg Oral Daily   fenofibrate  160 mg Oral Daily   icosapent Ethyl  2 g Oral BID   ipratropium-albuterol  3 mL Nebulization BID   irbesartan  300 mg Oral Daily   mometasone-formoterol  2 puff Inhalation BID   montelukast  10 mg Oral QHS   pantoprazole  20 mg Oral BID   predniSONE  40 mg Oral Q breakfast   sodium chloride flush  10 mL Other Q8H   sodium chloride flush  10 mL Other Q8H   Continuous Infusions:  sodium chloride Stopped (10/13/21 1900)   cefTRIAXone (ROCEPHIN)  IV Stopped (10/13/21 1804)     LOS: 2 days    Time spent: 45 minutes spent on chart review, discussion with nursing staff, consultants, updating family and interview/physical exam; more than 50% of that time was spent in counseling and/or coordination of care.    Joseph Art, DO [facility] Available via Epic secure chat 7am-7pm After  these hours, please refer to coverage provider listed on [email or web address] 10/14/2021, 8:42 AM

## 2021-10-14 NOTE — Progress Notes (Addendum)
NAME:  Gabriel Wong, MRN:  [id number], DOB:  [birth year], LOS: 2 ADMISSION DATE:  10/11/2021, CONSULTATION DATE:  10/12/21 REFERRING MD:  Benjamine Mola - TRH, CHIEF COMPLAINT:  loculated pleural effusion    History of Present Illness:  56 yo M PMH mild persistent asthma, HTN,  presented to [facility] ED 6/28 with abnormal labs + SOB. Earlier 6/28 he was seen at PCP for persistent SOB. Sounds like he has had a persistent cough + SOB for a couple of weeks for which he had already completed a zpack and decadron course but hasnt had lasting improvement. Associated fevers and some chest pain. Ddimer was ordered by PCP 6/28 in this setting and when this resulted elevated (1.20), he was referred to ED. CTA obtained in ED, without evidence of PE but showed R sided effusion and multifocal PNA.  Patient was started on abx and admitted to [facility] 6/29. Underwent thora with IR 6/29, but fluid was found to be loculated and only yielded.   PCCM is consulted   Pertinent  Medical History  HTN Mild persistent asthma  Significant Hospital Events: Including procedures, antibiotic start and stop dates in addition to other pertinent events   6/28 [facility] ED per PCP referral after being seen by PCP for SOB. In ED, CTA without PE. Did have R pleural effusion and R multifocal PNA 6/29 admit to [facility]. IR Thora -- off, loculated. PCCM consult  Chest tube placed by Drr Icard6/30/2023 Lytics placed 10/13/2021 Second dose of lytics placed 10/14/2021  Underwent IR thoracentesis 6/29, only yellow hazy fluid off due to fluid loculation.   PCCM consulted Objective   Blood pressure 124/78, pulse 83, temperature 98.1 F (36.7 C), temperature source Oral, resp. rate 16, height 5\' 10"  (1.778 m), weight 92.6 kg, SpO2 99 %.        Intake/Output Summary (Last 24 hours) at 10/14/2021 0906 Last data filed at 10/14/2021 12/15/2021 Gross per 24 hour  Intake 120 ml  Output 2660 ml  Net -2540 ml    Filed Weights    10/11/21 1912 10/12/21 0106  Weight: 92.1 kg 92.6 kg    Examination: General: Well-nourished well-developed male no acute distress HEENT: MM pink/moist Neuro: Grossly intact.  No defect CV: Heart sounds are regular PULM: Chest tube drainage system which has been replaced currently with 170 cc GI: soft, bsx4 active  GU: Voids Extremities: warm/dry,  edema  Skin: no rashes or lesions   Resolved Hospital Problem list     Assessment & Plan:   Right sided pleural effusion Right sided multifocal CAP Sepsis due to above process Acute asthma exacerbation Tobacco use disorder -Thora with IR 6/29, 7/29 yellow hazy fluid off and noted have loculated fluid. Thora labs pending. But clinically, high suspicion for parapneumonic effusion Pigtail catheter placed on 10/13/2021 by Dr. 10/15/2021 10/14/2021 2000 cc of fluid drained after first administration of lytics With repeat dose of lytics on 10/14/2021  P Notes repeated on 10/14/2021 11 Serial chest x-rays Continue antimicrobial therapy Continue steroids bronchodilators Needs to stop smoking Serial chest x-ray   12/15/2021 Minor ACNP Acute Care Nurse Practitioner Brett [facility] Pulmonary/Critical Care Please consult Amion 10/14/2021, 9:09 AM   PCCM:  56 yo M, alcohol use, tobacco use, likely cap treated op, complicated by parapneumonic effusion   BP (!) 145/87 (BP Location: Left Arm)   Pulse 85   Temp 98.1 F (36.7 C) (Oral)   Resp 19   Ht 5\' 10"  (  1.778 m)   Wt 92.6 kg   SpO2 96%   BMI 29.29 kg/m   Gen: mid aged male, resting in bed no distress Hent: tracking, ncat  Lungs: diminished in the right base, no crackles  Abd: soft nt nd   Labs: reviewed   A:  Complicated Parapneumonic Effusion, Right  S/p pigtail placement  P: Intrapleural lytics today  Repeat CXR in AM  Likely repeat lytics tomorrow. He has had a good response. Another 500 out just today and 2L yesterday  Can hopefully avoid surgical need   Josephine Igo,  DO [place] [facility] 10/14/2021 1:01 PM

## 2021-10-14 NOTE — Procedures (Signed)
Pleural fibrinolytic administration procedure note [name] is [id number] Date of birth [birth year] 7 1 2021-12-13 Provider Brett Canales Ambra Haverstick, ACNP Procedure pleural fibrinolysis subsequent day Indications continue fibrinolysis of complicated pleural effusion Consent was obtained no anesthesia was required patient's identification was verified Procedure utilizing existing catheter after being cleaned in sterile manner.  10 mg of tPA and 30 cc of saline and 5 mg of dornase and 30 cc of sterile water was injected.  Catheter was clamped for 1 hour until be reopened by the nurse.  Brett Canales Mellisa Arshad ACNP Acute Care Nurse Practitioner [facility] Pulmonary/Critical Care Please consult Amion 10/14/2021, 9:24 AM

## 2021-10-15 ENCOUNTER — Encounter (HOSPITAL_COMMUNITY): Payer: Self-pay | Admitting: Pulmonary Disease

## 2021-10-15 ENCOUNTER — Inpatient Hospital Stay (HOSPITAL_COMMUNITY): Payer: BC Managed Care – PPO

## 2021-10-15 DIAGNOSIS — J189 Pneumonia, unspecified organism: Secondary | ICD-10-CM | POA: Diagnosis not present

## 2021-10-15 DIAGNOSIS — E876 Hypokalemia: Secondary | ICD-10-CM | POA: Diagnosis not present

## 2021-10-15 DIAGNOSIS — I10 Essential (primary) hypertension: Secondary | ICD-10-CM | POA: Diagnosis not present

## 2021-10-15 DIAGNOSIS — J90 Pleural effusion, not elsewhere classified: Secondary | ICD-10-CM | POA: Diagnosis not present

## 2021-10-15 MED ORDER — SODIUM CHLORIDE (PF) 0.9 % IJ SOLN
10.0000 mg | Freq: Once | INTRAMUSCULAR | Status: AC
  Administered 2021-10-15: 10 mg via INTRAPLEURAL
  Filled 2021-10-15: qty 10

## 2021-10-15 MED ORDER — SODIUM CHLORIDE 0.9% FLUSH
10.0000 mL | Freq: Three times a day (TID) | INTRAVENOUS | Status: DC
  Administered 2021-10-15 – 2021-10-16 (×3): 10 mL

## 2021-10-15 MED ORDER — STERILE WATER FOR INJECTION IJ SOLN
5.0000 mg | Freq: Once | RESPIRATORY_TRACT | Status: AC
  Administered 2021-10-15: 5 mg via INTRAPLEURAL
  Filled 2021-10-15: qty 5

## 2021-10-15 NOTE — Procedures (Addendum)
Pleural fibrinolytic administration procedure note Gabriel Wong is [id number] Date of birth 01-21-1966 10/15/21  0945  Provider Gabriel Wong Gabriel Wong, ACNP Procedure pleural fibrinolysis subsequent day Indications continue fibrinolysis of complicated pleural effusion Consent was obtained no anesthesia was required patient's identification was verified Procedure utilizing existing catheter after being cleaned in sterile manner.  10 mg of tPA and 30 cc of saline and 5 mg of dornase and 30 cc of sterile water was injected.  Catheter was clamped for 1 hour until be reopened by the nurse.    Gabriel Wong Gabriel Wong ACNP Acute Care Nurse Practitioner [facility] Pulmonary/Critical Care Please consult Amion 10/15/2021, 9:49 AM

## 2021-10-15 NOTE — Progress Notes (Signed)
PROGRESS NOTE    Gabriel Wong  [id number] DOB: 14-Jul-1965 DOA: 10/11/2021 PCP: Silverio Decamp, MD    Brief Narrative:   Gabriel Wong is a 56 y.o. male with medical history significant for moderate persistent asthma, hypertension, hyperlipidemia, allergic rhinitis, who is admitted to [facility] on 10/11/2021 by way of transfer from [facility] ED with community-acquired pneumonia after presenting from home to the latter complaining of shortness of breath. Found to have loculated pleural effusion.  S/p pig tail catheter and lytics.   Assessment and Plan: Sepsis due to CAP (community acquired pneumonia) - Abx: Rocephin and doxycycline, as above.   -attempted thoracentesis but only 150cc removed due to loculation PCCM consult: plan for pig tail catheter plus lytics -CVTS following: We will reassess later in the week to determine if he needs surgical decortication.   Allergic rhinitis - cont home Flonase.    ? Acute asthma exacerbation -admitted started on Solumedrol 80 mg IV twice daily- will wean to PO and then off after 5 days.  -Scheduled duo nebulizers.  -Continue Singulair. -does not plan to ever stop smoking (says he only does occasionally)  Hypokalemia/hypomagnesemia -replete  Essential hypertension -resume home meds as able   Generalized anxiety disorder  Continue outpatient as needed Xanax.       DVT prophylaxis: SCDs Start: 10/12/21 0131    Code Status: Full Code Family Communication:   Disposition Plan:  Level of care: Med-Surg Status is: Inpatient Remains inpatient appropriate because: has chest tube    Consultants:  CVTS PCCM   Subjective: No overnight events Hopes to avoid surgery  Objective: Vitals:   10/14/21 2047 10/14/21 2356 10/15/21 0428 10/15/21 0740  BP: (!) 145/87 126/82 120/75 (!) 135/92  Pulse: 77 64 73 69  Resp: 16 16 15 16   Temp: 97.7 F (36.5 C) 97.7 F (36.5 C) 98 F (36.7 C) 97.9 F (36.6 C)   TempSrc: Oral Oral Oral Oral  SpO2: 96% 94% 97% 95%  Weight:      Height:        Intake/Output Summary (Last 24 hours) at 10/15/2021 0834 Last data filed at 10/15/2021 0429 Gross per 24 hour  Intake 110 ml  Output 730 ml  Net -620 ml   Filed Weights   10/11/21 1912 10/12/21 0106  Weight: 92.1 kg 92.6 kg    Examination:    General: Appearance:     Overweight male in no acute distress     Lungs:      respirations unlabored, able to speak in complete sentences  Heart:    Normal heart rate.   MS:   All extremities are intact.   Neurologic:   Awake, alert, oriented x 3. No apparent focal neurological           defect.            Data Reviewed: I have personally reviewed following labs and imaging studies  CBC: Recent Labs  Lab 10/11/21 1910 10/12/21 0246 10/14/21 1024  WBC 19.2* 17.9* 22.0*  NEUTROABS  --  14.9*  --   HGB 13.2 12.7* 12.9*  HCT 38.2* 37.0* 37.0*  MCV 97.9 99.7 101.6*  PLT 444* 370 [id number]   Basic Metabolic Panel: Recent Labs  Lab 10/11/21 0000 10/11/21 1910 10/12/21 0246 10/14/21 1024  NA 141 141 136 140  K 3.8 3.4* 3.2* 4.4  CL 98 99 98 101  CO2 31 28 27 26   GLUCOSE 91 110* 201* 160*  BUN 19 23*  20 26*  CREATININE 0.86 0.93 0.88 0.80  CALCIUM 9.8 9.6 8.6* 9.3  MG  --   --  1.6*  --   PHOS  --   --  2.9  --    GFR: Estimated Creatinine Clearance: 117.8 mL/min (by C-G formula based on SCr of 0.8 mg/dL). Liver Function Tests: Recent Labs  Lab 10/11/21 0000 10/12/21 0246  AST 23 22  ALT 27 26  ALKPHOS  --  43  BILITOT 1.2 0.8  PROT 6.5 5.8*  ALBUMIN  --  2.3*   No results for input(s): "LIPASE", "AMYLASE" in the last 168 hours. No results for input(s): "AMMONIA" in the last 168 hours. Coagulation Profile: No results for input(s): "INR", "PROTIME" in the last 168 hours. Cardiac Enzymes: No results for input(s): "CKTOTAL", "CKMB", "CKMBINDEX", "TROPONINI" in the last 168 hours. BNP (last 3 results) No results for input(s):  "PROBNP" in the last 8760 hours. HbA1C: No results for input(s): "HGBA1C" in the last 72 hours. CBG: No results for input(s): "GLUCAP" in the last 168 hours. Lipid Profile: No results for input(s): "CHOL", "HDL", "LDLCALC", "TRIG", "CHOLHDL", "LDLDIRECT" in the last 72 hours. Thyroid Function Tests: No results for input(s): "TSH", "T4TOTAL", "FREET4", "T3FREE", "THYROIDAB" in the last 72 hours. Anemia Panel: No results for input(s): "VITAMINB12", "FOLATE", "FERRITIN", "TIBC", "IRON", "RETICCTPCT" in the last 72 hours. Sepsis Labs: Recent Labs  Lab 10/12/21 0246 10/12/21 0717  PROCALCITON 0.24  --   LATICACIDVEN 1.3 0.8    Recent Results (from the past 240 hour(s))  Blood culture (routine x 2)     Status: None (Preliminary result)   Collection Time: 10/11/21  9:40 PM   Specimen: BLOOD  Result Value Ref Range Status   Specimen Description   Final    BLOOD Performed at [facility], [address] [place], [place] [address]    Special Requests   Final    NONE Performed at [facility], [address] [place], [place] [address]    Culture   Final    NO GROWTH 3 DAYS Performed at [facility], [address] [place], [place] [address]    Report Status PENDING  Incomplete  Blood culture (routine x 2)     Status: None (Preliminary result)   Collection Time: 10/11/21  9:54 PM   Specimen: BLOOD RIGHT ARM  Result Value Ref Range Status   Specimen Description BLOOD RIGHT ARM RT AC  Final   Special Requests   Final    BOTTLES DRAWN AEROBIC AND ANAEROBIC Blood Culture adequate volume   Culture   Final    NO GROWTH 3 DAYS Performed at [facility], [address] [place], [place] [address]    Report Status PENDING  Incomplete  Gram stain     Status: None   Collection Time: 10/12/21  9:22 AM   Specimen: Lung, Right; Pleural Fluid  Result Value Ref Range Status   Specimen Description FLUID  Final   Special Requests PLEURAL   Final   Gram Stain   Final    FEW WBC PRESENT, PREDOMINANTLY PMN RARE GRAM POSITIVE COCCI IN PAIRS RARE SQUAMOUS EPITHELIAL CELLS PRESENT Gram Stain Report Called to,Read Back By and Verified With:  C/ AMBER C., RN 10/12/21 1142 A. LAFRANCE Performed at [facility], [address] [place], [place] [address]    Report Status 10/12/2021 FINAL  Final  Acid Fast Smear (AFB)     Status: None  Collection Time: 10/12/21  9:22 AM   Specimen: Lung, Right; Pleural Fluid  Result Value Ref Range Status   AFB Specimen Processing Concentration  Final   Acid Fast Smear Negative  Final    Comment: (NOTE) Performed At: [facility] [place], [place] [id number] Rush Farmer MD [id number]    Source (AFB) FLUID  Final    Comment: Performed at [facility], [place] [address], [place], [place] [address]  Culture, body fluid w Gram Stain-bottle     Status: None (Preliminary result)   Collection Time: 10/12/21  9:22 AM   Specimen: Fluid  Result Value Ref Range Status   Specimen Description FLUID  Final   Special Requests PLEURAL RIGHT LUNG  Final   Culture   Final    NO GROWTH 3 DAYS Performed at [place] [address], [place], [place] [address]    Report Status PENDING  Incomplete  Body fluid culture w Gram Stain     Status: None (Preliminary result)   Collection Time: 10/13/21  3:05 PM   Specimen: Pleural Fluid  Result Value Ref Range Status   Specimen Description PLEURAL  Final   Special Requests THORACENTESIS  Final   Gram Stain NO WBC SEEN NO ORGANISMS SEEN   Final   Culture   Final    NO GROWTH < 24 HOURS Performed at [facility], [place] [address] [place], [place] [address]    Report Status PENDING  Incomplete         Radiology Studies: DG CHEST PORT 1 VIEW  Result Date: 10/14/2021 CLINICAL DATA:  Pleural effusion EXAM: PORTABLE CHEST - 1 VIEW COMPARISON:  10/13/2021 FINDINGS: Cardiomediastinal silhouette and pulmonary  vasculature are within normal limits. Right lateral chest tube is slightly retracted compared to prior exam. No pneumothorax is identified. Minimal right pleural effusion still remains. Unchanged elevation of right hemidiaphragm. Left lung remains well aerated. IMPRESSION: Right lateral chest tube is slightly retracted compared to prior examination. Right pleural effusion is improved with minimal fluid still remaining. No pneumothorax. Electronically Signed   By: Miachel Roux M.D.   On: 10/14/2021 09:21   DG CHEST PORT 1 VIEW  Result Date: 10/13/2021 CLINICAL DATA:  Chest tube placement. EXAM: PORTABLE CHEST 1 VIEW COMPARISON:  Radiograph 10/12/2021 and 10/11/2021.  CT 10/11/2021. FINDINGS: 1505 hours. Interval placement of a small caliber pigtail thoracostomy tube in the right pleural space. The distal coil is incompletely formed. The loculated right pleural effusion has been partially decompressed. There is a small amount of air laterally in the pleural space. There is mildly improved aeration of the right lung. The heart size and mediastinal contours are stable with mild cardiomegaly. The left lung is clear. There is no mediastinal shift or acute osseous abnormality. Telemetry leads overlie the chest. IMPRESSION: Decreased volume of right pleural effusion and mildly improved right lung aeration following small caliber pigtail thoracostomy tube placement. Small right-sided hydropneumothorax. Electronically Signed   By: Richardean Sale M.D.   On: 10/13/2021 15:11        Scheduled Meds:  allopurinol  300 mg Oral Daily   doxycycline  100 mg Oral Q12H   ezetimibe  10 mg Oral Daily   fenofibrate  160 mg Oral Daily   icosapent Ethyl  2 g Oral BID   ipratropium-albuterol  3 mL Nebulization BID   irbesartan  300 mg Oral Daily   mometasone-formoterol  2 puff Inhalation BID   montelukast  10 mg Oral QHS   pantoprazole  20 mg Oral BID   predniSONE  40 mg Oral Q breakfast   sodium chloride flush  10 mL  Other Q8H   Continuous Infusions:  sodium chloride Stopped (10/13/21 1900)   cefTRIAXone (ROCEPHIN)  IV Stopped (10/14/21 1643)     LOS: 3 days    Time spent: 45 minutes spent on chart review, discussion with nursing staff, consultants, updating family and interview/physical exam; more than 50% of that time was spent in counseling and/or coordination of care.    Joseph Art, DO [facility] Available via Epic secure chat 7am-7pm After these hours, please refer to coverage provider listed on [email or web address] 10/15/2021, 8:34 AM

## 2021-10-15 NOTE — Progress Notes (Signed)
   NAME:  Gabriel Wong, MRN:  [id number], DOB:  1965/04/18, LOS: 3 ADMISSION DATE:  10/11/2021, CONSULTATION DATE:  10/12/21 REFERRING MD:  Benjamine Mola - TRH, CHIEF COMPLAINT:  loculated pleural effusion    History of Present Illness:  56 yo M PMH mild persistent asthma, HTN,  presented to [facility] ED 6/28 with abnormal labs + SOB. Earlier 6/28 he was seen at PCP for persistent SOB. Sounds like he has had a persistent cough + SOB for a couple of weeks for which he had already completed a zpack and decadron course but hasnt had lasting improvement. Associated fevers and some chest pain. Ddimer was ordered by PCP 6/28 in this setting and when this resulted elevated (1.20), he was referred to ED. CTA obtained in ED, without evidence of PE but showed R sided effusion and multifocal PNA.  Patient was started on abx and admitted to [facility] 6/29. Underwent thora with IR 6/29, but fluid was found to be loculated and only yielded.   PCCM is consulted   Pertinent  Medical History  HTN Mild persistent asthma  Significant Hospital Events: Including procedures, antibiotic start and stop dates in addition to other pertinent events   6/28 [facility] ED per PCP referral after being seen by PCP for SOB. In ED, CTA without PE. Did have R pleural effusion and R multifocal PNA 6/29 admit to [facility]. IR Thora -- off, loculated. PCCM consult  Chest tube placed by Drr Icard6/30/2023 Lytics placed 10/13/2021 Second dose of lytics placed 10/14/2021 Third dose of lytics instilled 10/15/2021  Underwent IR thoracentesis 6/29, only yellow hazy fluid off due to fluid loculation.   PCCM consulted Objective   Blood pressure (!) 135/92, pulse 69, temperature 97.9 F (36.6 C), temperature source Oral, resp. rate 16, height 5\' 10"  (1.778 m), weight 92.6 kg, SpO2 95 %.        Intake/Output Summary (Last 24 hours) at 10/15/2021 0943 Last data filed at 10/15/2021 0429 Gross per 24 hour  Intake 110 ml  Output 730  ml  Net -620 ml   Filed Weights   10/11/21 1912 10/12/21 0106  Weight: 92.1 kg 92.6 kg    Examination: General: Well nourished well-developed male no acute distress HEENT: MM pink/moist Neuro: Grossly intact CV: Heart sounds are regular PULM: Diminished in the bases  GI: soft, bsx4 active  GU: Extr negative emities: warm/dry,  edema  Skin: no rashes or lesions    Resolved Hospital Problem list     Assessment & Plan:   Right sided pleural effusion Right sided multifocal CAP Sepsis due to above process Acute asthma exacerbation Tobacco use disorder -Thora with IR 6/29, 7/29 yellow hazy fluid off and noted have loculated fluid. Thora labs pending. But clinically, high suspicion for parapneumonic effusion Pigtail catheter placed on 10/13/2021 by Dr. 10/15/2021 10/14/2021 2000 cc of fluid drained after first administration of lytics With repeat dose of lytics on 10/14/2021 was additional 680 cc of drainage.  We will give a third and final dose of lytics on 10/15/2021.  P Lytics will be repeated on 10/15/2021 Serial chest x-ray   12/16/2021 Wane Mollett ACNP Acute Care Nurse Practitioner [facility] Please consult Amion 10/15/2021, 9:43 AM

## 2021-10-16 ENCOUNTER — Inpatient Hospital Stay (HOSPITAL_COMMUNITY): Payer: BC Managed Care – PPO

## 2021-10-16 ENCOUNTER — Other Ambulatory Visit (HOSPITAL_COMMUNITY): Payer: Self-pay

## 2021-10-16 DIAGNOSIS — J189 Pneumonia, unspecified organism: Secondary | ICD-10-CM | POA: Diagnosis not present

## 2021-10-16 LAB — BODY FLUID CULTURE W GRAM STAIN
Culture: NO GROWTH
Gram Stain: NONE SEEN

## 2021-10-16 LAB — CYTOLOGY - NON PAP

## 2021-10-16 MED ORDER — AMOXICILLIN-POT CLAVULANATE 875-125 MG PO TABS
1.0000 | ORAL_TABLET | Freq: Two times a day (BID) | ORAL | Status: DC
  Administered 2021-10-16 (×2): 1 via ORAL
  Filled 2021-10-16 (×2): qty 1

## 2021-10-16 MED ORDER — AMOXICILLIN-POT CLAVULANATE 875-125 MG PO TABS
1.0000 | ORAL_TABLET | Freq: Two times a day (BID) | ORAL | 0 refills | Status: DC
  Filled 2021-10-16: qty 28, 14d supply, fill #0

## 2021-10-16 MED ORDER — OXYCODONE HCL 5 MG PO TABS
5.0000 mg | ORAL_TABLET | ORAL | 0 refills | Status: DC | PRN
  Filled 2021-10-16: qty 15, 3d supply, fill #0

## 2021-10-16 NOTE — Procedures (Signed)
Removal of 14 French Pigtail Right Pleural Catheter Indication: Resolved pleural effusion   Pt was positioned on his left side and chest tube dressing was removed. Pleural tube was cleaned with alcohol at insertion site. Sutures were removed  and chest tube was pulled without incident. Vaseline Gauze was placed over the site immediately, and occlusive Tegaderm was applied over the site and gauze. Occlusive dressing is intact. Pt. Tolerated the procedure well. Chest tube and [place] were disposed of in a red bag by nursing staff who assisted in the procedure.

## 2021-10-16 NOTE — Progress Notes (Signed)
10/16/2021   I have seen and evaluated the patient for loculated effusion.  S:  No events.  Some pleurisy with coughing.  O: Blood pressure (!) 147/94, pulse 74, temperature 97.9 F (36.6 C), temperature source Oral, resp. rate 16, height 5\' 10"  (1.778 m), weight 92.6 kg, SpO2 95 %.  No distress Lungs clear R chest tube with no air leak or tidaling  CXR looks much better No new labs  A:  R parapneumonic effusion loculated improved with chest tube + lytics  P:  - CT chest today, if no sign fluid pockets remaining will DC chest tube - Will need about 2 weeks of abx after chest tube is out. - Final recs pending repeat chest CT  MD [place] [facility] Prefer epic messenger for cross cover needs If after hours, please call E-link

## 2021-10-16 NOTE — Progress Notes (Signed)
PROGRESS NOTE    Gabriel Wong  NWG:[id number] DOB: May 07, 1965 DOA: 10/11/2021 PCP: Monica Bectonhekkekandam, Thomas J, MD    Brief Narrative:   Gabriel Wong is a 56 y.o. male with medical history significant for moderate persistent asthma, hypertension, hyperlipidemia, allergic rhinitis, who is [facility]11[facility]21 by way of [facility] from [facility] with community-acquired pneumonia after presenting from home to the latter complaining of shortness of breath. Found to have loculated pleural effusion.  S/p pig tail catheter and lytics.  Chest tube removed on 7/3.  Plan to d/c in AM   Assessment and Plan: Sepsis due to CAP (community acquired pneumonia) - Abx: Rocephin and doxycycline, as above.   -attempted thoracentesis but only 150cc removed due to loculation PCCM consult: plan for pig tail catheter plus lytics -no need for CVTS intervention -chest tube removed on 7/3 -2 weeks of augmentin -d/c in AM  Allergic rhinitis - cont home Flonase.   ? Acute asthma exacerbation -admitted started on Solumedrol 80 mg IV twice daily- will wean to PO and then off after 5 days.  -Scheduled duo nebulizers.  -Continue Singulair. -does not plan to ever stop smoking (says he only does occasionally)  Hypokalemia/hypomagnesemia -replete  Essential hypertension -resume home meds as able   Generalized anxiety disorder  Continue outpatient as needed Xanax.       DVT prophylaxis: SCDs Start: 10/12/21 0131    Code Status: Full Code Family Communication:   Disposition Plan:  Level of care: Med-Surg Status is: Inpatient Remains inpatient appropriate because: chest tube pulled, home in AM    Consultants:  CVTS PCCM   Subjective: tired  Objective: Vitals:   10/15/21 1642 10/15/21 1934 10/16/21 0600 10/16/21 0717  BP: (!) 145/86 (!) 141/89 125/86 (!) 147/94  Pulse: 79 87 63 74  Resp: 19 18 16 16   Temp: 97.9 F (36.6 C) 98.1 F (36.7 C) 97.8 F (36.6 C) 97.9  F (36.6 C)  TempSrc: Oral Oral Oral Oral  SpO2: 98% 94% 95% 95%  Weight:      Height:        Intake/Output Summary (Last 24 hours) at 10/16/2021 1217 Last data filed at 10/16/2021 0600 Gross per 24 hour  Intake 350 ml  Output 320 ml  Net 30 ml   Filed Weights   10/11/21 1912 10/12/21 0106  Weight: 92.1 kg 92.6 kg    Examination:    General: Appearance:     Overweight male in no acute distress     Lungs:      respirations unlabored  Heart:    Normal heart rate.   MS:   All extremities are intact.   Neurologic:   Awake, alert, oriented x 3. No apparent focal neurological           defect.          Data Reviewed: I have personally reviewed following labs and imaging studies  CBC: Recent Labs  Lab 10/11/21 1910 10/12/21 0246 10/14/21 1024  WBC 19.2* 17.9* 22.0*  NEUTROABS  --  14.9*  --   HGB 13.2 12.7* 12.9*  HCT 38.2* 37.0* 37.0*  MCV 97.9 99.7 101.6*  PLT 444* 370 374   Basic Metabolic Panel: Recent Labs  Lab 10/11/21 0000 10/11/21 1910 10/12/21 0246 10/14/21 1024  NA 141 141 136 140  K 3.8 3.4* 3.2* 4.4  CL 98 99 98 101  CO2 31 28 27 26   GLUCOSE 91 110* 201* 160*  BUN 19  23* 20 26*  CREATININE 0.86 0.93 0.88 0.80  CALCIUM 9.8 9.6 8.6* 9.3  MG  --   --  1.6*  --   PHOS  --   --  2.9  --    GFR: Estimated Creatinine Clearance: 117.8 mL/min (by C-G formula based on SCr of 0.8 mg/dL). Liver Function Tests: Recent Labs  Lab 10/11/21 0000 10/12/21 0246  AST 23 22  ALT 27 26  ALKPHOS  --  43  BILITOT 1.2 0.8  PROT 6.5 5.8*  ALBUMIN  --  2.3*   No results for input(s): "LIPASE", "AMYLASE" in the last 168 hours. No results for input(s): "AMMONIA" in the last 168 hours. Coagulation Profile: No results for input(s): "INR", "PROTIME" in the last 168 hours. Cardiac Enzymes: No results for input(s): "CKTOTAL", "CKMB", "CKMBINDEX", "TROPONINI" in the last 168 hours. BNP (last 3 results) No results for input(s): "PROBNP" in the last 8760  hours. HbA1C: No results for input(s): "HGBA1C" in the last 72 hours. CBG: No results for input(s): "GLUCAP" in the last 168 hours. Lipid Profile: No results for input(s): "CHOL", "HDL", "LDLCALC", "TRIG", "CHOLHDL", "LDLDIRECT" in the last 72 hours. Thyroid Function Tests: No results for input(s): "TSH", "T4TOTAL", "FREET4", "T3FREE", "THYROIDAB" in the last 72 hours. Anemia Panel: No results for input(s): "VITAMINB12", "FOLATE", "FERRITIN", "TIBC", "IRON", "RETICCTPCT" in the last 72 hours. Sepsis Labs: Recent Labs  Lab 10/12/21 0246 10/12/21 0717  PROCALCITON 0.24  --   LATICACIDVEN 1.3 0.8    Recent Results (from the past 240 hour(s))  Blood culture (routine x 2)     Status: None (Preliminary result)   Collection Time: 10/11/21  9:40 PM   Specimen: BLOOD  Result Value Ref Range Status   Specimen Description   Final    BLOOD Performed at [facility], [address], [place], [place] [address]    Special Requests   Final    NONE Performed at [facility], [address], [place], [place] [address]    Culture   Final    NO GROWTH 3 DAYS Performed at [facility], [address] [place], [place] [address]    Report Status PENDING  Incomplete  Blood culture (routine x 2)     Status: None (Preliminary result)   Collection Time: 10/11/21  9:54 PM   Specimen: BLOOD RIGHT ARM  Result Value Ref Range Status   Specimen Description BLOOD RIGHT ARM RT AC  Final   Special Requests   Final    BOTTLES DRAWN AEROBIC AND ANAEROBIC Blood Culture adequate volume   Culture   Final    NO GROWTH 3 DAYS Performed at [facility], [address] [place], [place] [address]    Report Status PENDING  Incomplete  Gram stain     Status: None   Collection Time: 10/12/21  9:22 AM   Specimen: Lung, Right; Pleural Fluid  Result Value Ref Range Status   Specimen Description FLUID  Final   Special Requests PLEURAL  Final   Gram Stain    Final    FEW WBC PRESENT, PREDOMINANTLY PMN RARE GRAM POSITIVE COCCI IN PAIRS RARE SQUAMOUS EPITHELIAL CELLS PRESENT Gram Stain Report Called to,Read Back By and Verified With:  C/ AMBER C., RN 10/12/21 1142 A. LAFRANCE Performed at [facility], [address] [place], [place] [address]    Report Status 10/12/2021 FINAL  Final  Fungus Culture With Stain     Status: None (Preliminary  result)   Collection Time: 10/12/21  9:22 AM   Specimen: Lung, Right; Pleural Fluid  Result Value Ref Range Status   Fungus Stain Final report  Final    Comment: (NOTE) Performed At: [facility] [address] [place], [place] [id number] Jolene Schimke MD [id number]:[id number]    Fungus (Mycology) Culture PENDING  Incomplete   Fungal Source FLUID  Final    Comment: Performed at [facility], [address] [place], [place] [address]  Acid Fast Smear (AFB)     Status: None   Collection Time: 10/12/21  9:22 AM   Specimen: Lung, Right; Pleural Fluid  Result Value Ref Range Status   AFB Specimen Processing Concentration  Final   Acid Fast Smear Negative  Final    Comment: (NOTE) Performed At: [facility] [address] [place], [place] [id number] Jolene Schimke MD YH:[id number]    Source (AFB) FLUID  Final    Comment: Performed at [facility], [address] [place], [place] [address]  Culture, body fluid w Gram Stain-bottle     Status: None (Preliminary result)   Collection Time: 10/12/21  9:22 AM   Specimen: Fluid  Result Value Ref Range Status   Specimen Description FLUID  Final   Special Requests PLEURAL RIGHT LUNG  Final   Culture   Final    NO GROWTH 3 DAYS Performed at [facility], [address] [place], [place] [address]    Report Status PENDING  Incomplete  Fungus Culture Result     Status: None   Collection Time: 10/12/21  9:22 AM  Result Value Ref Range Status   Result 1 Comment  Final    Comment: (NOTE) KOH/Calcofluor preparation:  no  fungus observed. Performed At: [facility] [address] [place], [place] [id number] Jolene Schimke MD IO:[id number]   Body fluid culture w Gram Stain     Status: None   Collection Time: 10/13/21  3:05 PM   Specimen: Pleural Fluid  Result Value Ref Range Status   Specimen Description PLEURAL  Final   Special Requests THORACENTESIS  Final   Gram Stain NO WBC SEEN NO ORGANISMS SEEN   Final   Culture   Final    NO GROWTH Performed at [facility], [address] [place], [place] [address]    Report Status 10/16/2021 FINAL  Final         Radiology Studies: CT CHEST WO CONTRAST  Result Date: 10/16/2021 CLINICAL DATA:  Follow-up of empyema EXAM: CT CHEST WITHOUT CONTRAST TECHNIQUE: Multidetector CT imaging of the chest was performed following the standard protocol without IV contrast. RADIATION DOSE REDUCTION: This exam was performed according to the departmental dose-optimization program which includes automated exposure control, adjustment of the mA and/or kV according to patient size and/or use of iterative reconstruction technique. COMPARISON:  10/16/2021 chest radiograph.  CTA chest 09/21/2021 FINDINGS: Cardiovascular: Bovine arch. Aortic atherosclerosis. Borderline cardiomegaly. Lad coronary artery calcification on 32/3. Mediastinum/Nodes: No mediastinal or definite hilar adenopathy, given limitations of unenhanced CT. Lungs/Pleura: Placement of a right-sided pleural pigtail catheter since the prior CT. Significant decrease in now trace pleural fluid with areas of pleural thickening. No pneumothorax. Areas of right lower and less so right upper subsegmental atelectasis. No residual pneumonia. A nodule along the right minor fissure of 4 mm on 76/8 is consistent with a subpleural lymph node. Mild centrilobular emphysema.  Clear left lung. Upper Abdomen: 7 mm stone in the gallbladder neck. Normal  imaged portions of the liver, spleen, stomach, adrenal glands, kidneys. Centered  between the body of the stomach and the body of the pancreas is a 2.2 x 2.1 cm cystic lesion on 58/3. This measured 2.1 by 1.8 cm on image 54/2 of lung cancer screening CT on 06/09/2020. Musculoskeletal: No acute osseous abnormality. IMPRESSION: 1. Significantly improved appearance of the chest, with near-complete resolution of right-sided pleural fluid after drain placement. Scattered areas of right-sided atelectasis, but no residual pneumonia. 2. Cystic lesion positioned between the pancreas and stomach is most likely exophytic off the pancreatic body and could represent a pseudocyst or indolent cystic neoplasm. Recommend nonemergent, outpatient dedicated pre and post contrast abdominal MRI/MRCP. Less preferred would be pancreatic protocol pre and post contrast abdominal CT. 3. Aortic atherosclerosis (ICD10-I70.0), coronary artery atherosclerosis and emphysema (ICD10-J43.9). 4. Cholelithiasis These results will be called to the ordering clinician or representative by the Radiologist Assistant, and communication documented in the PACS or [employer]. Electronically Signed   By: Jeronimo Greaves M.D.   On: 10/16/2021 09:44   DG CHEST PORT 1 VIEW  Result Date: 10/16/2021 CLINICAL DATA:  Right chest tube follow-up.  No current complaints. EXAM: PORTABLE CHEST 1 VIEW COMPARISON:  Portable chest yesterday at 7:28 a.m. FINDINGS: 5:06 a.m. Pigtail chest tube laterally on the right is unchanged in positioning. There is no visible pneumothorax. There is a stable small residual right pleural effusion and scattered linear atelectasis in the adjacent hypoinflated right base. There could be an underlying right basilar consolidation. The right base is obscured by the elevated hemidiaphragm. There is scattered linear atelectasis in the lateral left base with remaining lungs clear. The cardiac size is normal with stable mediastinum. Thoracic spondylosis. IMPRESSION: Stable overall aeration. No pneumothorax is seen. A right  basilar infiltrate could be obscured by the elevated hemidiaphragm. No new abnormality. Electronically Signed   By: Almira Bar M.D.   On: 10/16/2021 07:02   DG CHEST PORT 1 VIEW  Result Date: 10/15/2021 CLINICAL DATA:  Chest tube in place. EXAM: PORTABLE CHEST 1 VIEW COMPARISON:  Radiographs 10/14/2021 and 10/13/2021.  CT 10/11/2021. FINDINGS: 0728 hours. Similar location of the right pigtail chest tube. Unchanged small residual right-sided pleural effusion and mild basilar atelectasis. No pneumothorax. The left lung is clear. The heart size and mediastinal contours are stable. IMPRESSION: Stable residual small right pleural effusion and right basilar pulmonary opacity. No pneumothorax. Electronically Signed   By: Carey Bullocks M.D.   On: 10/15/2021 10:34        Scheduled Meds:  allopurinol  300 mg Oral Daily   amoxicillin-clavulanate  1 tablet Oral Q12H   doxycycline  100 mg Oral Q12H   ezetimibe  10 mg Oral Daily   fenofibrate  160 mg Oral Daily   icosapent Ethyl  2 g Oral BID   ipratropium-albuterol  3 mL Nebulization BID   irbesartan  300 mg Oral Daily   mometasone-formoterol  2 puff Inhalation BID   montelukast  10 mg Oral QHS   pantoprazole  20 mg Oral BID   predniSONE  40 mg Oral Q breakfast   sodium chloride flush  10 mL Other Q8H   sodium chloride flush  10 mL Other Q8H   Continuous Infusions:  sodium chloride Stopped (10/13/21 1900)     LOS: 4 days    Time spent: 45 minutes spent on chart review, discussion with nursing staff, consultants, updating family and interview/physical exam; more than 50% of that time was spent in  counseling and/or coordination of care.    Joseph Art, DO [facility] Available via Epic secure chat 7am-7pm After these hours, please refer to coverage provider listed on [email or web address] 10/16/2021, 12:17 PM
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
## 2021-10-17 DIAGNOSIS — D72829 Elevated white blood cell count, unspecified: Secondary | ICD-10-CM | POA: Diagnosis not present

## 2021-10-17 DIAGNOSIS — J90 Pleural effusion, not elsewhere classified: Secondary | ICD-10-CM | POA: Diagnosis not present

## 2021-10-17 LAB — CULTURE, BLOOD (ROUTINE X 2)
Culture: NO GROWTH
Culture: NO GROWTH
Special Requests: ADEQUATE

## 2021-10-17 LAB — MAGNESIUM: Magnesium: 1.9 mg/dL (ref 1.7–2.4)

## 2021-10-17 LAB — BASIC METABOLIC PANEL
Anion gap: 10 (ref 5–15)
BUN: 24 mg/dL — ABNORMAL HIGH (ref 6–20)
CO2: 27 mmol/L (ref 22–32)
Calcium: 9.1 mg/dL (ref 8.9–10.3)
Chloride: 104 mmol/L (ref 98–111)
Creatinine, Ser: 0.82 mg/dL (ref 0.61–1.24)
GFR, Estimated: >60 mL/min (ref >60)
Glucose, Bld: 124 mg/dL — ABNORMAL HIGH (ref 70–99)
Potassium: 3.4 mmol/L — ABNORMAL LOW (ref 3.5–5.1)
Sodium: 141 mmol/L (ref 135–145)

## 2021-10-17 LAB — CULTURE, BODY FLUID W GRAM STAIN -BOTTLE: Culture: NO GROWTH

## 2021-10-17 MED ORDER — POTASSIUM CHLORIDE CRYS ER 20 MEQ PO TBCR
40.0000 meq | EXTENDED_RELEASE_TABLET | Freq: Once | ORAL | Status: AC
  Administered 2021-10-17: 40 meq via ORAL
  Filled 2021-10-17: qty 2

## 2021-10-17 MED ORDER — SKYRIZI PEN 150 MG/ML ~~LOC~~ SOAJ: 150.0000 mg | SUBCUTANEOUS | Status: AC

## 2021-10-17 NOTE — Discharge Summary (Signed)
Physician Discharge Summary  Gabriel Wong [id number] DOB: 09-28-1965 DOA: 10/11/2021  PCP: Silverio Decamp, MD  Admit date: 10/11/2021 Discharge date: 10/17/2021  Admitted From: home Discharge disposition: home   Recommendations for Outpatient Follow-Up:   2 weeks of augmentin BMP 1 week  Discharge Diagnosis:   Principal Problem:   CAP (community acquired pneumonia) Active Problems:   Generalized anxiety disorder   Mixed hyperlipidemia   Essential hypertension   Gastroesophageal reflux disease with esophagitis   Sepsis ([place])   Hypokalemia   Acute asthma exacerbation   Allergic rhinitis   Pleural effusion    Discharge Condition: Improved.  Diet recommendation: Low sodium, heart healthy  Wound care: None.  Code status: Full.   History of Present Illness:   Gabriel Wong is a 56 y.o. male with medical history significant for moderate persistent asthma, hypertension, hyperlipidemia, allergic rhinitis, who is admitted to [facility] on 10/11/2021 by way of transfer from [facility] ED with community-acquired pneumonia after presenting from home to the latter complaining of shortness of breath.    The patient reports approximately 2 weeks of shortness of shortness of breath associated with new onset nonproductive cough as well as subjective fever in the absence of chills, fully rigors, or generalized myalgias.  Not associate with any orthopnea, PND, or worsening peripheral edema.  No recent chest pain, diaphoresis, palpitations, nausea, vomiting, presyncope, syncope.  He also denies any recent hemoptysis, new lower extremity erythema or calf tenderness.     Denies any recent new abdominal pain, diarrhea, rash.  No recent dysuria or gross hematuria.   Conveys history of moderate persistent asthma, will denying any known history of chronic supplemental oxygen requirements.  Conveys that he is a current smoker, having smoked approximately 1/4 pack/day  over the course of the last 25 years.   In the setting of the above symptoms, he was treated with both azithromycin as well as dexamethasone as an outpatient, noting no significant improvement with these interventions, ultimately resulting in his presentation to [facility] ED on 10/11/2021 for further evaluation and management thereof.   Hospital Course by Problem:   Sepsis due to CAP (community acquired pneumonia)  -attempted thoracentesis but only 150cc removed due to loculation PCCM consult: plan for pig tail catheter plus lytics -no need for CVTS intervention -chest tube removed on 7/3 -2 weeks of augmentin    Allergic rhinitis - cont home Flonase.    ? Acute asthma exacerbation -admitted started on Solumedrol 80 mg IV twice daily- will wean to PO and then off after 5 days.  -Scheduled duo nebulizers.  -Continue Singulair. -does not plan to ever stop smoking (says he only does occasionally)   Hypokalemia/hypomagnesemia -repleted   Essential hypertension -resume home meds as able     Generalized anxiety disorder  Continue outpatient as needed Xanax.        Medical Consultants:    Pccm cvts  Discharge Exam:   Vitals:   10/17/21 0729 10/17/21 0746  BP: (!) 149/102   Pulse: (!) 59   Resp: 17   Temp: 97.9 F (36.6 C)   SpO2: 98% 98%   Vitals:   10/16/21 2026 10/17/21 0025 10/17/21 0729 10/17/21 0746  BP: 127/81 (!) 147/81 (!) 149/102   Pulse: 74 67 (!) 59   Resp: 16 16 17    Temp: 98.2 F (36.8 C) 97.8 F (36.6 C) 97.9 F (36.6 C)   TempSrc: Oral Oral Oral   SpO2: 96% 100%  98% 98%  Weight:  93 kg    Height:        General exam: Appears calm and comfortable.    The results of significant diagnostics from this hospitalization (including imaging, microbiology, ancillary and laboratory) are listed below for reference.     Procedures and Diagnostic Studies:   IR THORACENTESIS ASP PLEURAL SPACE W/IMG GUIDE  Result Date: 10/12/2021 INDICATION: Pneumonia  with right pleural effusion. Request for diagnostic and therapeutic thoracentesis. EXAM: ULTRASOUND GUIDED RIGHT THORACENTESIS MEDICATIONS: 1% lidocaine 20 mL COMPLICATIONS: None immediate. PROCEDURE: An ultrasound guided thoracentesis was thoroughly discussed with the patient and questions answered. The benefits, risks, alternatives and complications were also discussed. The patient understands and wishes to proceed with the procedure. Written consent was obtained. Ultrasound was performed to localize and mark an adequate pocket of fluid in the right chest. The area was then prepped and draped in the normal sterile fashion. 1% Lidocaine was used for local anesthesia. Under ultrasound guidance a 6 Fr Safe-T-Centesis catheter was introduced. Thoracentesis was performed. The catheter was removed and a dressing applied. FINDINGS: A total of only 150 mL of hazy yellow fluid was removed due to the fluid being loculated. Samples were sent to the laboratory as requested by the clinical team. IMPRESSION: Successful ultrasound guided right thoracentesis yielding only 150 mL of pleural fluid due to fluid being loculated. No pneumothorax on post-procedure chest x-ray. Procedure performed by: Gareth Eagle, PA-C Electronically Signed   By: Corrie Mckusick D.O.   On: 10/12/2021 15:14   DG Chest 1 View  Result Date: 10/12/2021 CLINICAL DATA:  Status post right thoracentesis EXAM: CHEST  1 VIEW COMPARISON:  CT 10/11/2021, x-ray 10/11/2021 FINDINGS: Moderate to large loculated right-sided pleural effusion, similar in size to the CT but increased compared to the previous x-ray. Patchy airspace opacity throughout the right lung. Left lung is clear. No left-sided pleural effusion. Heart size is stable. No pneumothorax. IMPRESSION: 1. Moderate to large loculated right-sided pleural effusion, similar in size to the recent CT, but increased compared to previous x-ray. No pneumothorax. 2. Patchy airspace opacity throughout the right lung  could represent atelectasis and/or pneumonia. Electronically Signed   By: Davina Poke D.O.   On: 10/12/2021 09:08   CT Angio Chest PE W and/or Wo Contrast  Result Date: 10/11/2021 CLINICAL DATA:  Chest pain, shortness of breath EXAM: CT ANGIOGRAPHY CHEST WITH CONTRAST TECHNIQUE: Multidetector CT imaging of the chest was performed using the standard protocol during bolus administration of intravenous contrast. Multiplanar CT image reconstructions and MIPs were obtained to evaluate the vascular anatomy. RADIATION DOSE REDUCTION: This exam was performed according to the departmental dose-optimization program which includes automated exposure control, adjustment of the mA and/or kV according to patient size and/or use of iterative reconstruction technique. CONTRAST:  48mL OMNIPAQUE IOHEXOL 350 MG/ML SOLN COMPARISON:  Previous chest radiographs done earlier today and CT chest done on 06/09/2020 FINDINGS: Cardiovascular: There is homogeneous enhancement in the thoracic aorta. There is ectasia of ascending thoracic aorta measuring 3.7 cm. There are no intraluminal filling defects in the central pulmonary artery branches. Evaluation of small peripheral branches in the lower lung fields is limited by motion artifacts and infiltrates. Mediastinum/Nodes: No significant lymphadenopathy seen. Lungs/Pleura: There is moderate to large right pleural effusion. Part of the effusion appears to be loculated in the periphery of right upper and right mid lung fields. Density measurements in the right pleural effusion are less than 10 Hounsfield units. There is no significant focal soft  tissue thickening in the pleural surface. There is compression atelectasis in the right lower lobe. Small patchy alveolar and ground-glass densities seen in the right upper lobe and right middle lobe. Left lung is mostly clear. Subtle ground-glass densities seen in the left lower lung fields without focal consolidation. There is no left pleural  effusion. There is no pneumothorax. Upper Abdomen: There is calcified gallbladder stone. There is no dilation of bile ducts. There are small low-density foci in the spleen. Musculoskeletal: No acute findings are seen. Review of the MIP images confirms the above findings. IMPRESSION: There is no evidence of central pulmonary artery embolism. There is no evidence of thoracic aortic dissection. There is moderate to large right pleural effusion part of which is loculated in the periphery of right upper and right mid lung fields. Possibility of empyema is not excluded. Thoracentesis as clinically warranted should be considered. Compression atelectasis is seen in the right lower lobe. There are patchy alveolar and ground-glass infiltrates in the right upper and right mid lung fields. Faint ground-glass densities seen in the left lower lung fields. Findings suggest possible multifocal pneumonia. Gallbladder stone. Other findings as described in the body of the report. Electronically Signed   By: Elmer Picker M.D.   On: 10/11/2021 20:40   DG Chest 2 View  Result Date: 10/11/2021 CLINICAL DATA:  SOB/cough worsening for 5 weeks EXAM: CHEST - 2 VIEW COMPARISON:  Chest CT 06/09/2020 FINDINGS: The cardiomediastinal silhouette is within normal limits. There is a small right pleural effusion and adjacent right lower lung opacities. Left lung is clear. No pneumothorax. There is no acute osseous abnormality. Thoracic spondylosis. IMPRESSION: Small right pleural effusion with adjacent right lower lung opacities which could be atelectasis or infection. Electronically Signed   By: Maurine Simmering M.D.   On: 10/11/2021 11:04     Labs:   Basic Metabolic Panel: Recent Labs  Lab 10/11/21 0000 10/11/21 1910 10/12/21 0246 10/14/21 1024 10/17/21 0039  NA 141 141 136 140 141  K 3.8 3.4* 3.2* 4.4 3.4*  CL 98 99 98 101 104  CO2 31 28 27 26 27   GLUCOSE 91 110* 201* 160* 124*  BUN 19 23* 20 26* 24*  CREATININE 0.86 0.93  0.88 0.80 0.82  CALCIUM 9.8 9.6 8.6* 9.3 9.1  MG  --   --  1.6*  --  1.9  PHOS  --   --  2.9  --   --    GFR Estimated Creatinine Clearance: 115.2 mL/min (by C-G formula based on SCr of 0.82 mg/dL). Liver Function Tests: Recent Labs  Lab 10/11/21 0000 10/12/21 0246  AST 23 22  ALT 27 26  ALKPHOS  --  43  BILITOT 1.2 0.8  PROT 6.5 5.8*  ALBUMIN  --  2.3*   No results for input(s): "LIPASE", "AMYLASE" in the last 168 hours. No results for input(s): "AMMONIA" in the last 168 hours. Coagulation profile No results for input(s): "INR", "PROTIME" in the last 168 hours.  CBC: Recent Labs  Lab 10/11/21 1910 10/12/21 0246 10/14/21 1024  WBC 19.2* 17.9* 22.0*  NEUTROABS  --  14.9*  --   HGB 13.2 12.7* 12.9*  HCT 38.2* 37.0* 37.0*  MCV 97.9 99.7 101.6*  PLT 444* 370 374   Cardiac Enzymes: No results for input(s): "CKTOTAL", "CKMB", "CKMBINDEX", "TROPONINI" in the last 168 hours. BNP: Invalid input(s): "POCBNP" CBG: No results for input(s): "GLUCAP" in the last 168 hours. D-Dimer No results for input(s): "DDIMER" in the last  72 hours. Hgb A1c No results for input(s): "HGBA1C" in the last 72 hours. Lipid Profile No results for input(s): "CHOL", "HDL", "LDLCALC", "TRIG", "CHOLHDL", "LDLDIRECT" in the last 72 hours. Thyroid function studies No results for input(s): "TSH", "T4TOTAL", "T3FREE", "THYROIDAB" in the last 72 hours.  Invalid input(s): "FREET3" Anemia work up No results for input(s): "VITAMINB12", "FOLATE", "FERRITIN", "TIBC", "IRON", "RETICCTPCT" in the last 72 hours. Microbiology Recent Results (from the past 240 hour(s))  Blood culture (routine x 2)     Status: None   Collection Time: 10/11/21  9:40 PM   Specimen: BLOOD  Result Value Ref Range Status   Specimen Description   Final    BLOOD Performed at [facility], [address], [place], [place] [address]    Special Requests   Final    NONE Performed at [facility], [address], [place], [place] [address]    Culture   Final    NO GROWTH 5 DAYS Performed at [facility], [place] [address], [place], [place] [address]    Report Status 10/17/2021 FINAL  Final  Blood culture (routine x 2)     Status: None   Collection Time: 10/11/21  9:54 PM   Specimen: BLOOD RIGHT ARM  Result Value Ref Range Status   Specimen Description BLOOD RIGHT ARM RT AC  Final   Special Requests   Final    BOTTLES DRAWN AEROBIC AND ANAEROBIC Blood Culture adequate volume   Culture   Final    NO GROWTH 5 DAYS Performed at [facility], [place] [address] [place], [place] [address]    Report Status 10/17/2021 FINAL  Final  Gram stain     Status: None   Collection Time: 10/12/21  9:22 AM   Specimen: Lung, Right; Pleural Fluid  Result Value Ref Range Status   Specimen Description FLUID  Final   Special Requests PLEURAL  Final   Gram Stain   Final    FEW WBC PRESENT, PREDOMINANTLY PMN RARE GRAM POSITIVE COCCI IN PAIRS RARE SQUAMOUS EPITHELIAL CELLS PRESENT Gram Stain Report Called to,Read Back By and Verified With:  C/ AMBER C., RN 10/12/21 1142 A. LAFRANCE Performed at [facility], [place] [address] [place], [place] [address]    Report Status 10/12/2021 FINAL  Final  Fungus Culture With Stain     Status: None (Preliminary result)   Collection Time: 10/12/21  9:22 AM   Specimen: Lung, Right; Pleural Fluid  Result Value Ref Range Status   Fungus Stain Final report  Final    Comment: (NOTE) Performed At: [facility] [place], [place] [id number] Rush Farmer MD [id number]    Fungus (Mycology) Culture PENDING  Incomplete   Fungal Source FLUID  Final    Comment: Performed at [facility], [place] [address], [place], [place] [address]  Acid Fast Smear (AFB)     Status: None   Collection Time: 10/12/21  9:22 AM   Specimen: Lung, Right; Pleural Fluid  Result Value Ref Range Status   AFB Specimen Processing  Concentration  Final   Acid Fast Smear Negative  Final    Comment: (NOTE) Performed At: [facility] [place], [place] [id number] Rush Farmer MD [id number]    Source (AFB) FLUID  Final    Comment: Performed at [facility], [place] [address], [place], [place] [address]  Culture, body fluid w Gram Stain-bottle     Status:  None   Collection Time: 10/12/21  9:22 AM   Specimen: Fluid  Result Value Ref Range Status   Specimen Description FLUID  Final   Special Requests PLEURAL RIGHT LUNG  Final   Culture   Final    NO GROWTH 5 DAYS Performed at [facility], [address] [place], [place] [address]    Report Status 10/17/2021 FINAL  Final  Fungus Culture Result     Status: None   Collection Time: 10/12/21  9:22 AM  Result Value Ref Range Status   Result 1 Comment  Final    Comment: (NOTE) KOH/Calcofluor preparation:  no fungus observed. Performed At: [facility] [address] [place], [place] [id number] Jolene Schimke MD [id number]   Body fluid culture w Gram Stain     Status: None   Collection Time: 10/13/21  3:05 PM   Specimen: Pleural Fluid  Result Value Ref Range Status   Specimen Description PLEURAL  Final   Special Requests THORACENTESIS  Final   Gram Stain NO WBC SEEN NO ORGANISMS SEEN   Final   Culture   Final    NO GROWTH Performed at [facility], [address], [place], [place] [address]    Report Status 10/16/2021 FINAL  Final     Discharge Instructions:   Discharge Instructions     Diet general   Complete by: As directed    Increase activity slowly   Complete by: As directed    No wound care   Complete by: As directed       Allergies as of 10/17/2021       Reactions   Lipitor [atorvastatin] Other (See Comments)   Elevated LFTs   Tylenol [acetaminophen] Other (See Comments)   Elevated LFTs        Medication List     STOP taking these medications    doxycycline 100 MG  tablet Commonly known as: VIBRA-TABS       TAKE these medications    albuterol 108 (90 Base) MCG/ACT inhaler Commonly known as: Ventolin HFA inhale TWO PUFFS into THE lungs every 4-6 hours AS NEEDED FOR shortness OF breath OR FOR WHEEZING What changed:  how much to take how to take this when to take this reasons to take this additional instructions   allopurinol 300 MG tablet Commonly known as: ZYLOPRIM TAKE ONE TABLET BY MOUTH EVERY DAY   ALPRAZolam 0.25 MG tablet Commonly known as: XANAX TAKE ONE TABLET BY MOUTH TWICE DAILY AS NEEDED What changed: reasons to take this   amLODipine 5 MG tablet Commonly known as: NORVASC TAKE ONE TABLET BY MOUTH EVERY DAY   amoxicillin-clavulanate 875-125 MG tablet Commonly known as: AUGMENTIN Take 1 tablet by mouth every 12 (twelve) hours.   Azelaic Acid 15 % gel After skin is thoroughly washed and patted dry, gently massage a thin film of azelaic acid cream into the affected area 1-2x daily. What changed: See the new instructions.   colchicine 0.6 MG tablet Take 1 tablet (0.6 mg total) by mouth daily as needed. What changed: reasons to take this   eplerenone 25 MG tablet Commonly known as: INSPRA TAKE ONE TABLET BY MOUTH EVERY DAY   ezetimibe 10 MG tablet Commonly known as: ZETIA TAKE ONE TABLET BY MOUTH EVERY DAY   fenofibrate 160 MG tablet TAKE ONE TABLET BY MOUTH EVERY DAY   fluticasone 50 MCG/ACT nasal spray Commonly known as: FLONASE Place 1 spray into both nostrils daily.  fluticasone-salmeterol 250-50 MCG/ACT Aepb Commonly known as: Advair Diskus inhale ONE PUFF into THE lungs TWICE DAILY What changed: See the new instructions.   GUAIFENESIN PO Take 1 capsule by mouth daily as needed (mucus).   hydrochlorothiazide 12.5 MG tablet Commonly known as: HYDRODIURIL Take 1 tablet (12.5 mg total) by mouth daily.   irbesartan 300 MG tablet Commonly known as: AVAPRO TAKE ONE TABLET BY MOUTH EVERY DAY    lansoprazole 30 MG capsule Commonly known as: PREVACID TAKE ONE CAPSULE BY MOUTH TWICE DAILY BEFORE MEALS   montelukast 10 MG tablet Commonly known as: SINGULAIR TAKE ONE TABLET BY MOUTH AT BEDTIME   oxyCODONE 5 MG immediate release tablet Commonly known as: Oxy IR/ROXICODONE Take 1 tablet (5 mg total) by mouth every 4 (four) hours as needed for moderate pain.   Skyrizi Pen 150 MG/ML Soaj Generic drug: Risankizumab-rzaa Inject 150 mg into the skin every 3 (three) months.   Vascepa 1 g capsule Generic drug: icosapent Ethyl TAKE TWO CAPSULES BY MOUTH TWICE DAILY What changed: how much to take          Time coordinating discharge: 45 min  Signed:  Joseph Art DO  [facility] 10/17/2021, 8:13 AM

## 2021-10-17 NOTE — Progress Notes (Signed)
Pt given discharge instructions, medication lists, follow up appointments, and when to call the doctor.  Pt verbalizes understanding. Pt given home medications and TOC medications. Repeated instructions concerning medication several times with pt. He stated "I got a lot on my mind" States he lives alone and has been doing his medications every Sunday for that week. Transported to main entrance via wheelchair. Thomas Hoff, RN

## 2021-10-24 LAB — MISC LABCORP TEST (SEND OUT): Labcorp test code: 9985

## 2021-11-02 ENCOUNTER — Ambulatory Visit (INDEPENDENT_AMBULATORY_CARE_PROVIDER_SITE_OTHER): Payer: BC Managed Care – PPO

## 2021-11-02 ENCOUNTER — Encounter: Payer: Self-pay | Admitting: Family Medicine

## 2021-11-02 ENCOUNTER — Other Ambulatory Visit: Payer: Self-pay | Admitting: *Deleted

## 2021-11-02 ENCOUNTER — Ambulatory Visit (INDEPENDENT_AMBULATORY_CARE_PROVIDER_SITE_OTHER): Payer: BC Managed Care – PPO | Admitting: Family Medicine

## 2021-11-02 VITALS — BP 120/74 | HR 76 | Ht 70.0 in | Wt 200.0 lb

## 2021-11-02 DIAGNOSIS — J189 Pneumonia, unspecified organism: Secondary | ICD-10-CM

## 2021-11-02 DIAGNOSIS — E876 Hypokalemia: Secondary | ICD-10-CM

## 2021-11-02 DIAGNOSIS — J432 Centrilobular emphysema: Secondary | ICD-10-CM | POA: Diagnosis not present

## 2021-11-02 DIAGNOSIS — Z111 Encounter for screening for respiratory tuberculosis: Secondary | ICD-10-CM | POA: Diagnosis not present

## 2021-11-02 DIAGNOSIS — I10 Essential (primary) hypertension: Secondary | ICD-10-CM | POA: Diagnosis not present

## 2021-11-02 DIAGNOSIS — R935 Abnormal findings on diagnostic imaging of other abdominal regions, including retroperitoneum: Secondary | ICD-10-CM

## 2021-11-02 DIAGNOSIS — R499 Unspecified voice and resonance disorder: Secondary | ICD-10-CM | POA: Insufficient documentation

## 2021-11-02 DIAGNOSIS — K862 Cyst of pancreas: Secondary | ICD-10-CM | POA: Insufficient documentation

## 2021-11-02 DIAGNOSIS — J9811 Atelectasis: Secondary | ICD-10-CM | POA: Diagnosis not present

## 2021-11-02 DIAGNOSIS — J4530 Mild persistent asthma, uncomplicated: Secondary | ICD-10-CM

## 2021-11-02 DIAGNOSIS — J90 Pleural effusion, not elsewhere classified: Secondary | ICD-10-CM | POA: Diagnosis not present

## 2021-11-02 DIAGNOSIS — A419 Sepsis, unspecified organism: Secondary | ICD-10-CM

## 2021-11-02 DIAGNOSIS — Z113 Encounter for screening for infections with a predominantly sexual mode of transmission: Secondary | ICD-10-CM | POA: Diagnosis not present

## 2021-11-02 DIAGNOSIS — Z0000 Encounter for general adult medical examination without abnormal findings: Secondary | ICD-10-CM

## 2021-11-02 DIAGNOSIS — R739 Hyperglycemia, unspecified: Secondary | ICD-10-CM | POA: Diagnosis not present

## 2021-11-02 DIAGNOSIS — N139 Obstructive and reflux uropathy, unspecified: Secondary | ICD-10-CM | POA: Diagnosis not present

## 2021-11-02 NOTE — Progress Notes (Signed)
Established Patient Office Visit  Subjective   Patient ID: Gabriel Wong, male    DOB: 12-06-1965  Age: 57 y.o. MRN: [id number]  Chief Complaint  Patient presents with   Hospitalization Follow-up    HPI  Here today for hospital follow-up for community-acquired pneumonia.  He was admitted on June 28 that [facility] call [facility] and discharged home on July 4.  He was diagnosed with community-acquired pneumonia and sepsis.  He does have underlying asthma.  Prior to admission he had almost 2 weeks of shortness of breath and nonproductive cough with some subjective fevers.  An outpatient he was initially treated with azithromycin and dexamethasone.  He did have chest tube placed for effusion.  It was removed on July 3.  Since then he is continued to improve.  He finished his antibiotics about 2 days ago.  Also for the last 2 days though he has noticed a little bit of midsternal to slightly left chest discomfort.  He is also felt just a little bit more short of breath.  But then today he actually feels a little better.  He also reports that he has had a raspy voice for almost a month no recent cough.  No sore throat.  Nothing that he thinks would irritate it.  He does drink alcohol regularly and smokes a few cigarettes a couple of times a week  Chest CT in the hospital showed mild centrilobular emphysema.  They also noted a cystic lesion between the pancreas and the stomach most likely an exophytic lesion off of the pancreatic body which could represent a pseudocyst or indolent cystic neoplasm.  They recommended abdominal MRI/MRCP with pre and postcontrast.    ROS    Objective:     BP 120/74   Pulse 76   Ht 5\' 10"  (1.778 m)   Wt 200 lb (90.7 kg)   SpO2 97%   BMI 28.70 kg/m    Physical Exam Constitutional:      Appearance: He is well-developed.  HENT:     Head: Normocephalic and atraumatic.     Right Ear: External ear normal.     Left Ear: External ear normal.     Nose: Nose  normal.  Eyes:     Conjunctiva/sclera: Conjunctivae normal.     Pupils: Pupils are equal, round, and reactive to light.  Neck:     Thyroid: No thyromegaly.  Cardiovascular:     Rate and Rhythm: Normal rate.     Heart sounds: Normal heart sounds.  Pulmonary:     Effort: Pulmonary effort is normal.     Breath sounds: Normal breath sounds.  Musculoskeletal:     Cervical back: Neck supple.  Lymphadenopathy:     Cervical: No cervical adenopathy.  Skin:    General: Skin is warm and dry.  Neurological:     Mental Status: He is alert and oriented to person, place, and time.      No results found for any visits on 11/02/21.    The 10-year ASCVD risk score (Arnett DK, et al., 2019) is: 14%    Assessment & Plan:   Problem List Items Addressed This Visit       Respiratory   Asthma, mild persistent (Chronic)    Clear lung exam today.  No wheezing.  No shortness of breath today.      Pleural effusion    Is doing well overall but he has had a little bit of anterior chest discomfort and increased shortness  of breath so would like to get a repeat chest x-ray today for further work-up.      Centrilobular emphysema (HCC)    Discussed that his chest CT did show emphysema and encouraged him to completely stop smoking even though he is not smoking daily.        Digestive   Pancreas cyst    CT report: Cystic lesion positioned between the pancreas and stomach is most likely exophytic off the pancreatic body and could represent a pseudocyst or indolent cystic neoplasm. Recommend nonemergent, outpatient dedicated pre and post contrast abdominal MRI/MRCP. Less preferred would be pancreatic protocol pre and post contrast abdominal CT.        Relevant Orders   MR ABDOMEN MRCP W WO CONTAST     Other   Hypokalemia - Primary   Hoarseness or changing voice    He was sick for several weeks which is most likely contributing to the raspy voice.  If it does not continue to improve in  the next week or 2 then recommend referral to ENT for further work-up he is not having any pain with it.  But he does drink alcohol and smoke intermittently and so is at risk for other complications.      Other Visit Diagnoses     Hypocalcemia       Multifocal pneumonia       Relevant Orders   DG Chest 2 View   Abnormal abdominal CT scan       Relevant Orders   MR ABDOMEN MRCP W WO CONTAST       Hypokalemia and hypocalcemia-due to recheck labs today.  Return if symptoms worsen or fail to improve.    Nani Gasser, MD

## 2021-11-02 NOTE — Assessment & Plan Note (Signed)
Is doing well overall but he has had a little bit of anterior chest discomfort and increased shortness of breath so would like to get a repeat chest x-ray today for further work-up.

## 2021-11-02 NOTE — Assessment & Plan Note (Signed)
He was sick for several weeks which is most likely contributing to the raspy voice.  If it does not continue to improve in the next week or 2 then recommend referral to ENT for further work-up he is not having any pain with it.  But he does drink alcohol and smoke intermittently and so is at risk for other complications.

## 2021-11-02 NOTE — Assessment & Plan Note (Signed)
Clear lung exam today.  No wheezing.  No shortness of breath today.

## 2021-11-02 NOTE — Assessment & Plan Note (Signed)
Discussed that his chest CT did show emphysema and encouraged him to completely stop smoking even though he is not smoking daily.

## 2021-11-02 NOTE — Assessment & Plan Note (Signed)
CT report: Cystic lesion positioned between the pancreas and stomach is most likely exophytic off the pancreatic body and could represent a pseudocyst or indolent cystic neoplasm. Recommend nonemergent, outpatient dedicated pre and post contrast abdominal MRI/MRCP. Less preferred would be pancreatic protocol pre and post contrast abdominal CT.

## 2021-11-03 NOTE — Progress Notes (Signed)
Hi Gabriel Wong, overall your lungs look better.  There is a decrease in the atelectasis and there is still just a little bit of fluid left on that right side.  I would like to repeat your chest x-ray again in about 6 to 8 weeks to make sure that everything has cleared out.  See how you are feeling over the weekend but if you feel like you are still getting some recurrent chest tightness or discomfort then please let us know.

## 2021-11-06 ENCOUNTER — Other Ambulatory Visit: Payer: BC Managed Care – PPO

## 2021-11-06 LAB — COMPLETE METABOLIC PANEL WITH GFR
AG Ratio: 1.7 (calc) (ref 1.0–2.5)
ALT: 36 U/L (ref 9–46)
AST: 36 U/L — ABNORMAL HIGH (ref 10–35)
Albumin: 4.4 g/dL (ref 3.6–5.1)
Alkaline phosphatase (APISO): 47 U/L (ref 35–144)
BUN: 17 mg/dL (ref 7–25)
CO2: 26 mmol/L (ref 20–32)
Calcium: 10.1 mg/dL (ref 8.6–10.3)
Chloride: 104 mmol/L (ref 98–110)
Creat: 1.1 mg/dL (ref 0.70–1.30)
Globulin: 2.6 g/dL (calc) (ref 1.9–3.7)
Glucose, Bld: 85 mg/dL (ref 65–99)
Potassium: 3.5 mmol/L (ref 3.5–5.3)
Sodium: 143 mmol/L (ref 135–146)
Total Bilirubin: 0.9 mg/dL (ref 0.2–1.2)
Total Protein: 7 g/dL (ref 6.1–8.1)
eGFR: 79 mL/min/{1.73_m2} (ref >=60)

## 2021-11-06 LAB — CBC
HCT: 37.9 % — ABNORMAL LOW (ref 38.5–50.0)
Hemoglobin: 13 g/dL — ABNORMAL LOW (ref 13.2–17.1)
MCH: 33.5 pg — ABNORMAL HIGH (ref 27.0–33.0)
MCHC: 34.3 g/dL (ref 32.0–36.0)
MCV: 97.7 fL (ref 80.0–100.0)
MPV: 10.7 fL (ref 7.5–12.5)
Platelets: 279 10*3/uL (ref 140–400)
RBC: 3.88 10*6/uL — ABNORMAL LOW (ref 4.20–5.80)
RDW: 12 % (ref 11.0–15.0)
WBC: 6.6 10*3/uL (ref 3.8–10.8)

## 2021-11-06 LAB — LIPID PANEL
Cholesterol: 212 mg/dL — ABNORMAL HIGH (ref <200)
HDL: 36 mg/dL — ABNORMAL LOW (ref >=40)
LDL Cholesterol (Calc): 142 mg/dL (calc) — ABNORMAL HIGH
Non-HDL Cholesterol (Calc): 176 mg/dL (calc) — ABNORMAL HIGH (ref <130)
Total CHOL/HDL Ratio: 5.9 (calc) — ABNORMAL HIGH (ref <5.0)
Triglycerides: 198 mg/dL — ABNORMAL HIGH (ref <150)

## 2021-11-06 LAB — QUANTIFERON-TB GOLD PLUS
Mitogen-NIL: >10 IU/mL
NIL: 0.1 IU/mL
QuantiFERON-TB Gold Plus: NEGATIVE
TB1-NIL: 0.03 IU/mL
TB2-NIL: 0 IU/mL

## 2021-11-06 LAB — PSA, TOTAL AND FREE
PSA, % Free: 10 % (calc) — ABNORMAL LOW (ref >25)
PSA, Free: 0.2 ng/mL
PSA, Total: 2.1 ng/mL (ref <=4.0)

## 2021-11-06 LAB — HIV ANTIBODY (ROUTINE TESTING W REFLEX): HIV 1&2 Ab, 4th Generation: NONREACTIVE

## 2021-11-06 LAB — HEMOGLOBIN A1C
Hgb A1c MFr Bld: 5.3 % of total Hgb (ref <5.7)
Mean Plasma Glucose: 105 mg/dL
eAG (mmol/L): 5.8 mmol/L

## 2021-11-06 LAB — C. TRACHOMATIS/N. GONORRHOEAE RNA
C. trachomatis RNA, TMA: NOT DETECTED
N. gonorrhoeae RNA, TMA: NOT DETECTED

## 2021-11-06 LAB — HSV 2 ANTIBODY, IGG: HSV 2 Glycoprotein G Ab, IgG: <0.9 index

## 2021-11-06 LAB — URIC ACID: Uric Acid, Serum: 5.8 mg/dL (ref 4.0–8.0)

## 2021-11-06 LAB — TSH: TSH: 2.27 mIU/L (ref 0.40–4.50)

## 2021-11-06 LAB — RPR: RPR Ser Ql: NONREACTIVE

## 2021-11-07 DIAGNOSIS — H31091 Other chorioretinal scars, right eye: Secondary | ICD-10-CM | POA: Diagnosis not present

## 2021-11-07 DIAGNOSIS — H35373 Puckering of macula, bilateral: Secondary | ICD-10-CM | POA: Diagnosis not present

## 2021-11-07 DIAGNOSIS — H35363 Drusen (degenerative) of macula, bilateral: Secondary | ICD-10-CM | POA: Diagnosis not present

## 2021-11-07 DIAGNOSIS — H30022 Focal chorioretinal inflammation of posterior pole, left eye: Secondary | ICD-10-CM | POA: Diagnosis not present

## 2021-11-07 DIAGNOSIS — H35713 Central serous chorioretinopathy, bilateral: Secondary | ICD-10-CM | POA: Diagnosis not present

## 2021-11-13 LAB — FUNGUS CULTURE WITH STAIN

## 2021-11-13 LAB — FUNGUS CULTURE RESULT

## 2021-11-13 LAB — FUNGAL ORGANISM REFLEX

## 2021-11-14 ENCOUNTER — Ambulatory Visit (INDEPENDENT_AMBULATORY_CARE_PROVIDER_SITE_OTHER): Payer: BC Managed Care – PPO

## 2021-11-14 ENCOUNTER — Encounter: Payer: Self-pay | Admitting: Sports Medicine

## 2021-11-14 ENCOUNTER — Telehealth: Payer: Self-pay

## 2021-11-14 ENCOUNTER — Ambulatory Visit (INDEPENDENT_AMBULATORY_CARE_PROVIDER_SITE_OTHER): Payer: BC Managed Care – PPO | Admitting: Sports Medicine

## 2021-11-14 VITALS — BP 124/70 | HR 71 | Ht 70.0 in | Wt 207.0 lb

## 2021-11-14 DIAGNOSIS — R935 Abnormal findings on diagnostic imaging of other abdominal regions, including retroperitoneum: Secondary | ICD-10-CM | POA: Diagnosis not present

## 2021-11-14 DIAGNOSIS — K862 Cyst of pancreas: Secondary | ICD-10-CM | POA: Diagnosis not present

## 2021-11-14 DIAGNOSIS — J90 Pleural effusion, not elsewhere classified: Secondary | ICD-10-CM | POA: Diagnosis not present

## 2021-11-14 DIAGNOSIS — Z87891 Personal history of nicotine dependence: Secondary | ICD-10-CM

## 2021-11-14 DIAGNOSIS — K760 Fatty (change of) liver, not elsewhere classified: Secondary | ICD-10-CM | POA: Diagnosis not present

## 2021-11-14 DIAGNOSIS — Z1211 Encounter for screening for malignant neoplasm of colon: Secondary | ICD-10-CM | POA: Diagnosis not present

## 2021-11-14 DIAGNOSIS — R2242 Localized swelling, mass and lump, left lower limb: Secondary | ICD-10-CM

## 2021-11-14 DIAGNOSIS — E782 Mixed hyperlipidemia: Secondary | ICD-10-CM

## 2021-11-14 DIAGNOSIS — Z0000 Encounter for general adult medical examination without abnormal findings: Secondary | ICD-10-CM

## 2021-11-14 DIAGNOSIS — K863 Pseudocyst of pancreas: Secondary | ICD-10-CM | POA: Diagnosis not present

## 2021-11-14 MED ORDER — GADOBUTROL 1 MMOL/ML IV SOLN
9.3000 mL | Freq: Once | INTRAVENOUS | Status: AC | PRN
  Administered 2021-11-14: 9.3 mL via INTRAVENOUS

## 2021-11-14 MED ORDER — ROSUVASTATIN CALCIUM 10 MG PO TABS: 10.0000 mg | ORAL_TABLET | Freq: Every day | ORAL | 3 refills | Status: DC

## 2021-11-14 NOTE — Assessment & Plan Note (Signed)
Gabriel Wong is also noted subcutaneous masses left upper inner thigh, on exam these do feel like lymph nodes, I would like an ultrasound to evaluate for any worrisome features.

## 2021-11-14 NOTE — Assessment & Plan Note (Signed)
Tells me he has quit smoking.

## 2021-11-14 NOTE — Assessment & Plan Note (Signed)
Gabriel Wong returns for an annual physical, Cologuard was 09/29/2018, ordering a repeat.

## 2021-11-14 NOTE — Assessment & Plan Note (Signed)
Pancreatic mass noted on CT in the hospital, he does have an MR abdomen and pelvis coming up and scheduled.

## 2021-11-14 NOTE — Assessment & Plan Note (Signed)
Lipids still elevated, continue Vascepa, Zetia, fenofibrate, adding rosuvastatin 10. Recheck lipids in 2 months fasting.

## 2021-11-14 NOTE — Telephone Encounter (Signed)
Patient called to inquire about the possibility that the med may cause elevated liver enzymes. He is questioning if the reward is worth the risk.

## 2021-11-14 NOTE — Progress Notes (Signed)
Subjective:    CC: Annual Physical Exam  HPI:  This patient is here for their annual physical  I reviewed the past medical history, family history, social history, surgical history, and allergies today and no changes were needed.  Please see the problem list section below in epic for further details.  Past Medical History: Past Medical History:  Diagnosis Date   Alcoholic hepatitis    Anxiety    Asthma    Gout 04/28/2015   Hyperlipemia    Hypertension    Insomnia    Rosacea    Seasonal allergies    Past Surgical History: Past Surgical History:  Procedure Laterality Date   BACK SURGERY     CHEST TUBE INSERTION Right 10/13/2021   Procedure: INSERTION PLEURAL DRAINAGE CATHETER;  Surgeon: Josephine Igo, DO;  Location: [facility];  Service: Pulmonary;  Laterality: Right;  4fr pigtail catheter   FISSURECTOMY     IR THORACENTESIS ASP PLEURAL SPACE W/IMG GUIDE  10/12/2021   Social History: Social History   Socioeconomic History   Marital status: Single    Spouse name: Not on file   Number of children: Not on file   Years of education: Not on file   Highest education level: Not on file  Occupational History   Not on file  Tobacco Use   Smoking status: Every Day    Packs/day: 0.25    Years: 25.00    Total pack years: 6.25    Types: Cigarettes, Cigars   Smokeless tobacco: Never  Vaping Use   Vaping Use: Never used  Substance and Sexual Activity   Alcohol use: Yes   Drug use: Yes    Types: Marijuana   Sexual activity: Not on file  Other Topics Concern   Not on file  Social History Narrative   Not on file   Social Determinants of Health   Financial Resource Strain: Not on file  Food Insecurity: Not on file  Transportation Needs: Not on file  Physical Activity: Not on file  Stress: Not on file  Social Connections: Not on file   Family History: No family history on file. Allergies: Allergies  Allergen Reactions   Lipitor [Atorvastatin] Other (See  Comments)    Elevated LFTs   Tylenol [Acetaminophen] Other (See Comments)    Elevated LFTs   Medications: See med rec.  Review of Systems: No headache, visual changes, nausea, vomiting, diarrhea, constipation, dizziness, abdominal pain, skin rash, fevers, chills, night sweats, swollen lymph nodes, weight loss, chest pain, body aches, joint swelling, muscle aches, shortness of breath, mood changes, visual or auditory hallucinations.  Objective:    General: Well Developed, well nourished, and in no acute distress.  Neuro: Alert and oriented x3, extra-ocular muscles intact, sensation grossly intact. Cranial nerves II through XII are intact, motor, sensory, and coordinative functions are all intact. HEENT: Normocephalic, atraumatic, pupils equal round reactive to light, neck supple, no masses, no lymphadenopathy, thyroid nonpalpable. Oropharynx, nasopharynx, external ear canals are unremarkable. Skin: Warm and dry, no rashes noted.  Cardiac: Regular rate and rhythm, no murmurs rubs or gallops.  Respiratory: Clear to auscultation bilaterally. Not using accessory muscles, speaking in full sentences.  Abdominal: Soft, nontender, nondistended, positive bowel sounds, no masses, no organomegaly.  Musculoskeletal: Shoulder, elbow, wrist, hip, knee, ankle stable, and with full range of motion.  Multiple 1 to 2 cm subcutaneous well-defined movable masses left upper inner thigh.  Impression and Recommendations:    The patient was counselled, risk factors were discussed,  anticipatory guidance given.  Annual physical exam Gabriel Wong returns for an annual physical, Cologuard was 09/29/2018, ordering a repeat.   Pleural effusion [name] is also recently discharged for a community-acquired pneumonia and what sounds to be a parapneumonic effusion with thoracentesis. He is doing well, exam is completely clear. Discontinue HCTZ for now which was started for his pleural effusion. This could be troublesome for his  gout.  Pancreas cyst Pancreatic mass noted on CT in the hospital, he does have an MR abdomen and pelvis coming up and scheduled.  Mass of left thigh [name] is also noted subcutaneous masses left upper inner thigh, on exam these do feel like lymph nodes, I would like an ultrasound to evaluate for any worrisome features.  Mixed hyperlipidemia Lipids still elevated, continue Vascepa, Zetia, fenofibrate, adding rosuvastatin 10. Recheck lipids in 2 months fasting.  Former smoker Tells me he has quit smoking.   ____________________________________________ Gabriel Wong. Gabriel Wong, M.D., ABFM., CAQSM., AME. Primary Care and Sports Medicine [facility]  Adjunct Professor of Family Medicine  [place] [employer]  Restaurant manager, fast food

## 2021-11-14 NOTE — Assessment & Plan Note (Addendum)
Gabriel Wong is also recently discharged for a community-acquired pneumonia and what sounds to be a parapneumonic effusion with thoracentesis. He is doing well, exam is completely clear. Discontinue HCTZ for now which was started for his pleural effusion. This could be troublesome for his gout.

## 2021-11-14 NOTE — Telephone Encounter (Signed)
After discussing the recommendations with patient, he is still very worried about starting the medication due to pharmacist concerns. He did state that he would probably start the medication.

## 2021-11-14 NOTE — Telephone Encounter (Signed)
100% worth the tiny risk, and we always check liver function tests when we recheck cholesterol.

## 2021-11-16 ENCOUNTER — Encounter: Payer: Self-pay | Admitting: Family Medicine

## 2021-11-16 DIAGNOSIS — K760 Fatty (change of) liver, not elsewhere classified: Secondary | ICD-10-CM | POA: Insufficient documentation

## 2021-11-16 DIAGNOSIS — Z1211 Encounter for screening for malignant neoplasm of colon: Secondary | ICD-10-CM | POA: Diagnosis not present

## 2021-11-16 NOTE — Progress Notes (Signed)
Hi Gabriel Wong,  your MRI shows a cyst about 2.7 cm on the pancreas. NO suspicious features which is good. They do recommend repeat MRI/MRCP in 6 months or sampling. I would like to refer you to GI to help up best decide which way to go. Are you ok with referral?I would highly recommend we get a consult. You also have fatty liver so encourage you to cut back on alcohol intake ans work o eating more veggies and cutting back on sugar and carbs.

## 2021-11-21 ENCOUNTER — Telehealth: Payer: Self-pay

## 2021-11-21 ENCOUNTER — Telehealth: Payer: Self-pay | Admitting: Family Medicine

## 2021-11-21 DIAGNOSIS — H5315 Visual distortions of shape and size: Secondary | ICD-10-CM | POA: Diagnosis not present

## 2021-11-21 DIAGNOSIS — K862 Cyst of pancreas: Secondary | ICD-10-CM

## 2021-11-21 DIAGNOSIS — H35363 Drusen (degenerative) of macula, bilateral: Secondary | ICD-10-CM | POA: Diagnosis not present

## 2021-11-21 DIAGNOSIS — H33312 Horseshoe tear of retina without detachment, left eye: Secondary | ICD-10-CM | POA: Diagnosis not present

## 2021-11-21 DIAGNOSIS — H43392 Other vitreous opacities, left eye: Secondary | ICD-10-CM | POA: Diagnosis not present

## 2021-11-21 NOTE — Telephone Encounter (Signed)
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  This needs to be followed up with a repeat MRI/MRCP in 6 months to ensure stability.  I can discuss this in further detail with him in a telephone visit.

## 2021-11-21 NOTE — Telephone Encounter (Signed)
Pt calls with concerns of cyst on pancreas states he would like a referral for gi

## 2021-11-21 NOTE — Telephone Encounter (Signed)
Patient has some concerns regarding the referral that Dr.Metheney had placed for him. He called her line/ cma's line that states she is still on vacation.

## 2021-11-21 NOTE — Telephone Encounter (Signed)
Pt called again this afternoon to schedule f/u appt with Dr.Metheney to go over results of MRI. He was very unhappy that I was not able to get him in until 08/14. Pt requested I send this message. He kept expressing that it should not be that hard to schedule a phone visit.

## 2021-11-21 NOTE — Telephone Encounter (Signed)
Left msg for patient to return call and leave details on VM with what his concerns are about the referral.

## 2021-11-21 NOTE — Assessment & Plan Note (Signed)
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  This needs to be followed up with a repeat MRI/MRCP in 6 months to ensure stability.  I can discuss this in further detail with him in a telephone visit. 

## 2021-11-21 NOTE — Telephone Encounter (Signed)
I never was on vacation, lol it was CME.  Whats his concern?

## 2021-11-21 NOTE — Telephone Encounter (Signed)
Supposed to be scheduled with Dr. Karie Schwalbe and not me.  Please see additional phone note.

## 2021-11-21 NOTE — Telephone Encounter (Signed)
Patient advised to make a virtual appt with provider to discuss concerns. Call transferred to front desk.

## 2021-11-21 NOTE — Telephone Encounter (Signed)
Pt called this morning,stating he is trying to reach provider. Voicemail is stating provider is still on vacation. He has some concerns regarding his referral.

## 2021-11-21 NOTE — Telephone Encounter (Signed)
Patient would like a call regarding the cyst on the pancreas. He wants to discuss urgency, etc.

## 2021-11-21 NOTE — Telephone Encounter (Signed)
Didn't mean to send to [facility].

## 2021-11-22 NOTE — Telephone Encounter (Signed)
He does not need a referral, he was already informed that it really just needed a 86-month follow-up.  But maybe it would be good to do the referral and get him off our backs.

## 2021-11-26 LAB — ACID FAST CULTURE WITH REFLEXED SENSITIVITIES (MYCOBACTERIA): Acid Fast Culture: NEGATIVE

## 2021-11-27 ENCOUNTER — Telehealth: Payer: BC Managed Care – PPO | Admitting: Family Medicine

## 2021-11-27 LAB — COLOGUARD: COLOGUARD: NEGATIVE

## 2021-11-28 ENCOUNTER — Telehealth (INDEPENDENT_AMBULATORY_CARE_PROVIDER_SITE_OTHER): Payer: BC Managed Care – PPO | Admitting: Family Medicine

## 2021-11-28 DIAGNOSIS — K869 Disease of pancreas, unspecified: Secondary | ICD-10-CM

## 2021-11-28 DIAGNOSIS — K862 Cyst of pancreas: Secondary | ICD-10-CM | POA: Diagnosis not present

## 2021-11-28 DIAGNOSIS — H33312 Horseshoe tear of retina without detachment, left eye: Secondary | ICD-10-CM | POA: Diagnosis not present

## 2021-11-28 NOTE — Progress Notes (Signed)
   Virtual Visit via Telephone Note  I connected with Gabriel Wong on 12/01/21 at  3:40 PM EDT by telephone and verified that I am speaking with the correct person using two identifiers.   I discussed the limitations, risks, security and privacy concerns of performing an evaluation and management service by telephone and the availability of in person appointments. I also discussed with the patient that there may be a patient responsible charge related to this service. The patient expressed understanding and agreed to proceed. Former smoker  Patient location: at home.  Provider loccation: In office   Subjective:    CC:   Chief Complaint  Patient presents with   Results    Pancreatic cyst    HPI: F/U MRI results. Questiona bout cystic lesion on the pancreatic body measuring 2.7 cm  Drinks occ but not frequently.  Hx of elevated liver enzymes for about 20 years. No new abnormal pain or abnormal weight loss. Hx of GI ulcer. Takes PPI daily but occ takes 2 day ago. Occ bloating with drinking beer.    Past medical history, Surgical history, Family history not pertinant except as noted below, Social history, Allergies, and medications have been entered into the medical record, reviewed, and corrections made.   Review of Systems: No fevers, chills, night sweats, weight loss, chest pain, or shortness of breath.   Objective:    General: Speaking clearly in complete sentences without any shortness of breath.  Alert and oriented x3.  Normal judgment. No apparent acute distress.    Impression and Recommendations:    Problem List Items Addressed This Visit       Digestive   Pancreas cyst    Went over the results in detail.  Option of GI consultation versus repeat MRI/MRCP in 6 months.  He does drink alcohol regularly but denies heavy drinking.  He would like to consider the option of repeating the MR in 4 months.      Other Visit Diagnoses     Pancreatic lesion    -  Primary        No orders of the defined types were placed in this encounter.   No orders of the defined types were placed in this encounter.    I discussed the assessment and treatment plan with the patient. The patient was provided an opportunity to ask questions and all were answered. The patient agreed with the plan and demonstrated an understanding of the instructions.   The patient was advised to call back or seek an in-person evaluation if the symptoms worsen or if the condition fails to improve as anticipated.  I provided 22 minutes of non-face-to-face time during this encounter.   Gabriel Gasser, MD

## 2021-11-29 NOTE — Assessment & Plan Note (Signed)
Went over the results in detail.  Option of GI consultation versus repeat MRI/MRCP in 6 months.  He does drink alcohol regularly but denies heavy drinking.  He would like to consider the option of repeating the MR in 4 months.

## 2021-12-01 ENCOUNTER — Encounter: Payer: Self-pay | Admitting: Family Medicine

## 2021-12-04 ENCOUNTER — Telehealth: Payer: Self-pay

## 2021-12-04 NOTE — Telephone Encounter (Signed)
Spoke with pt and attempted to schedule him for an appointment due to abnormal MRI/MRCP. Pt states he didn't know why he was being scheduled for an appointment because he thought that all he would need is a repeat MRI and pt states he thought the pseudocyst would go away on its own.  Pt also stated he can only have an appointment on Tuesdays or Thursday mornings. Let pt know that our first available appointment on a Tuesday or Thursday morning would be 02/01/22. Pt stated he can't wait that long if this is urgent. Let pt know that this is not an urgent appointment. Pt still concerned and stated he will be able to have an appointment if he can be seen sooner on a Tuesday or Thursday morning.

## 2021-12-04 NOTE — Telephone Encounter (Signed)
Gabriel Wong, Gabriel Starring., MD  Shellia Cleverly, DO; Orion Modest, RN Outpatient visit with [facility] or myself seems reasonable.  Consideration of EUS pending patient's discussion and clinic visit can be had at that point but with a cyst less than 3 cm, likely early follow-up imaging makes sense.  Thanks.  GM    ----- Message from Shellia Cleverly, DO sent at 12/01/2021 12:45 PM EDT ----- Received a message from this patient's [facility] requesting referral to see me or Dr. Meridee Score for abnormal MRI/MRCP that shows pancreatic cyst > 2 cm.  Can you please schedule OV.  Thanks.

## 2021-12-05 NOTE — Telephone Encounter (Signed)
Agree, this is not an urgent appointment, and October is perfectly reasonable.

## 2021-12-06 ENCOUNTER — Telehealth: Payer: Self-pay | Admitting: Family Medicine

## 2021-12-06 NOTE — Telephone Encounter (Signed)
OK to to just follow lesion with MR at 6 moths.  GI has offered to help [place].  I know they reached out to him.

## 2021-12-06 NOTE — Telephone Encounter (Signed)
Ashaad advised of recommendations.

## 2021-12-06 NOTE — Telephone Encounter (Addendum)
Gave pt Dr. Frankey Shown message and pt requested that message be sent to his mychart. Mychart message sent to pt. Pt scheduled for appointment on 02/01/22 at 9:40 am. Pt verbalized understanding.

## 2021-12-12 ENCOUNTER — Other Ambulatory Visit: Payer: Self-pay | Admitting: Sports Medicine

## 2021-12-12 DIAGNOSIS — M48061 Spinal stenosis, lumbar region without neurogenic claudication: Secondary | ICD-10-CM

## 2021-12-14 DIAGNOSIS — M5441 Lumbago with sciatica, right side: Secondary | ICD-10-CM | POA: Diagnosis not present

## 2021-12-14 DIAGNOSIS — M546 Pain in thoracic spine: Secondary | ICD-10-CM | POA: Diagnosis not present

## 2021-12-14 DIAGNOSIS — M542 Cervicalgia: Secondary | ICD-10-CM | POA: Diagnosis not present

## 2021-12-21 DIAGNOSIS — D239 Other benign neoplasm of skin, unspecified: Secondary | ICD-10-CM | POA: Diagnosis not present

## 2021-12-21 DIAGNOSIS — L821 Other seborrheic keratosis: Secondary | ICD-10-CM | POA: Diagnosis not present

## 2021-12-21 DIAGNOSIS — L570 Actinic keratosis: Secondary | ICD-10-CM | POA: Diagnosis not present

## 2021-12-21 DIAGNOSIS — L918 Other hypertrophic disorders of the skin: Secondary | ICD-10-CM | POA: Diagnosis not present

## 2021-12-25 ENCOUNTER — Other Ambulatory Visit: Payer: Self-pay | Admitting: Sports Medicine

## 2021-12-25 DIAGNOSIS — E781 Pure hyperglyceridemia: Secondary | ICD-10-CM

## 2021-12-25 DIAGNOSIS — J4530 Mild persistent asthma, uncomplicated: Secondary | ICD-10-CM

## 2022-01-02 DIAGNOSIS — G4710 Hypersomnia, unspecified: Secondary | ICD-10-CM | POA: Diagnosis not present

## 2022-01-02 DIAGNOSIS — Z23 Encounter for immunization: Secondary | ICD-10-CM | POA: Diagnosis not present

## 2022-01-15 ENCOUNTER — Other Ambulatory Visit: Payer: Self-pay | Admitting: Sports Medicine

## 2022-01-15 DIAGNOSIS — E781 Pure hyperglyceridemia: Secondary | ICD-10-CM

## 2022-01-18 DIAGNOSIS — M542 Cervicalgia: Secondary | ICD-10-CM | POA: Diagnosis not present

## 2022-01-18 DIAGNOSIS — M546 Pain in thoracic spine: Secondary | ICD-10-CM | POA: Diagnosis not present

## 2022-01-18 DIAGNOSIS — M5441 Lumbago with sciatica, right side: Secondary | ICD-10-CM | POA: Diagnosis not present

## 2022-01-24 ENCOUNTER — Telehealth: Payer: Self-pay

## 2022-01-24 NOTE — Telephone Encounter (Signed)
Initiated Prior authorization [id number] 1GM capsules Via: Covermymeds Case/Key:[id number] Status: approved as of 01/24/22 Reason:: Effective from 01/22/2022 through 01/21/2023. Notified Pt via: Mychart

## 2022-02-01 ENCOUNTER — Ambulatory Visit: Payer: BC Managed Care – PPO | Admitting: Gastroenterology

## 2022-02-13 DIAGNOSIS — G4733 Obstructive sleep apnea (adult) (pediatric): Secondary | ICD-10-CM | POA: Diagnosis not present

## 2022-02-14 DIAGNOSIS — G4733 Obstructive sleep apnea (adult) (pediatric): Secondary | ICD-10-CM | POA: Diagnosis not present

## 2022-02-14 DIAGNOSIS — G4710 Hypersomnia, unspecified: Secondary | ICD-10-CM | POA: Diagnosis not present

## 2022-02-20 ENCOUNTER — Encounter: Payer: Self-pay | Admitting: Sports Medicine

## 2022-02-20 ENCOUNTER — Ambulatory Visit (INDEPENDENT_AMBULATORY_CARE_PROVIDER_SITE_OTHER): Payer: BC Managed Care – PPO | Admitting: Sports Medicine

## 2022-02-20 VITALS — BP 151/97 | HR 76 | Wt 203.0 lb

## 2022-02-20 DIAGNOSIS — K862 Cyst of pancreas: Secondary | ICD-10-CM | POA: Diagnosis not present

## 2022-02-20 DIAGNOSIS — Z0000 Encounter for general adult medical examination without abnormal findings: Secondary | ICD-10-CM

## 2022-02-20 DIAGNOSIS — R21 Rash and other nonspecific skin eruption: Secondary | ICD-10-CM

## 2022-02-20 DIAGNOSIS — E782 Mixed hyperlipidemia: Secondary | ICD-10-CM

## 2022-02-20 MED ORDER — CLOTRIMAZOLE-BETAMETHASONE 1-0.05 % EX CREA: 1.0000 | TOPICAL_CREAM | Freq: Two times a day (BID) | CUTANEOUS | 0 refills | Status: AC

## 2022-02-20 NOTE — Assessment & Plan Note (Signed)
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 I am going to go and pull the trigger for a CA 19-9 today.

## 2022-02-20 NOTE — Progress Notes (Addendum)
    Procedures performed today:    None.  Independent interpretation of notes and tests performed by another provider:   None.  Brief History, Exam, Impression, and Recommendations:    Mixed hyperlipidemia This hyperlipidemia, lipids are still elevated, continues on Vascepa, Zetia, fenofibrate, did not start rosuvastatin, rechecking lipids today.   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 I am going to go and pull the trigger for a CA 19-9 today.  Skin rash Itchy rash across the chest, right axilla, popliteal fossa. Adding topical Lotrisone for now.  Annual physical exam Up-to-date on screening measures including flu shot.    ____________________________________________ Gwen Her. Dianah Field, M.D., ABFM., CAQSM., AME. Primary Care and Sports Medicine [facility]  Adjunct Professor of [place] [employer] [facility] of Medicine  Risk manager

## 2022-02-20 NOTE — Assessment & Plan Note (Signed)
This hyperlipidemia, lipids are still elevated, continues on Vascepa, Zetia, fenofibrate, did not start rosuvastatin, rechecking lipids today.

## 2022-02-20 NOTE — Addendum Note (Signed)
Addended by: Silverio Decamp on: 02/20/2022 10:00 AM   Modules accepted: Orders

## 2022-02-20 NOTE — Assessment & Plan Note (Signed)
Itchy rash across the chest, right axilla, popliteal fossa. Adding topical Lotrisone for now.

## 2022-02-20 NOTE — Assessment & Plan Note (Signed)
Up-to-date on screening measures including flu shot.

## 2022-02-21 LAB — COMPLETE METABOLIC PANEL WITH GFR
AG Ratio: 1.8 (calc) (ref 1.0–2.5)
ALT: 81 U/L — ABNORMAL HIGH (ref 9–46)
AST: 70 U/L — ABNORMAL HIGH (ref 10–35)
Albumin: 5.1 g/dL (ref 3.6–5.1)
Alkaline phosphatase (APISO): 47 U/L (ref 35–144)
BUN: 22 mg/dL (ref 7–25)
CO2: 33 mmol/L — ABNORMAL HIGH (ref 20–32)
Calcium: 10.6 mg/dL — ABNORMAL HIGH (ref 8.6–10.3)
Chloride: 102 mmol/L (ref 98–110)
Creat: 0.84 mg/dL (ref 0.70–1.30)
Globulin: 2.8 g/dL (calc) (ref 1.9–3.7)
Glucose, Bld: 103 mg/dL — ABNORMAL HIGH (ref 65–99)
Potassium: 3.7 mmol/L (ref 3.5–5.3)
Sodium: 145 mmol/L (ref 135–146)
Total Bilirubin: 1.4 mg/dL — ABNORMAL HIGH (ref 0.2–1.2)
Total Protein: 7.9 g/dL (ref 6.1–8.1)
eGFR: 102 mL/min/{1.73_m2} (ref >=60)

## 2022-02-21 LAB — LIPID PANEL
Cholesterol: 229 mg/dL — ABNORMAL HIGH (ref <200)
HDL: 47 mg/dL (ref >=40)
LDL Cholesterol (Calc): 143 mg/dL (calc) — ABNORMAL HIGH
Non-HDL Cholesterol (Calc): 182 mg/dL (calc) — ABNORMAL HIGH (ref <130)
Total CHOL/HDL Ratio: 4.9 (calc) (ref <5.0)
Triglycerides: 239 mg/dL — ABNORMAL HIGH (ref <150)

## 2022-02-21 LAB — CANCER ANTIGEN 19-9: CA 19-9: 25 U/mL (ref <34)

## 2022-02-27 ENCOUNTER — Ambulatory Visit (INDEPENDENT_AMBULATORY_CARE_PROVIDER_SITE_OTHER): Payer: BC Managed Care – PPO

## 2022-02-27 DIAGNOSIS — K760 Fatty (change of) liver, not elsewhere classified: Secondary | ICD-10-CM | POA: Diagnosis not present

## 2022-02-27 DIAGNOSIS — D1800 Hemangioma unspecified site: Secondary | ICD-10-CM | POA: Diagnosis not present

## 2022-02-27 DIAGNOSIS — R935 Abnormal findings on diagnostic imaging of other abdominal regions, including retroperitoneum: Secondary | ICD-10-CM | POA: Diagnosis not present

## 2022-02-27 DIAGNOSIS — K862 Cyst of pancreas: Secondary | ICD-10-CM | POA: Diagnosis not present

## 2022-02-27 MED ORDER — GADOBUTROL 1 MMOL/ML IV SOLN
10.0000 mL | Freq: Once | INTRAVENOUS | Status: AC | PRN
  Administered 2022-02-27: 10 mL via INTRAVENOUS

## 2022-03-01 DIAGNOSIS — G4710 Hypersomnia, unspecified: Secondary | ICD-10-CM | POA: Diagnosis not present

## 2022-03-01 DIAGNOSIS — R0683 Snoring: Secondary | ICD-10-CM | POA: Diagnosis not present

## 2022-03-01 DIAGNOSIS — G4733 Obstructive sleep apnea (adult) (pediatric): Secondary | ICD-10-CM | POA: Diagnosis not present

## 2022-03-22 ENCOUNTER — Other Ambulatory Visit: Payer: Self-pay | Admitting: Sports Medicine

## 2022-03-27 ENCOUNTER — Other Ambulatory Visit: Payer: Self-pay | Admitting: Sports Medicine

## 2022-03-27 DIAGNOSIS — H35363 Drusen (degenerative) of macula, bilateral: Secondary | ICD-10-CM | POA: Diagnosis not present

## 2022-03-27 DIAGNOSIS — H31011 Macula scars of posterior pole (postinflammatory) (post-traumatic), right eye: Secondary | ICD-10-CM | POA: Diagnosis not present

## 2022-03-27 DIAGNOSIS — H33312 Horseshoe tear of retina without detachment, left eye: Secondary | ICD-10-CM | POA: Diagnosis not present

## 2022-03-27 DIAGNOSIS — E781 Pure hyperglyceridemia: Secondary | ICD-10-CM

## 2022-03-27 DIAGNOSIS — H35723 Serous detachment of retinal pigment epithelium, bilateral: Secondary | ICD-10-CM | POA: Diagnosis not present

## 2022-03-27 DIAGNOSIS — H31092 Other chorioretinal scars, left eye: Secondary | ICD-10-CM | POA: Diagnosis not present

## 2022-03-29 DIAGNOSIS — M5441 Lumbago with sciatica, right side: Secondary | ICD-10-CM | POA: Diagnosis not present

## 2022-03-29 DIAGNOSIS — M546 Pain in thoracic spine: Secondary | ICD-10-CM | POA: Diagnosis not present

## 2022-03-29 DIAGNOSIS — M542 Cervicalgia: Secondary | ICD-10-CM | POA: Diagnosis not present

## 2022-04-26 ENCOUNTER — Ambulatory Visit (INDEPENDENT_AMBULATORY_CARE_PROVIDER_SITE_OTHER): Payer: BC Managed Care – PPO | Admitting: Sports Medicine

## 2022-04-26 ENCOUNTER — Encounter: Payer: Self-pay | Admitting: Sports Medicine

## 2022-04-26 VITALS — BP 142/86 | HR 72 | Wt 201.0 lb

## 2022-04-26 DIAGNOSIS — M5136 Other intervertebral disc degeneration, lumbar region: Secondary | ICD-10-CM | POA: Diagnosis not present

## 2022-04-26 DIAGNOSIS — M51369 Other intervertebral disc degeneration, lumbar region without mention of lumbar back pain or lower extremity pain: Secondary | ICD-10-CM

## 2022-04-26 DIAGNOSIS — M48061 Spinal stenosis, lumbar region without neurogenic claudication: Secondary | ICD-10-CM | POA: Diagnosis not present

## 2022-04-26 DIAGNOSIS — L02415 Cutaneous abscess of right lower limb: Secondary | ICD-10-CM

## 2022-04-26 DIAGNOSIS — K862 Cyst of pancreas: Secondary | ICD-10-CM

## 2022-04-26 MED ORDER — DOXYCYCLINE HYCLATE 100 MG PO TABS: 100.0000 mg | ORAL_TABLET | Freq: Two times a day (BID) | ORAL | 0 refills | Status: AC

## 2022-04-26 NOTE — Assessment & Plan Note (Addendum)
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 His MRI is about 57 years old so we will get a new one.

## 2022-04-26 NOTE — Progress Notes (Signed)
    Procedures performed today:    None.  Independent interpretation of notes and tests performed by another provider:   None.  Brief History, Exam, Impression, and Recommendations:    Abscess of right thigh Noted a painful warm reddish lesion right inner thigh. On exam it appears to be skin abscess, not fluctuant, adding doxycycline, he will do warm compresses.  Spinal stenosis of lumbar region [place] has multilevel lumbar spinal stenosis, he had a lumbar epidural sometime ago, he would like a repeat lumbar epidural, he did not seem to respond all that well from facet injections.. His MRI is about 57 years old so we will get a new one.  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 CA 19-9 was low. I did want him to see a gastroenterologist but he declines.   I will go ahead and order his follow-up MRCP to be done next month.    ____________________________________________ Gwen Her. Dianah Field, M.D., ABFM., CAQSM., AME. Primary Care and Sports Medicine [facility]  Adjunct Professor of [place] of [facility] of Medicine  Risk manager

## 2022-04-26 NOTE — Assessment & Plan Note (Signed)
Noted a painful warm reddish lesion right inner thigh. On exam it appears to be skin abscess, not fluctuant, adding doxycycline, he will do warm compresses.

## 2022-04-26 NOTE — Assessment & Plan Note (Signed)
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 CA 19-9 was low. I did want him to see a gastroenterologist but he declines.   I will go ahead and order his follow-up MRCP to be done next month.

## 2022-05-03 ENCOUNTER — Inpatient Hospital Stay: Admission: RE | Admit: 2022-05-03 | Payer: BC Managed Care – PPO | Source: Ambulatory Visit

## 2022-05-08 ENCOUNTER — Ambulatory Visit (INDEPENDENT_AMBULATORY_CARE_PROVIDER_SITE_OTHER): Payer: BC Managed Care – PPO

## 2022-05-08 DIAGNOSIS — M545 Low back pain, unspecified: Secondary | ICD-10-CM | POA: Diagnosis not present

## 2022-05-08 DIAGNOSIS — M48061 Spinal stenosis, lumbar region without neurogenic claudication: Secondary | ICD-10-CM | POA: Diagnosis not present

## 2022-05-08 DIAGNOSIS — R202 Paresthesia of skin: Secondary | ICD-10-CM | POA: Diagnosis not present

## 2022-05-09 DIAGNOSIS — S61200A Unspecified open wound of right index finger without damage to nail, initial encounter: Secondary | ICD-10-CM | POA: Diagnosis not present

## 2022-05-10 ENCOUNTER — Ambulatory Visit
Admission: RE | Admit: 2022-05-10 | Discharge: 2022-05-10 | Disposition: A | Discharge status: Home / Self Care | Payer: BC Managed Care – PPO | Source: Ambulatory Visit | Attending: Sports Medicine | Admitting: Sports Medicine

## 2022-05-10 DIAGNOSIS — M5136 Other intervertebral disc degeneration, lumbar region: Secondary | ICD-10-CM

## 2022-05-10 DIAGNOSIS — M47817 Spondylosis without myelopathy or radiculopathy, lumbosacral region: Secondary | ICD-10-CM | POA: Diagnosis not present

## 2022-05-10 MED ORDER — IOPAMIDOL (ISOVUE-M 200) INJECTION 41%
1.0000 mL | Freq: Once | INTRAMUSCULAR | Status: AC
  Administered 2022-05-10: 1 mL via EPIDURAL

## 2022-05-10 MED ORDER — METHYLPREDNISOLONE ACETATE 40 MG/ML INJ SUSP (RADIOLOG
80.0000 mg | Freq: Once | INTRAMUSCULAR | Status: AC
  Administered 2022-05-10: 80 mg via EPIDURAL

## 2022-05-10 NOTE — Discharge Instructions (Signed)

## 2022-05-15 ENCOUNTER — Other Ambulatory Visit: Payer: Self-pay | Admitting: Sports Medicine

## 2022-05-15 DIAGNOSIS — L719 Rosacea, unspecified: Secondary | ICD-10-CM

## 2022-05-15 NOTE — Telephone Encounter (Signed)
Last prescribed 2 years ago. Is refill appropriate? Thanks in advance.

## 2022-05-19 DIAGNOSIS — Z4802 Encounter for removal of sutures: Secondary | ICD-10-CM | POA: Diagnosis not present

## 2022-06-05 ENCOUNTER — Ambulatory Visit (INDEPENDENT_AMBULATORY_CARE_PROVIDER_SITE_OTHER): Payer: BC Managed Care – PPO

## 2022-06-05 DIAGNOSIS — K862 Cyst of pancreas: Secondary | ICD-10-CM | POA: Diagnosis not present

## 2022-06-05 DIAGNOSIS — K760 Fatty (change of) liver, not elsewhere classified: Secondary | ICD-10-CM | POA: Diagnosis not present

## 2022-06-05 DIAGNOSIS — R935 Abnormal findings on diagnostic imaging of other abdominal regions, including retroperitoneum: Secondary | ICD-10-CM | POA: Diagnosis not present

## 2022-06-05 MED ORDER — GADOBUTROL 1 MMOL/ML IV SOLN
10.0000 mL | Freq: Once | INTRAVENOUS | Status: AC | PRN
  Administered 2022-06-05: 10 mL via INTRAVENOUS

## 2022-06-06 ENCOUNTER — Encounter: Payer: Self-pay | Admitting: Sports Medicine

## 2022-06-06 DIAGNOSIS — K862 Cyst of pancreas: Secondary | ICD-10-CM

## 2022-06-19 ENCOUNTER — Telehealth: Payer: Self-pay

## 2022-06-19 NOTE — Telephone Encounter (Signed)
Patient left VM and states he still has the abscess on his thigh he states its not painful but wants to know if he has any other options.

## 2022-06-20 ENCOUNTER — Other Ambulatory Visit: Payer: Self-pay | Admitting: Sports Medicine

## 2022-06-20 DIAGNOSIS — E782 Mixed hyperlipidemia: Secondary | ICD-10-CM

## 2022-06-20 MED ORDER — SULFAMETHOXAZOLE-TRIMETHOPRIM 800-160 MG PO TABS: 1.0000 | ORAL_TABLET | Freq: Two times a day (BID) | ORAL | 0 refills | Status: DC

## 2022-06-20 NOTE — Telephone Encounter (Signed)
If doxycycline was ineffective we can do a course of Septra, if this does not work I just need to look at it.

## 2022-06-20 NOTE — Addendum Note (Signed)
Addended by: Silverio Decamp on: 06/20/2022 03:05 PM   Modules accepted: Orders

## 2022-07-04 ENCOUNTER — Encounter: Payer: Self-pay | Admitting: Medical-Surgical

## 2022-07-04 ENCOUNTER — Ambulatory Visit (INDEPENDENT_AMBULATORY_CARE_PROVIDER_SITE_OTHER): Payer: BC Managed Care – PPO

## 2022-07-04 ENCOUNTER — Ambulatory Visit (INDEPENDENT_AMBULATORY_CARE_PROVIDER_SITE_OTHER): Payer: BC Managed Care – PPO | Admitting: Medical-Surgical

## 2022-07-04 VITALS — BP 136/77 | HR 67 | Temp 98.4°F | Resp 20 | Ht 70.0 in | Wt 202.1 lb

## 2022-07-04 DIAGNOSIS — R918 Other nonspecific abnormal finding of lung field: Secondary | ICD-10-CM | POA: Diagnosis not present

## 2022-07-04 DIAGNOSIS — R051 Acute cough: Secondary | ICD-10-CM | POA: Diagnosis not present

## 2022-07-04 DIAGNOSIS — J40 Bronchitis, not specified as acute or chronic: Secondary | ICD-10-CM

## 2022-07-04 DIAGNOSIS — J329 Chronic sinusitis, unspecified: Secondary | ICD-10-CM | POA: Diagnosis not present

## 2022-07-04 DIAGNOSIS — R0989 Other specified symptoms and signs involving the circulatory and respiratory systems: Secondary | ICD-10-CM | POA: Diagnosis not present

## 2022-07-04 LAB — POCT INFLUENZA A/B
Influenza A, POC: NEGATIVE
Influenza B, POC: NEGATIVE

## 2022-07-04 LAB — POC COVID19 BINAXNOW: SARS Coronavirus 2 Ag: NEGATIVE

## 2022-07-04 MED ORDER — AZITHROMYCIN 250 MG PO TABS: ORAL_TABLET | ORAL | 0 refills | Status: AC

## 2022-07-04 NOTE — Progress Notes (Signed)
Established Patient Office Visit  Subjective   Patient ID: Gabriel Wong, male   DOB: 1966-01-07 Age: 57 y.o. MRN: [id number]   Chief Complaint  Patient presents with   Cough   Generalized Body Aches   Sore Throat   HPI Pleasant 57 year old male presenting today with reports of cough, chest congestion, sinus congestions, body aches, head pressure, sore throat, and sinus pressure.  Notes that his symptoms started last Monday and since then the head pressure has gotten better and the sore throat is gone.  Does still have sinus congestion and chest congestion.  Since he had pneumonia last year, he is worried about the chest congestion.  He did not follow through with the last chest x-ray that was recommended so is not sure that he ever truly cleared the fluid in his lungs.  He did miss work on Friday last week and Monday through today.  Thinks may be allergies may be contributing to his symptoms.  Denies fever, chills, GI upset.   Objective:    Vitals:   07/04/22 0925  BP: 136/77  Pulse: 67  Temp: 98.4 F (36.9 C)  Resp: 20  Height: 5\' 10"  (1.778 m)  Weight: 202 lb 1.9 oz (91.7 kg)  SpO2: 97%  BMI (Calculated): 29    Physical Exam Vitals reviewed.  Constitutional:      General: He is not in acute distress.    Appearance: Normal appearance. He is not ill-appearing.  HENT:     Head: Normocephalic.  Cardiovascular:     Rate and Rhythm: Normal rate.     Pulses: Normal pulses.     Heart sounds: Normal heart sounds. No murmur heard.    No friction rub. No gallop.  Pulmonary:     Effort: Pulmonary effort is normal. No respiratory distress.     Breath sounds: Normal breath sounds.  Skin:    General: Skin is warm and dry.  Neurological:     Mental Status: He is alert and oriented to person, place, and time.  Psychiatric:        Mood and Affect: Mood normal.        Behavior: Behavior normal.        Thought Content: Thought content normal.        Judgment: Judgment normal.     Results for orders placed or performed in visit on 07/04/22 (from the past 24 hour(s))  POCT Influenza A/B     Status: Normal   Collection Time: 07/04/22  9:45 AM  Result Value Ref Range   Influenza A, POC Negative Negative   Influenza B, POC Negative Negative  POC COVID-19     Status: Normal   Collection Time: 07/04/22 10:06 AM  Result Value Ref Range   SARS Coronavirus 2 Ag Negative Negative       The 10-year ASCVD risk score (Arnett DK, et al., 2019) is: 17.9%   Values used to calculate the score:     Age: 44 years     Sex: Male     Is Non-Hispanic African American: No     Diabetic: No     Tobacco smoker: Yes     Systolic Blood Pressure: [id number] mmHg     Is BP treated: Yes     HDL Cholesterol: 47 mg/dL     Total Cholesterol: 229 mg/dL   Assessment & Plan:   1. Acute cough POCT flu and COVID-negative.  Exam showed clear lung fields throughout however with significant chest congestion  and history of pneumonia, we will go ahead and update a chest x-ray today. - DG Chest 2 View; Future - POCT Influenza A/B - POC COVID-19  2. Sinobronchitis Suspect symptoms were related to a viral upper respiratory infection however with 7 to 8 days of symptoms that seem to worsen yesterday, we will treat for sinobronchitis with azithromycin.  Recommend continuing conservative measures at home. - azithromycin (ZITHROMAX) 250 MG tablet; Take 2 tablets on day 1, then 1 tablet daily on days 2 through 5  Dispense: 6 tablet; Refill: 0 - POCT Influenza A/B - POC COVID-19  Return if symptoms worsen or fail to improve.  ___________________________________________ Clearnce Sorrel, DNP, APRN, FNP-BC Primary Care and [place]

## 2022-07-06 ENCOUNTER — Encounter: Payer: Self-pay | Admitting: Medical-Surgical

## 2022-07-06 NOTE — Telephone Encounter (Signed)
I responded to him.  I'm sorry about his interactions with you, Joy.

## 2022-07-10 DIAGNOSIS — M9902 Segmental and somatic dysfunction of thoracic region: Secondary | ICD-10-CM | POA: Diagnosis not present

## 2022-07-10 DIAGNOSIS — M5417 Radiculopathy, lumbosacral region: Secondary | ICD-10-CM | POA: Diagnosis not present

## 2022-07-10 DIAGNOSIS — M9903 Segmental and somatic dysfunction of lumbar region: Secondary | ICD-10-CM | POA: Diagnosis not present

## 2022-07-10 DIAGNOSIS — M9904 Segmental and somatic dysfunction of sacral region: Secondary | ICD-10-CM | POA: Diagnosis not present

## 2022-07-12 ENCOUNTER — Encounter (INDEPENDENT_AMBULATORY_CARE_PROVIDER_SITE_OTHER): Payer: BC Managed Care – PPO | Admitting: Sports Medicine

## 2022-07-12 DIAGNOSIS — R058 Other specified cough: Secondary | ICD-10-CM | POA: Diagnosis not present

## 2022-07-12 DIAGNOSIS — J432 Centrilobular emphysema: Secondary | ICD-10-CM

## 2022-07-13 NOTE — Telephone Encounter (Signed)
I spent 5 total minutes of online digital evaluation and management services in this patient-initiated request for online care. 

## 2022-07-16 MED ORDER — BENZONATATE 200 MG PO CAPS: 200.0000 mg | ORAL_CAPSULE | Freq: Three times a day (TID) | ORAL | 0 refills | Status: AC | PRN

## 2022-07-16 NOTE — Addendum Note (Signed)
Addended by: Silverio Decamp on: 07/16/2022 11:43 AM   Modules accepted: Orders

## 2022-07-24 IMAGING — CT CT CHEST LUNG CANCER SCREENING LOW DOSE W/O CM
2 of 4 series · 15 of 36 positions shown, 18 images · non-contrast
Comparison: None.

CLINICAL DATA: Current smoker, 20 pack-year history.

EXAM:
CT CHEST WITHOUT CONTRAST LOW-DOSE FOR LUNG CANCER SCREENING
TECHNIQUE: Multidetector CT imaging of the chest was performed following the
standard protocol without IV contrast.

[Series 3: coronal · coronal · 0.59mm/px · 3 of 305 slices shown]
[im 61/305  lung]
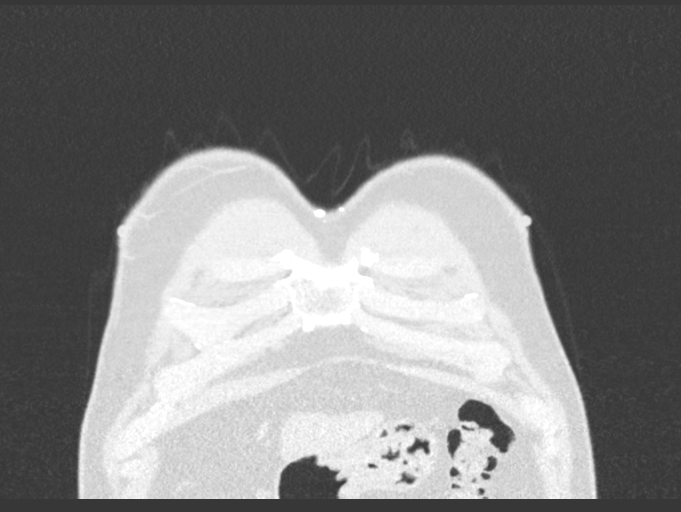
[im 122/305  lung]
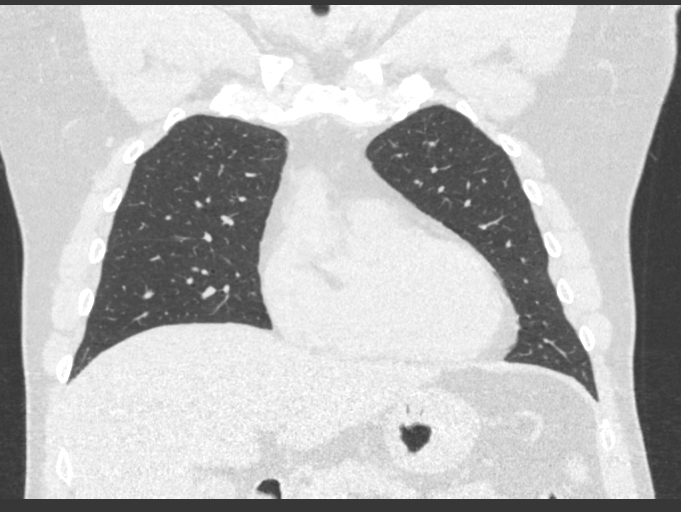
[im 183/305  lung]
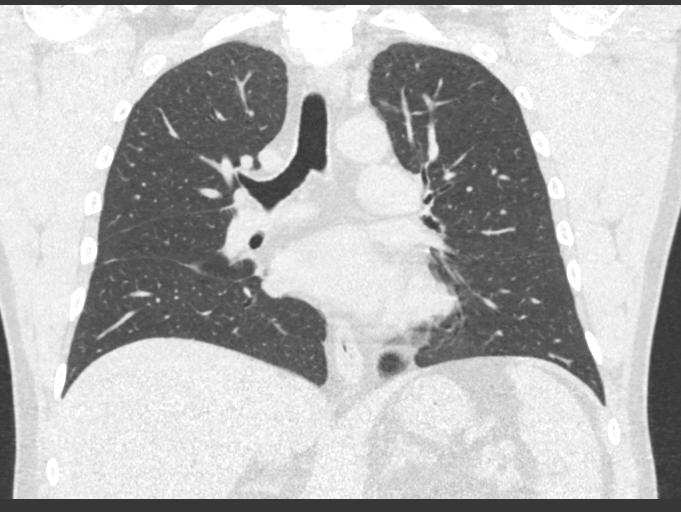

[Series 6: lungs · axial · 0.80mm/px · z∈[+937,+1181]mm · 12 of 270 slices shown, 15 images]
[im 13/270  mediastinal]
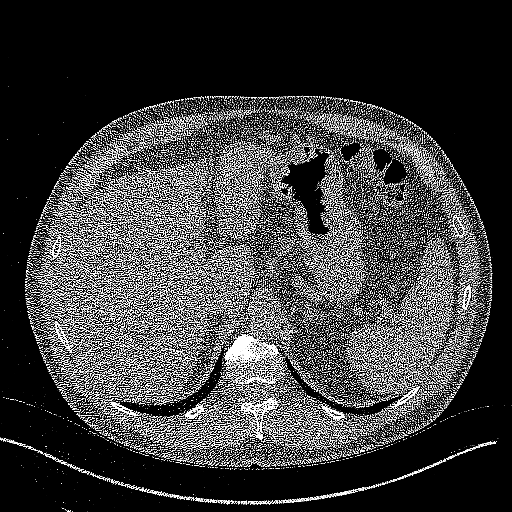
[im 13/270  lung]
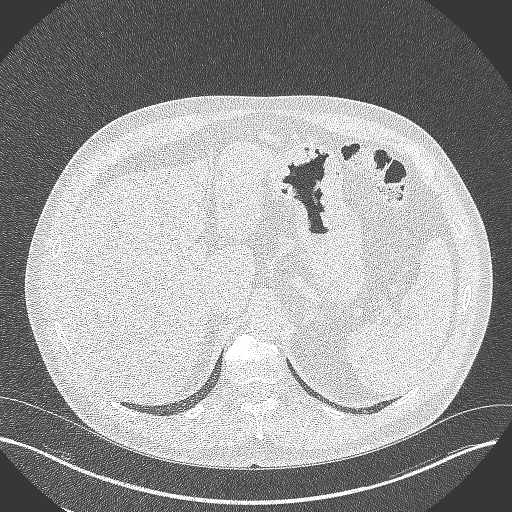
[im 37/270  lung]
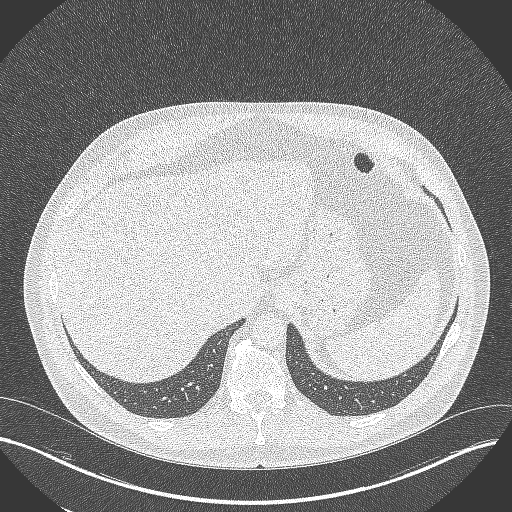
[im 62/270  lung]
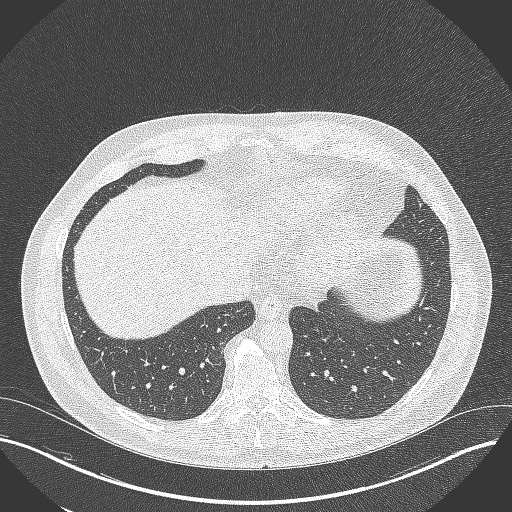
[im 86/270  lung]
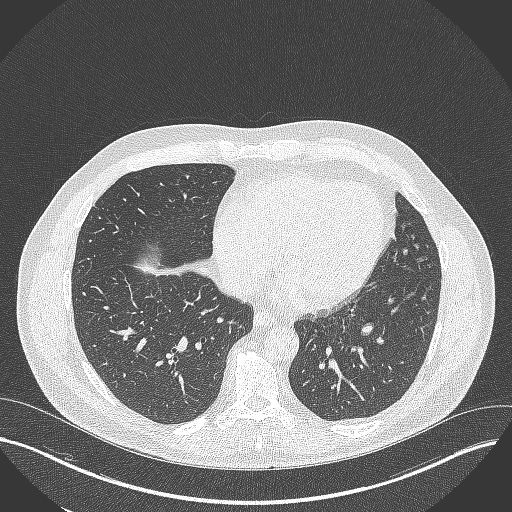
[im 98/270  mediastinal]
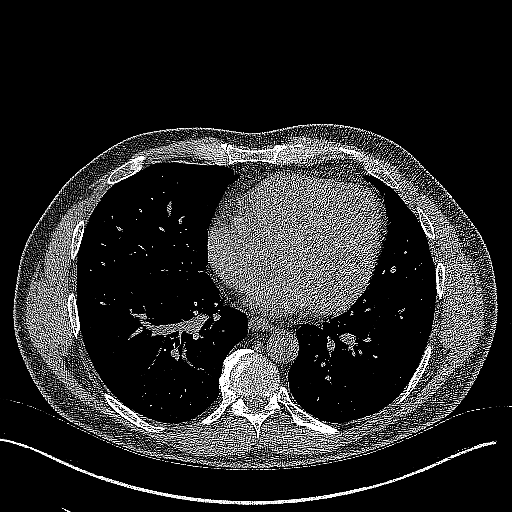
[im 98/270  lung]
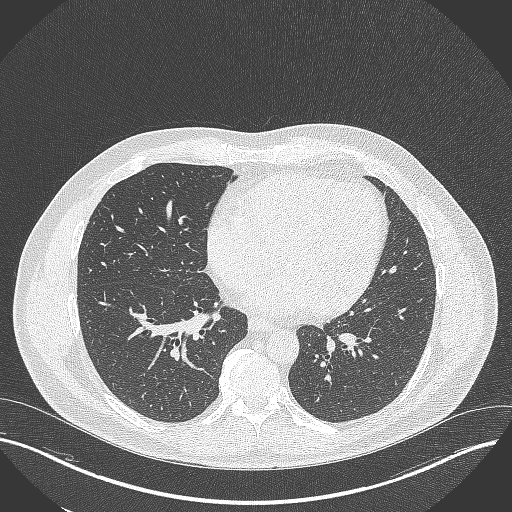
[im 123/270  lung]
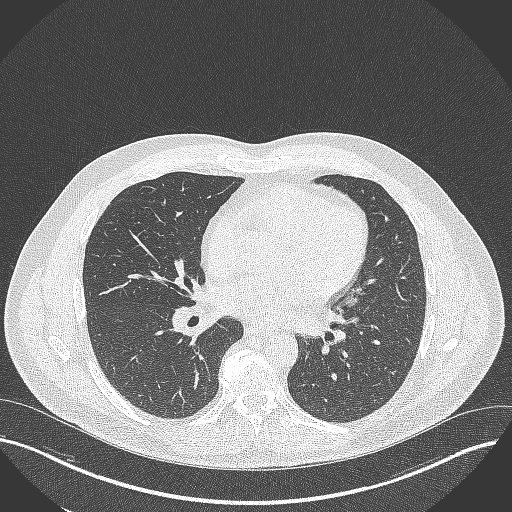
[im 147/270  lung]
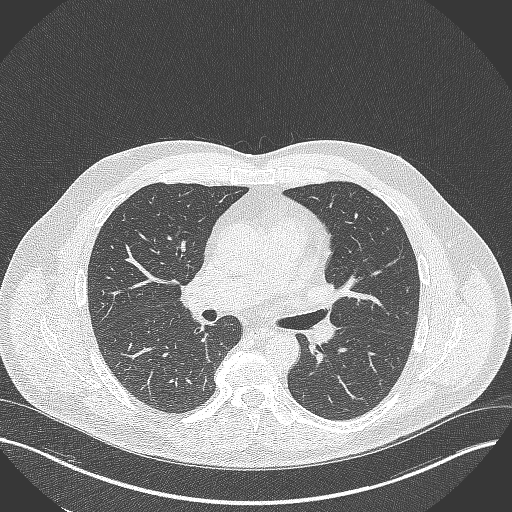
[im 172/270  lung]
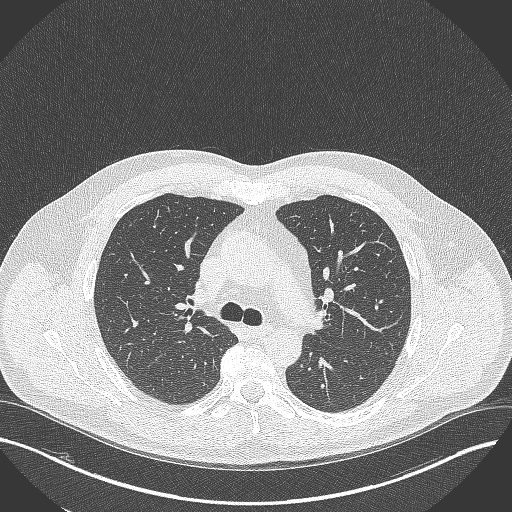
[im 184/270  mediastinal]
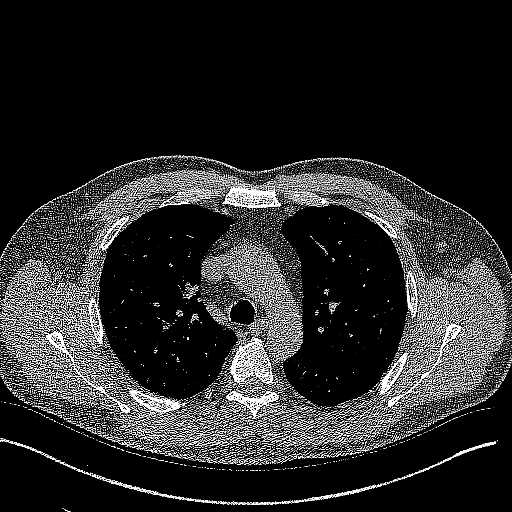
[im 184/270  lung]
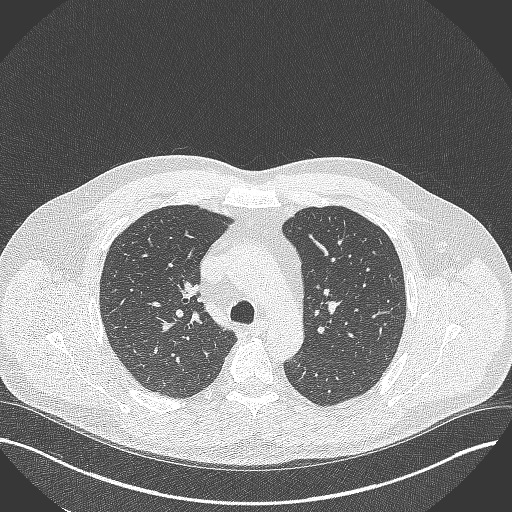
[im 208/270  lung]
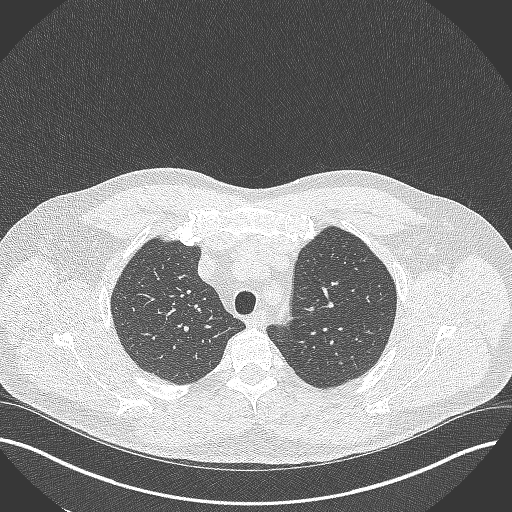
[im 233/270  lung]
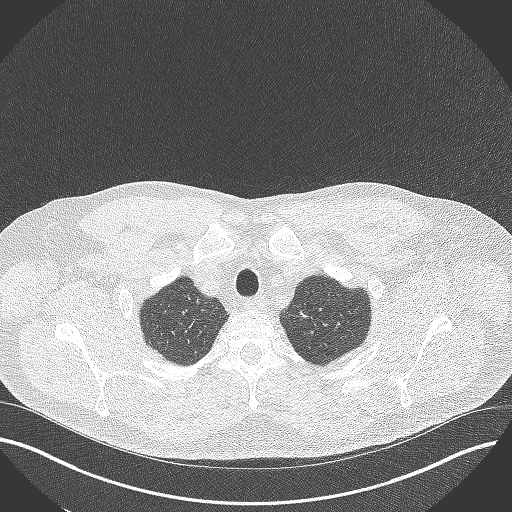
[im 257/270  lung]
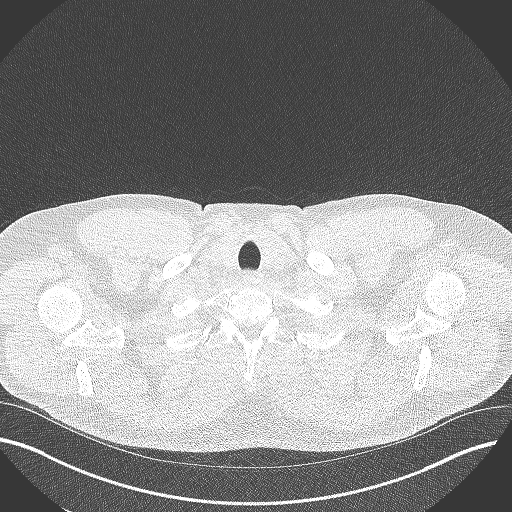

[15 of 36 positions shown; findings below may reference images not displayed]

FINDINGS: Cardiovascular: Aortic valvular calcification. Heart is at the upper
limits of normal in size to mildly enlarged. No pericardial
effusion.

Mediastinum/Nodes: No pathologically enlarged mediastinal or
axillary lymph nodes. Hilar regions are difficult to definitively
evaluate without IV contrast but appear grossly unremarkable.
Esophagus is grossly unremarkable.

Lungs/Pleura: Centrilobular emphysema. Smoking related respiratory
bronchiolitis. 4.7 mm perifissural lymph node along the minor
fissure. No suspicious pulmonary nodules. No pleural fluid. Airway
is unremarkable.

Upper Abdomen: Visualized portions of the liver, gallbladder,
adrenal glands, left kidney, spleen, pancreas, stomach and bowel are
grossly unremarkable.

Musculoskeletal: Degenerative changes in the spine. No worrisome
lytic or sclerotic lesions.
IMPRESSION: 1. Lung-RADS 2, benign appearance or behavior. Continue annual
screening with low-dose chest CT without contrast in 12 months.
2.  Emphysema ([id number]).

## 2022-08-07 DIAGNOSIS — H33312 Horseshoe tear of retina without detachment, left eye: Secondary | ICD-10-CM | POA: Diagnosis not present

## 2022-08-07 DIAGNOSIS — H43392 Other vitreous opacities, left eye: Secondary | ICD-10-CM | POA: Diagnosis not present

## 2022-08-07 DIAGNOSIS — H31092 Other chorioretinal scars, left eye: Secondary | ICD-10-CM | POA: Diagnosis not present

## 2022-08-07 DIAGNOSIS — H35723 Serous detachment of retinal pigment epithelium, bilateral: Secondary | ICD-10-CM | POA: Diagnosis not present

## 2022-10-03 ENCOUNTER — Other Ambulatory Visit: Payer: Self-pay | Admitting: Sports Medicine

## 2022-10-03 DIAGNOSIS — E781 Pure hyperglyceridemia: Secondary | ICD-10-CM

## 2022-10-23 DIAGNOSIS — L718 Other rosacea: Secondary | ICD-10-CM | POA: Diagnosis not present

## 2022-10-23 DIAGNOSIS — L2089 Other atopic dermatitis: Secondary | ICD-10-CM | POA: Diagnosis not present

## 2022-10-23 DIAGNOSIS — L400 Psoriasis vulgaris: Secondary | ICD-10-CM | POA: Diagnosis not present

## 2022-11-07 ENCOUNTER — Encounter: Payer: Self-pay | Admitting: Sports Medicine

## 2022-11-23 DIAGNOSIS — M5417 Radiculopathy, lumbosacral region: Secondary | ICD-10-CM | POA: Diagnosis not present

## 2022-11-23 DIAGNOSIS — M9902 Segmental and somatic dysfunction of thoracic region: Secondary | ICD-10-CM | POA: Diagnosis not present

## 2022-11-23 DIAGNOSIS — M9903 Segmental and somatic dysfunction of lumbar region: Secondary | ICD-10-CM | POA: Diagnosis not present

## 2022-11-23 DIAGNOSIS — M9904 Segmental and somatic dysfunction of sacral region: Secondary | ICD-10-CM | POA: Diagnosis not present

## 2022-11-26 ENCOUNTER — Telehealth: Payer: Self-pay | Admitting: Sports Medicine

## 2022-11-26 ENCOUNTER — Encounter: Payer: Self-pay | Admitting: Sports Medicine

## 2022-11-26 DIAGNOSIS — M51369 Other intervertebral disc degeneration, lumbar region without mention of lumbar back pain or lower extremity pain: Secondary | ICD-10-CM

## 2022-11-26 DIAGNOSIS — M5136 Other intervertebral disc degeneration, lumbar region: Secondary | ICD-10-CM

## 2022-11-26 NOTE — Telephone Encounter (Signed)
Orders placed, he should expect a phone call from [facility] imaging, but he can go ahead and call them now and get scheduled since the order is there.

## 2022-11-26 NOTE — Telephone Encounter (Signed)
Patient called he needs an order faxed over to [facility] for his back ASAP the fax number is [phone] and the contact person is Joni Reining [phone]

## 2022-11-27 ENCOUNTER — Encounter: Payer: Self-pay | Admitting: Sports Medicine

## 2022-11-27 ENCOUNTER — Ambulatory Visit (INDEPENDENT_AMBULATORY_CARE_PROVIDER_SITE_OTHER): Payer: BC Managed Care – PPO | Admitting: Sports Medicine

## 2022-11-27 ENCOUNTER — Ambulatory Visit
Admission: RE | Admit: 2022-11-27 | Discharge: 2022-11-27 | Disposition: A | Discharge status: Home / Self Care | Payer: BC Managed Care – PPO | Source: Ambulatory Visit | Attending: Sports Medicine | Admitting: Sports Medicine

## 2022-11-27 VITALS — BP 129/79 | HR 72 | Ht 70.0 in | Wt 198.0 lb

## 2022-11-27 DIAGNOSIS — M5136 Other intervertebral disc degeneration, lumbar region: Secondary | ICD-10-CM

## 2022-11-27 DIAGNOSIS — M1A9XX0 Chronic gout, unspecified, without tophus (tophi): Secondary | ICD-10-CM | POA: Diagnosis not present

## 2022-11-27 DIAGNOSIS — J432 Centrilobular emphysema: Secondary | ICD-10-CM

## 2022-11-27 DIAGNOSIS — Z113 Encounter for screening for infections with a predominantly sexual mode of transmission: Secondary | ICD-10-CM | POA: Diagnosis not present

## 2022-11-27 DIAGNOSIS — I10 Essential (primary) hypertension: Secondary | ICD-10-CM | POA: Diagnosis not present

## 2022-11-27 DIAGNOSIS — Z111 Encounter for screening for respiratory tuberculosis: Secondary | ICD-10-CM

## 2022-11-27 DIAGNOSIS — M48061 Spinal stenosis, lumbar region without neurogenic claudication: Secondary | ICD-10-CM | POA: Diagnosis not present

## 2022-11-27 DIAGNOSIS — Z0000 Encounter for general adult medical examination without abnormal findings: Secondary | ICD-10-CM | POA: Diagnosis not present

## 2022-11-27 DIAGNOSIS — K862 Cyst of pancreas: Secondary | ICD-10-CM

## 2022-11-27 MED ORDER — IOPAMIDOL (ISOVUE-M 200) INJECTION 41%
1.0000 mL | Freq: Once | INTRAMUSCULAR | Status: AC
  Administered 2022-11-27: 1 mL via EPIDURAL

## 2022-11-27 MED ORDER — METHYLPREDNISOLONE ACETATE 40 MG/ML INJ SUSP (RADIOLOG
80.0000 mg | Freq: Once | INTRAMUSCULAR | Status: AC
  Administered 2022-11-27: 80 mg via EPIDURAL

## 2022-11-27 NOTE — Assessment & Plan Note (Addendum)
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 Historically CA 19-9 has been low. He has declined gastroenterology consultation.  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  There are incidentally noted pulmonary nodules on the right that require dedicated chest CT for further evaluation, have him let me know if he is okay with me ordering this CT.

## 2022-11-27 NOTE — Discharge Instructions (Signed)

## 2022-11-27 NOTE — Assessment & Plan Note (Signed)
Fasting annual physical as above. Up-to-date on screening measures. Getting standard STD screening per patient request, tuberculosis screening, he is on [employer]. Return to see me in 1 year.

## 2022-11-27 NOTE — Progress Notes (Addendum)
Subjective:    CC: Annual Physical Exam  HPI:  This patient is here for their annual physical  I reviewed the past medical history, family history, social history, surgical history, and allergies today and no changes were needed.  Please see the problem list section below in epic for further details.  Past Medical History: Past Medical History:  Diagnosis Date   Alcoholic hepatitis    Anxiety    Asthma    Gout 04/28/2015   Hyperlipemia    Hypertension    Insomnia    Rosacea    Seasonal allergies    Past Surgical History: Past Surgical History:  Procedure Laterality Date   BACK SURGERY     CHEST TUBE INSERTION Right 10/13/2021   Procedure: INSERTION PLEURAL DRAINAGE CATHETER;  Surgeon: Josephine Igo, DO;  Location: [facility];  Service: Pulmonary;  Laterality: Right;  52fr pigtail catheter   FISSURECTOMY     IR THORACENTESIS ASP PLEURAL SPACE W/IMG GUIDE  10/12/2021   Social History: Social History   Socioeconomic History   Marital status: Single    Spouse name: Not on file   Number of children: Not on file   Years of education: Not on file   Highest education level: Not on file  Occupational History   Not on file  Tobacco Use   Smoking status: Every Day    Current packs/day: 0.25    Average packs/day: 0.3 packs/day for 25.0 years (6.3 ttl pk-yrs)    Types: Cigarettes, Cigars   Smokeless tobacco: Never  Vaping Use   Vaping status: Never Used  Substance and Sexual Activity   Alcohol use: Yes   Drug use: Yes    Types: Marijuana   Sexual activity: Not on file  Other Topics Concern   Not on file  Social History Narrative   Not on file   Social Determinants of Health   Financial Resource Strain: Not on file  Food Insecurity: Not on file  Transportation Needs: Not on file  Physical Activity: Not on file  Stress: Not on file  Social Connections: Unknown (08/25/2021)   [facility] from [facility]   Social Network    Social Network: Not on  file   Family History: No family history on file. Allergies: Allergies  Allergen Reactions   Lipitor [Atorvastatin] Other (See Comments)    Elevated LFTs   Tylenol [Acetaminophen] Other (See Comments)    Elevated LFTs   Medications: See med rec.  Review of Systems: No headache, visual changes, nausea, vomiting, diarrhea, constipation, dizziness, abdominal pain, skin rash, fevers, chills, night sweats, swollen lymph nodes, weight loss, chest pain, body aches, joint swelling, muscle aches, shortness of breath, mood changes, visual or auditory hallucinations.  Objective:    General: Well Developed, well nourished, and in no acute distress.  Neuro: Alert and oriented x3, extra-ocular muscles intact, sensation grossly intact. Cranial nerves II through XII are intact, motor, sensory, and coordinative functions are all intact. HEENT: Normocephalic, atraumatic, pupils equal round reactive to light, neck supple, no masses, no lymphadenopathy, thyroid nonpalpable. Oropharynx, nasopharynx, external ear canals are unremarkable. Skin: Warm and dry, no rashes noted.  Cardiac: Regular rate and rhythm, no murmurs rubs or gallops.  Respiratory: Clear to auscultation bilaterally. Not using accessory muscles, speaking in full sentences.  Abdominal: Soft, nontender, nondistended, positive bowel sounds, no masses, no organomegaly.  Musculoskeletal: Shoulder, elbow, wrist, hip, knee, ankle stable, and with full range of motion.  Impression and Recommendations:    The  patient was counselled, risk factors were discussed, anticipatory guidance given.  Annual physical exam Fasting annual physical as above. Up-to-date on screening measures. Getting standard STD screening per patient request, tuberculosis screening, he is on [employer]. Return to see me in 1 year.  Pancreas cyst Potential intraductal papillary mucinous neoplasm versus pancreatic pseudocyst on MRCP, he is due for repeat MRCP, he will go ahead  and get this scheduled. Historically CA 19-9 has been low. He has declined gastroenterology consultation.  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  There are incidentally noted pulmonary nodules on the right that require dedicated chest CT for further evaluation, have him let me know if he is okay with me ordering this CT.  Centrilobular emphysema (HCC) Pulmonary nodules essentially noted on abdominal MRI, recommendation for dedicated chest CT. Awaiting patient approval.   ____________________________________________ Gabriel Wong. Benjamin Stain, M.D., ABFM., CAQSM., AME. Primary Care and Sports Medicine [facility]  Adjunct Professor of Family Medicine  [employer]  Restaurant manager, fast food

## 2022-11-29 LAB — GC/CHLAMYDIA PROBE AMP
Chlamydia trachomatis, NAA: NEGATIVE
Neisseria Gonorrhoeae by PCR: NEGATIVE

## 2022-11-30 LAB — LIPID PANEL
Chol/HDL Ratio: 5.1 ratio — ABNORMAL HIGH (ref 0.0–5.0)
Cholesterol, Total: 193 mg/dL (ref 100–199)
HDL: 38 mg/dL — ABNORMAL LOW (ref >39)
LDL Chol Calc (NIH): 133 mg/dL — ABNORMAL HIGH (ref 0–99)
Triglycerides: 120 mg/dL (ref 0–149)
VLDL Cholesterol Cal: 22 mg/dL (ref 5–40)

## 2022-11-30 LAB — CBC
Hematocrit: 44.1 % (ref 37.5–51.0)
Hemoglobin: 15.3 g/dL (ref 13.0–17.7)
MCH: 33.3 pg — ABNORMAL HIGH (ref 26.6–33.0)
MCHC: 34.7 g/dL (ref 31.5–35.7)
MCV: 96 fL (ref 79–97)
Platelets: 240 x10E3/uL (ref 150–450)
RBC: 4.59 x10E6/uL (ref 4.14–5.80)
RDW: 11.7 % (ref 11.6–15.4)
WBC: 4.9 x10E3/uL (ref 3.4–10.8)

## 2022-11-30 LAB — COMPREHENSIVE METABOLIC PANEL WITH GFR
ALT: 92 IU/L — ABNORMAL HIGH (ref 0–44)
AST: 78 IU/L — ABNORMAL HIGH (ref 0–40)
Albumin: 5 g/dL — ABNORMAL HIGH (ref 3.8–4.9)
Alkaline Phosphatase: 49 IU/L (ref 44–121)
BUN/Creatinine Ratio: 20 (ref 9–20)
BUN: 19 mg/dL (ref 6–24)
Bilirubin Total: 0.9 mg/dL (ref 0.0–1.2)
CO2: 23 mmol/L (ref 20–29)
Calcium: 10.3 mg/dL — ABNORMAL HIGH (ref 8.7–10.2)
Chloride: 102 mmol/L (ref 96–106)
Creatinine, Ser: 0.97 mg/dL (ref 0.76–1.27)
Globulin, Total: 2.2 g/dL (ref 1.5–4.5)
Glucose: 98 mg/dL (ref 70–99)
Potassium: 3.6 mmol/L (ref 3.5–5.2)
Sodium: 141 mmol/L (ref 134–144)
Total Protein: 7.2 g/dL (ref 6.0–8.5)
eGFR: 91 mL/min/1.73 (ref >59)

## 2022-11-30 LAB — URIC ACID: Uric Acid: 4.1 mg/dL (ref 3.8–8.4)

## 2022-11-30 LAB — QUANTIFERON-TB GOLD PLUS
QuantiFERON Mitogen Value: >10 [IU]/mL
QuantiFERON Nil Value: 0.01 [IU]/mL
QuantiFERON TB1 Ag Value: 0.02 [IU]/mL
QuantiFERON TB2 Ag Value: 0.02 [IU]/mL
QuantiFERON-TB Gold Plus: NEGATIVE

## 2022-11-30 LAB — HEMOGLOBIN A1C
Est. average glucose Bld gHb Est-mCnc: 103 mg/dL
Hgb A1c MFr Bld: 5.2 % (ref 4.8–5.6)

## 2022-11-30 LAB — ACUTE VIRAL HEPATITIS (HAV, HBV, HCV)
HCV Ab: NONREACTIVE
Hep A IgM: NEGATIVE
Hep B C IgM: NEGATIVE
Hepatitis B Surface Ag: NEGATIVE

## 2022-11-30 LAB — RPR: RPR Ser Ql: NONREACTIVE

## 2022-11-30 LAB — HIV ANTIBODY (ROUTINE TESTING W REFLEX): HIV Screen 4th Generation wRfx: NONREACTIVE

## 2022-11-30 LAB — HCV INTERPRETATION

## 2022-11-30 LAB — TSH: TSH: 2.28 u[IU]/mL (ref 0.450–4.500)

## 2022-12-04 ENCOUNTER — Ambulatory Visit: Payer: BC Managed Care – PPO

## 2022-12-04 DIAGNOSIS — K862 Cyst of pancreas: Secondary | ICD-10-CM

## 2022-12-04 DIAGNOSIS — K760 Fatty (change of) liver, not elsewhere classified: Secondary | ICD-10-CM | POA: Diagnosis not present

## 2022-12-04 DIAGNOSIS — K8020 Calculus of gallbladder without cholecystitis without obstruction: Secondary | ICD-10-CM | POA: Diagnosis not present

## 2022-12-04 DIAGNOSIS — R918 Other nonspecific abnormal finding of lung field: Secondary | ICD-10-CM | POA: Diagnosis not present

## 2022-12-04 MED ORDER — GADOBUTROL 1 MMOL/ML IV SOLN
9.0000 mL | Freq: Once | INTRAVENOUS | Status: AC | PRN
  Administered 2022-12-04: 9 mL via INTRAVENOUS

## 2022-12-05 ENCOUNTER — Encounter (INDEPENDENT_AMBULATORY_CARE_PROVIDER_SITE_OTHER): Payer: BC Managed Care – PPO | Admitting: Sports Medicine

## 2022-12-05 DIAGNOSIS — R911 Solitary pulmonary nodule: Secondary | ICD-10-CM

## 2022-12-05 DIAGNOSIS — M51369 Other intervertebral disc degeneration, lumbar region without mention of lumbar back pain or lower extremity pain: Secondary | ICD-10-CM

## 2022-12-05 DIAGNOSIS — M48061 Spinal stenosis, lumbar region without neurogenic claudication: Secondary | ICD-10-CM

## 2022-12-05 DIAGNOSIS — M5136 Other intervertebral disc degeneration, lumbar region: Secondary | ICD-10-CM

## 2022-12-11 NOTE — Assessment & Plan Note (Signed)
Pulmonary nodules essentially noted on abdominal MRI, recommendation for dedicated chest CT. Awaiting patient approval.

## 2022-12-12 DIAGNOSIS — R911 Solitary pulmonary nodule: Secondary | ICD-10-CM | POA: Insufficient documentation

## 2022-12-12 NOTE — Telephone Encounter (Signed)
I spent 5 total minutes of online digital evaluation and management services in this patient-initiated request for online care. 

## 2022-12-12 NOTE — Assessment & Plan Note (Signed)
Incidentally noted on abdominal MRI, not seen on previous chest CT, ordering updated chest CT for evaluation of pulmonary nodules.

## 2022-12-18 ENCOUNTER — Ambulatory Visit (INDEPENDENT_AMBULATORY_CARE_PROVIDER_SITE_OTHER): Payer: BC Managed Care – PPO

## 2022-12-18 DIAGNOSIS — R911 Solitary pulmonary nodule: Secondary | ICD-10-CM

## 2022-12-18 DIAGNOSIS — I700 Atherosclerosis of aorta: Secondary | ICD-10-CM | POA: Diagnosis not present

## 2022-12-18 DIAGNOSIS — R918 Other nonspecific abnormal finding of lung field: Secondary | ICD-10-CM | POA: Diagnosis not present

## 2022-12-27 DIAGNOSIS — D1801 Hemangioma of skin and subcutaneous tissue: Secondary | ICD-10-CM | POA: Diagnosis not present

## 2022-12-27 DIAGNOSIS — L814 Other melanin hyperpigmentation: Secondary | ICD-10-CM | POA: Diagnosis not present

## 2022-12-27 DIAGNOSIS — L821 Other seborrheic keratosis: Secondary | ICD-10-CM | POA: Diagnosis not present

## 2022-12-27 DIAGNOSIS — L570 Actinic keratosis: Secondary | ICD-10-CM | POA: Diagnosis not present

## 2023-01-08 NOTE — Telephone Encounter (Signed)

## 2023-01-09 NOTE — Addendum Note (Signed)
Addended by: Monica Becton on: 01/09/2023 05:42 PM   Modules accepted: Orders

## 2023-01-11 ENCOUNTER — Encounter: Payer: Self-pay | Admitting: Sports Medicine

## 2023-01-14 NOTE — Discharge Instructions (Signed)

## 2023-01-15 ENCOUNTER — Ambulatory Visit
Admission: RE | Admit: 2023-01-15 | Discharge: 2023-01-15 | Disposition: A | Discharge status: Home / Self Care | Payer: BC Managed Care – PPO | Source: Ambulatory Visit | Attending: Sports Medicine | Admitting: Sports Medicine

## 2023-01-15 DIAGNOSIS — M51369 Other intervertebral disc degeneration, lumbar region without mention of lumbar back pain or lower extremity pain: Secondary | ICD-10-CM

## 2023-01-15 DIAGNOSIS — M48061 Spinal stenosis, lumbar region without neurogenic claudication: Secondary | ICD-10-CM | POA: Diagnosis not present

## 2023-01-15 MED ORDER — IOPAMIDOL (ISOVUE-M 200) INJECTION 41%
1.0000 mL | Freq: Once | INTRAMUSCULAR | Status: AC
  Administered 2023-01-15: 1 mL via EPIDURAL

## 2023-01-15 MED ORDER — METHYLPREDNISOLONE ACETATE 40 MG/ML INJ SUSP (RADIOLOG
80.0000 mg | Freq: Once | INTRAMUSCULAR | Status: AC
  Administered 2023-01-15: 80 mg via EPIDURAL

## 2023-01-25 ENCOUNTER — Other Ambulatory Visit: Payer: Self-pay | Admitting: Sports Medicine

## 2023-01-25 DIAGNOSIS — E781 Pure hyperglyceridemia: Secondary | ICD-10-CM

## 2023-01-25 DIAGNOSIS — J4530 Mild persistent asthma, uncomplicated: Secondary | ICD-10-CM

## 2023-01-25 NOTE — Addendum Note (Signed)
Addended by: Monica Becton on: 01/25/2023 12:18 PM   Modules accepted: Orders

## 2023-01-25 NOTE — Telephone Encounter (Signed)

## 2023-01-26 ENCOUNTER — Other Ambulatory Visit: Payer: Self-pay | Admitting: Sports Medicine

## 2023-02-05 DIAGNOSIS — L570 Actinic keratosis: Secondary | ICD-10-CM | POA: Diagnosis not present

## 2023-02-05 DIAGNOSIS — L821 Other seborrheic keratosis: Secondary | ICD-10-CM | POA: Diagnosis not present

## 2023-02-12 DIAGNOSIS — H35363 Drusen (degenerative) of macula, bilateral: Secondary | ICD-10-CM | POA: Diagnosis not present

## 2023-02-12 DIAGNOSIS — H35712 Central serous chorioretinopathy, left eye: Secondary | ICD-10-CM | POA: Diagnosis not present

## 2023-02-12 DIAGNOSIS — H35723 Serous detachment of retinal pigment epithelium, bilateral: Secondary | ICD-10-CM | POA: Diagnosis not present

## 2023-02-12 DIAGNOSIS — H2513 Age-related nuclear cataract, bilateral: Secondary | ICD-10-CM | POA: Diagnosis not present

## 2023-02-19 ENCOUNTER — Ambulatory Visit: Payer: BC Managed Care – PPO | Admitting: Medical-Surgical

## 2023-02-21 ENCOUNTER — Ambulatory Visit (INDEPENDENT_AMBULATORY_CARE_PROVIDER_SITE_OTHER): Payer: BC Managed Care – PPO | Admitting: Family Medicine

## 2023-02-21 ENCOUNTER — Encounter: Payer: Self-pay | Admitting: Family Medicine

## 2023-02-21 VITALS — BP 137/83 | HR 77 | Ht 70.0 in | Wt 197.0 lb

## 2023-02-21 DIAGNOSIS — H8110 Benign paroxysmal vertigo, unspecified ear: Secondary | ICD-10-CM | POA: Diagnosis not present

## 2023-02-21 DIAGNOSIS — J329 Chronic sinusitis, unspecified: Secondary | ICD-10-CM | POA: Insufficient documentation

## 2023-02-21 MED ORDER — AMOXICILLIN 500 MG PO TABS
500.0000 mg | ORAL_TABLET | Freq: Two times a day (BID) | ORAL | 0 refills | Status: AC
Start: 2023-02-21 — End: 2023-02-26

## 2023-02-21 MED ORDER — DEXAMETHASONE 4 MG PO TABS: 4.0000 mg | ORAL_TABLET | Freq: Two times a day (BID) | ORAL | 0 refills | Status: AC

## 2023-02-21 MED ORDER — MECLIZINE HCL 25 MG PO TABS: 25.0000 mg | ORAL_TABLET | Freq: Three times a day (TID) | ORAL | 0 refills | Status: AC | PRN

## 2023-02-21 NOTE — Assessment & Plan Note (Signed)
Due to vertigo happening with head position changes this is likely BPPV. Recommend meclizine and PT  - since patient said girlfriend had the same thing we will treat for sinus infection and see if this is causing him symptoms - have gone ahead and given short steroid course and amoxicillin. Pt says his girlfriend was just diagnosed with strep and we discussed that amoxicillin would also cover for this as well

## 2023-02-21 NOTE — Progress Notes (Signed)
Acute Office Visit  Subjective:     Patient ID: Gabriel Wong, male    DOB: [birth year], 57 y.o.   MRN: [id number]  Chief Complaint  Patient presents with   Dizziness    X8 days    HPI Patient is in today for vertigo that has been ongoing for 8 days. Admits exacerbation of symptoms with standing and turning head.  Says meclizine does not help   Says his girlfriend had a similar case of this and was treated with abx and got better. Gabriel Wong does admit to sinus congestion.  Review of Systems  Constitutional:  Negative for chills and fever.  Respiratory:  Negative for cough and shortness of breath.   Cardiovascular:  Negative for chest pain.  Neurological:  Positive for dizziness. Negative for headaches.        Objective:    BP 137/83 (BP Location: Left Arm, Patient Position: Sitting, Cuff Size: Normal)   Pulse 77   Ht 5\' 10"  (1.778 m)   Wt 197 lb (89.4 kg)   SpO2 100%   BMI 28.27 kg/m    Physical Exam Vitals and nursing note reviewed.  Constitutional:      General: Gabriel Wong is not in acute distress.    Appearance: Normal appearance.  HENT:     Head: Normocephalic and atraumatic.     Right Ear: Tympanic membrane, ear canal and external ear normal.     Left Ear: Tympanic membrane, ear canal and external ear normal.     Nose: Congestion present.  Eyes:     Conjunctiva/sclera: Conjunctivae normal.  Cardiovascular:     Rate and Rhythm: Normal rate.  Pulmonary:     Effort: Pulmonary effort is normal.  Neurological:     General: No focal deficit present.     Mental Status: Gabriel Wong is alert and oriented to person, place, and time.  Psychiatric:        Mood and Affect: Mood normal.        Behavior: Behavior normal.        Thought Content: Thought content normal.        Judgment: Judgment normal.     No results found for any visits on 02/21/23.      Assessment & Plan:   Problem List Items Addressed This Visit       Respiratory   Sinusitis   Relevant Medications    amoxicillin (AMOXIL) 500 MG tablet   dexamethasone (DECADRON) 4 MG tablet     Nervous and Auditory   Benign paroxysmal positional vertigo - Primary    Due to vertigo happening with head position changes this is likely BPPV. Recommend meclizine and PT  - since patient said girlfriend had the same thing we will treat for sinus infection and see if this is causing him symptoms - have gone ahead and given short steroid course and amoxicillin. Pt says his girlfriend was just diagnosed with strep and we discussed that amoxicillin would also cover for this as well       Relevant Orders   Ambulatory referral to Physical Therapy    Meds ordered this encounter  Medications   amoxicillin (AMOXIL) 500 MG tablet    Sig: Take 1 tablet (500 mg total) by mouth 2 (two) times daily for 5 days.    Dispense:  10 tablet    Refill:  0   dexamethasone (DECADRON) 4 MG tablet    Sig: Take 1 tablet (4 mg total) by mouth 2 (two) times daily  with a meal for 5 days.    Dispense:  10 tablet    Refill:  0   meclizine (ANTIVERT) 25 MG tablet    Sig: Take 1 tablet (25 mg total) by mouth 3 (three) times daily as needed for dizziness.    Dispense:  30 tablet    Refill:  0    No follow-ups on file.  Charlton Amor, DO

## 2023-02-27 ENCOUNTER — Other Ambulatory Visit: Payer: Self-pay | Admitting: Sports Medicine

## 2023-03-05 NOTE — Telephone Encounter (Signed)

## 2023-03-05 NOTE — Addendum Note (Signed)
Addended by: Monica Becton on: 03/05/2023 04:54 PM   Modules accepted: Orders

## 2023-03-07 DIAGNOSIS — M9904 Segmental and somatic dysfunction of sacral region: Secondary | ICD-10-CM | POA: Diagnosis not present

## 2023-03-07 DIAGNOSIS — M5417 Radiculopathy, lumbosacral region: Secondary | ICD-10-CM | POA: Diagnosis not present

## 2023-03-07 DIAGNOSIS — M9903 Segmental and somatic dysfunction of lumbar region: Secondary | ICD-10-CM | POA: Diagnosis not present

## 2023-03-07 DIAGNOSIS — M9902 Segmental and somatic dysfunction of thoracic region: Secondary | ICD-10-CM | POA: Diagnosis not present

## 2023-03-07 NOTE — Addendum Note (Signed)
Addended by: Monica Becton on: 03/07/2023 04:04 PM   Modules accepted: Orders

## 2023-03-19 ENCOUNTER — Ambulatory Visit
Admission: RE | Admit: 2023-03-19 | Discharge: 2023-03-19 | Disposition: A | Discharge status: Home / Self Care | Payer: BC Managed Care – PPO | Source: Ambulatory Visit | Attending: Sports Medicine | Admitting: Sports Medicine

## 2023-03-19 DIAGNOSIS — M47816 Spondylosis without myelopathy or radiculopathy, lumbar region: Secondary | ICD-10-CM | POA: Diagnosis not present

## 2023-03-19 DIAGNOSIS — M48061 Spinal stenosis, lumbar region without neurogenic claudication: Secondary | ICD-10-CM

## 2023-03-19 MED ORDER — METHYLPREDNISOLONE ACETATE 40 MG/ML INJ SUSP (RADIOLOG
80.0000 mg | Freq: Once | INTRAMUSCULAR | Status: AC
  Administered 2023-03-19: 80 mg via EPIDURAL

## 2023-03-19 MED ORDER — IOPAMIDOL (ISOVUE-M 200) INJECTION 41%
1.0000 mL | Freq: Once | INTRAMUSCULAR | Status: AC
  Administered 2023-03-19: 1 mL via EPIDURAL

## 2023-03-19 NOTE — Discharge Instructions (Signed)

## 2023-03-20 ENCOUNTER — Inpatient Hospital Stay
Admission: RE | Admit: 2023-03-20 | Discharge: 2023-03-20 | Disposition: A | Discharge status: Home / Self Care | Payer: BC Managed Care – PPO | Source: Ambulatory Visit | Attending: Sports Medicine | Admitting: Sports Medicine

## 2023-03-21 DIAGNOSIS — M48062 Spinal stenosis, lumbar region with neurogenic claudication: Secondary | ICD-10-CM | POA: Diagnosis not present

## 2023-03-21 DIAGNOSIS — M545 Low back pain, unspecified: Secondary | ICD-10-CM | POA: Diagnosis not present

## 2023-03-21 DIAGNOSIS — Z6831 Body mass index (BMI) 31.0-31.9, adult: Secondary | ICD-10-CM | POA: Diagnosis not present

## 2023-03-22 DIAGNOSIS — M5416 Radiculopathy, lumbar region: Secondary | ICD-10-CM | POA: Diagnosis not present

## 2023-03-28 DIAGNOSIS — M5416 Radiculopathy, lumbar region: Secondary | ICD-10-CM | POA: Diagnosis not present

## 2023-03-28 DIAGNOSIS — M25552 Pain in left hip: Secondary | ICD-10-CM | POA: Diagnosis not present

## 2023-03-28 DIAGNOSIS — Z6831 Body mass index (BMI) 31.0-31.9, adult: Secondary | ICD-10-CM | POA: Diagnosis not present

## 2023-03-28 DIAGNOSIS — M48062 Spinal stenosis, lumbar region with neurogenic claudication: Secondary | ICD-10-CM | POA: Diagnosis not present

## 2023-03-29 ENCOUNTER — Other Ambulatory Visit: Payer: Self-pay | Admitting: Orthopaedic Surgery

## 2023-03-29 DIAGNOSIS — M5116 Intervertebral disc disorders with radiculopathy, lumbar region: Secondary | ICD-10-CM

## 2023-03-29 DIAGNOSIS — M48062 Spinal stenosis, lumbar region with neurogenic claudication: Secondary | ICD-10-CM

## 2023-03-29 DIAGNOSIS — M5416 Radiculopathy, lumbar region: Secondary | ICD-10-CM

## 2023-04-16 ENCOUNTER — Ambulatory Visit: Payer: BC Managed Care – PPO

## 2023-04-16 DIAGNOSIS — M48062 Spinal stenosis, lumbar region with neurogenic claudication: Secondary | ICD-10-CM | POA: Diagnosis not present

## 2023-04-16 DIAGNOSIS — M4726 Other spondylosis with radiculopathy, lumbar region: Secondary | ICD-10-CM | POA: Diagnosis not present

## 2023-04-16 DIAGNOSIS — M4856XA Collapsed vertebra, not elsewhere classified, lumbar region, initial encounter for fracture: Secondary | ICD-10-CM | POA: Diagnosis not present

## 2023-04-16 DIAGNOSIS — M5416 Radiculopathy, lumbar region: Secondary | ICD-10-CM

## 2023-04-16 DIAGNOSIS — M5116 Intervertebral disc disorders with radiculopathy, lumbar region: Secondary | ICD-10-CM | POA: Diagnosis not present

## 2023-04-23 DIAGNOSIS — G894 Chronic pain syndrome: Secondary | ICD-10-CM | POA: Diagnosis not present

## 2023-04-23 DIAGNOSIS — Z01818 Encounter for other preprocedural examination: Secondary | ICD-10-CM | POA: Diagnosis not present

## 2023-04-23 DIAGNOSIS — Z79899 Other long term (current) drug therapy: Secondary | ICD-10-CM | POA: Diagnosis not present

## 2023-04-23 DIAGNOSIS — Z79891 Long term (current) use of opiate analgesic: Secondary | ICD-10-CM | POA: Diagnosis not present

## 2023-04-23 DIAGNOSIS — Z01810 Encounter for preprocedural cardiovascular examination: Secondary | ICD-10-CM | POA: Diagnosis not present

## 2023-04-23 DIAGNOSIS — M48062 Spinal stenosis, lumbar region with neurogenic claudication: Secondary | ICD-10-CM | POA: Diagnosis not present

## 2023-04-26 ENCOUNTER — Telehealth: Payer: Self-pay | Admitting: Sports Medicine

## 2023-04-26 DIAGNOSIS — R9431 Abnormal electrocardiogram [ECG] [EKG]: Secondary | ICD-10-CM

## 2023-04-26 NOTE — Telephone Encounter (Signed)
 Okay now that I had a chance to get to this, I do not know what to refer him for unless I know what the problem is, what was the abnormality on the EKG?

## 2023-04-26 NOTE — Telephone Encounter (Signed)
 Good Morning, Patient called he is having surgery on Tuesday he had an EKG and he said they're not going to put him to sleep with going a Cardiologist before Tuesday he is requesting a referral ASAP

## 2023-04-29 ENCOUNTER — Telehealth: Payer: Self-pay | Admitting: Sports Medicine

## 2023-04-29 DIAGNOSIS — R9431 Abnormal electrocardiogram [ECG] [EKG]: Secondary | ICD-10-CM | POA: Insufficient documentation

## 2023-04-29 NOTE — Telephone Encounter (Signed)
 Received incoming fax request stating that patient needs cardiac clearance prior rescheduling surgery appointment. [facility] is requesting cardiology referral. Documentation has been under media management. Please advise.

## 2023-04-29 NOTE — Telephone Encounter (Signed)
 Copied from CRM [id number]. Topic: Referral - Status >> Apr 26, 2023  2:38 PM Powell HERO wrote: Reason for CRM: Patient called to let Gabriel Wong know he was just checking in for any updates. He is very anxious about his surgery not being completed on time and just looking for some updates. Please call patient if any updates become available to him. He states that he sees Dr. Royden Schneider in [place] for surgery and the direct number to access his office is [phone] if anyone needs to speak with that office. He is hoping to get a cardiology appointment set up in time for him to still have his surgery

## 2023-04-29 NOTE — Addendum Note (Signed)
 Addended by: Monica Becton on: 04/29/2023 11:28 AM   Modules accepted: Orders

## 2023-04-29 NOTE — Telephone Encounter (Signed)
 I already placed the referral to cardiology earlier today.

## 2023-04-29 NOTE — Assessment & Plan Note (Signed)
 Gabriel Wong is having surgery, he contacted me and told me that he needed a cardiology referral, ultimately an ECG report was dropped off, the report is normal sinus rhythm with possible LVH, age-indeterminate inferior infarct. I have not seen this patient and I do not know if he is having any new symptoms, I will go and place the referral to cardiology.

## 2023-04-29 NOTE — Telephone Encounter (Signed)
 Okay I have placed the order for cardiology consultation, and abnormal ECG by itself is not entirely concerning, I do not know his symptomatology since he was seen at an outside facility, but he can discuss this with his cardiologist.

## 2023-05-01 DIAGNOSIS — Z01818 Encounter for other preprocedural examination: Secondary | ICD-10-CM | POA: Diagnosis not present

## 2023-05-01 DIAGNOSIS — Z1330 Encounter for screening examination for mental health and behavioral disorders, unspecified: Secondary | ICD-10-CM | POA: Diagnosis not present

## 2023-05-01 DIAGNOSIS — R9431 Abnormal electrocardiogram [ECG] [EKG]: Secondary | ICD-10-CM | POA: Diagnosis not present

## 2023-05-01 DIAGNOSIS — I10 Essential (primary) hypertension: Secondary | ICD-10-CM | POA: Diagnosis not present

## 2023-05-01 DIAGNOSIS — R7989 Other specified abnormal findings of blood chemistry: Secondary | ICD-10-CM | POA: Diagnosis not present

## 2023-05-01 NOTE — Telephone Encounter (Signed)
 Patient has an appointment 05/01/2023 at 2:00 pm with Dr. Camila [facility].

## 2023-05-02 DIAGNOSIS — I10 Essential (primary) hypertension: Secondary | ICD-10-CM | POA: Diagnosis not present

## 2023-05-02 DIAGNOSIS — R9431 Abnormal electrocardiogram [ECG] [EKG]: Secondary | ICD-10-CM | POA: Diagnosis not present

## 2023-05-05 DIAGNOSIS — N390 Urinary tract infection, site not specified: Secondary | ICD-10-CM | POA: Diagnosis not present

## 2023-05-06 ENCOUNTER — Other Ambulatory Visit: Payer: Self-pay | Admitting: Sports Medicine

## 2023-05-06 DIAGNOSIS — J4530 Mild persistent asthma, uncomplicated: Secondary | ICD-10-CM

## 2023-05-06 DIAGNOSIS — E781 Pure hyperglyceridemia: Secondary | ICD-10-CM

## 2023-05-09 DIAGNOSIS — M5126 Other intervertebral disc displacement, lumbar region: Secondary | ICD-10-CM | POA: Diagnosis not present

## 2023-05-09 DIAGNOSIS — Z87891 Personal history of nicotine dependence: Secondary | ICD-10-CM | POA: Diagnosis not present

## 2023-05-09 DIAGNOSIS — J4489 Other specified chronic obstructive pulmonary disease: Secondary | ICD-10-CM | POA: Diagnosis not present

## 2023-05-09 DIAGNOSIS — Z79899 Other long term (current) drug therapy: Secondary | ICD-10-CM | POA: Diagnosis not present

## 2023-05-09 DIAGNOSIS — M48062 Spinal stenosis, lumbar region with neurogenic claudication: Secondary | ICD-10-CM | POA: Diagnosis not present

## 2023-05-09 DIAGNOSIS — I10 Essential (primary) hypertension: Secondary | ICD-10-CM | POA: Diagnosis not present

## 2023-05-09 DIAGNOSIS — M5116 Intervertebral disc disorders with radiculopathy, lumbar region: Secondary | ICD-10-CM | POA: Diagnosis not present

## 2023-05-09 DIAGNOSIS — G4730 Sleep apnea, unspecified: Secondary | ICD-10-CM | POA: Diagnosis not present

## 2023-05-09 DIAGNOSIS — Z87442 Personal history of urinary calculi: Secondary | ICD-10-CM | POA: Diagnosis not present

## 2023-05-10 DIAGNOSIS — Z87442 Personal history of urinary calculi: Secondary | ICD-10-CM | POA: Diagnosis not present

## 2023-05-10 DIAGNOSIS — M48062 Spinal stenosis, lumbar region with neurogenic claudication: Secondary | ICD-10-CM | POA: Diagnosis not present

## 2023-05-10 DIAGNOSIS — M5126 Other intervertebral disc displacement, lumbar region: Secondary | ICD-10-CM | POA: Diagnosis not present

## 2023-05-10 DIAGNOSIS — Z79899 Other long term (current) drug therapy: Secondary | ICD-10-CM | POA: Diagnosis not present

## 2023-05-10 DIAGNOSIS — Z87891 Personal history of nicotine dependence: Secondary | ICD-10-CM | POA: Diagnosis not present

## 2023-05-10 DIAGNOSIS — I10 Essential (primary) hypertension: Secondary | ICD-10-CM | POA: Diagnosis not present

## 2023-05-10 DIAGNOSIS — G4730 Sleep apnea, unspecified: Secondary | ICD-10-CM | POA: Diagnosis not present

## 2023-05-10 DIAGNOSIS — J4489 Other specified chronic obstructive pulmonary disease: Secondary | ICD-10-CM | POA: Diagnosis not present

## 2023-05-30 DIAGNOSIS — M48062 Spinal stenosis, lumbar region with neurogenic claudication: Secondary | ICD-10-CM | POA: Diagnosis not present

## 2023-06-18 ENCOUNTER — Other Ambulatory Visit: Payer: Self-pay | Admitting: Sports Medicine

## 2023-07-24 ENCOUNTER — Telehealth: Payer: Self-pay

## 2023-07-24 NOTE — Telephone Encounter (Signed)
 Copied from CRM [id number]. Topic: Clinical - Medication Question >> Jul 24, 2023 10:50 AM [name] wrote: Reason for CRM: Patient called and is requesting a Z pack be sent to [facility] [place], [place] - [address] He stated this is due to his seasonal allergies and it gets sent in twice a year without an appointment for him. He stated he is having SHORTNESS OF BREATH and trouble breathing due to his asthma and declined speaking to nurse triage.

## 2023-07-24 NOTE — Telephone Encounter (Signed)
 He always asks for this, not sending in antibiotics blindly.  He can see one of my partners since I'm not sure I have anything open soon.

## 2023-07-24 NOTE — Telephone Encounter (Signed)
 I know z-pac is not given for allergies Did you need patient to schedule a visit to discuss the SOB  with asthma ?

## 2023-07-25 DIAGNOSIS — M9902 Segmental and somatic dysfunction of thoracic region: Secondary | ICD-10-CM | POA: Diagnosis not present

## 2023-07-25 DIAGNOSIS — M9903 Segmental and somatic dysfunction of lumbar region: Secondary | ICD-10-CM | POA: Diagnosis not present

## 2023-07-25 DIAGNOSIS — M48062 Spinal stenosis, lumbar region with neurogenic claudication: Secondary | ICD-10-CM | POA: Diagnosis not present

## 2023-07-25 DIAGNOSIS — M9904 Segmental and somatic dysfunction of sacral region: Secondary | ICD-10-CM | POA: Diagnosis not present

## 2023-07-25 DIAGNOSIS — M5417 Radiculopathy, lumbosacral region: Secondary | ICD-10-CM | POA: Diagnosis not present

## 2023-07-29 ENCOUNTER — Telehealth: Admitting: Sports Medicine

## 2023-07-29 DIAGNOSIS — J45901 Unspecified asthma with (acute) exacerbation: Secondary | ICD-10-CM | POA: Diagnosis not present

## 2023-07-29 DIAGNOSIS — J4541 Moderate persistent asthma with (acute) exacerbation: Secondary | ICD-10-CM

## 2023-07-29 MED ORDER — AZITHROMYCIN 250 MG PO TABS: ORAL_TABLET | ORAL | 0 refills | Status: DC

## 2023-07-29 NOTE — Progress Notes (Signed)
   Virtual Visit via WebEx/MyChart   I connected with  Gabriel Wong  on 07/29/23 via WebEx/MyChart/Doximity Video and verified that I am speaking with the correct person using two identifiers.   I discussed the limitations, risks, security and privacy concerns of performing an evaluation and management service by WebEx/MyChart/Doximity Video, including the higher likelihood of inaccurate diagnosis and treatment, and the availability of in person appointments.  We also discussed the likely need of an additional face to face encounter for complete and high quality delivery of care.  I also discussed with the patient that there may be a patient responsible charge related to this service. The patient expressed understanding and wishes to proceed.  Provider location is in medical facility. Patient location is at their home, different from provider location. People involved in care of the patient during this telehealth encounter were myself, my nurse/medical assistant, and my front office/scheduling team member.  Review of Systems: No fevers, chills, night sweats, weight loss, chest pain, or shortness of breath.   Objective Findings:    General: Speaking full sentences, no audible heavy breathing.  Sounds alert and appropriately interactive.  Appears well.  Face symmetric.  Extraocular movements intact.  Pupils equal and round.  No nasal flaring or accessory muscle use visualized.  Independent interpretation of tests performed by another provider:   None.  Brief History, Exam, Impression, and Recommendations:    Acute asthma exacerbation This is a pleasant 58 year old male, he has a history of asthma, he also has the complicated history of community-acquired pneumonia, centrilobular emphysema. Once a year he will get an exacerbation of his pulmonary symptoms, he is having cough, shortness of breath, wheezing. Subjective fevers. We will add azithromycin, he declines prednisone, he will use  inhalers as needed, return as needed.   I discussed the above assessment and treatment plan with the patient. The patient was provided an opportunity to ask questions and all were answered. The patient agreed with the plan and demonstrated an understanding of the instructions.   The patient was advised to call back or seek an in-person evaluation if the symptoms worsen or if the condition fails to improve as anticipated.   I provided 30 minutes of face to face and non-face-to-face time during this encounter date, time was needed to gather information, review chart, records, communicate/coordinate with staff remotely, as well as complete documentation.   ____________________________________________ Joselyn Nicely. Sandy Crumb, M.D., ABFM., CAQSM., AME. Primary Care and Sports Medicine [facility]  Adjunct Professor of [facility] Medicine  [employer]  Restaurant manager, fast food

## 2023-07-29 NOTE — Assessment & Plan Note (Signed)
 This is a pleasant 58 year old male, he has a history of asthma, he also has the complicated history of community-acquired pneumonia, centrilobular emphysema. Once a year he will get an exacerbation of his pulmonary symptoms, he is having cough, shortness of breath, wheezing. Subjective fevers. We will add azithromycin, he declines prednisone, he will use inhalers as needed, return as needed.

## 2023-07-29 NOTE — Telephone Encounter (Signed)
Patient scheduled for today with Dr Benjamin Stain.

## 2023-08-06 ENCOUNTER — Other Ambulatory Visit: Payer: Self-pay | Admitting: Sports Medicine

## 2023-08-06 DIAGNOSIS — J4530 Mild persistent asthma, uncomplicated: Secondary | ICD-10-CM

## 2023-08-06 DIAGNOSIS — E781 Pure hyperglyceridemia: Secondary | ICD-10-CM

## 2023-08-07 ENCOUNTER — Other Ambulatory Visit: Payer: Self-pay | Admitting: Sports Medicine

## 2023-08-13 DIAGNOSIS — H35363 Drusen (degenerative) of macula, bilateral: Secondary | ICD-10-CM | POA: Diagnosis not present

## 2023-08-13 DIAGNOSIS — H35723 Serous detachment of retinal pigment epithelium, bilateral: Secondary | ICD-10-CM | POA: Diagnosis not present

## 2023-08-13 DIAGNOSIS — H35713 Central serous chorioretinopathy, bilateral: Secondary | ICD-10-CM | POA: Diagnosis not present

## 2023-09-03 DIAGNOSIS — M9904 Segmental and somatic dysfunction of sacral region: Secondary | ICD-10-CM | POA: Diagnosis not present

## 2023-09-03 DIAGNOSIS — M5417 Radiculopathy, lumbosacral region: Secondary | ICD-10-CM | POA: Diagnosis not present

## 2023-09-03 DIAGNOSIS — M9903 Segmental and somatic dysfunction of lumbar region: Secondary | ICD-10-CM | POA: Diagnosis not present

## 2023-09-18 ENCOUNTER — Telehealth: Payer: Self-pay | Admitting: Sports Medicine

## 2023-09-18 ENCOUNTER — Ambulatory Visit: Payer: Self-pay

## 2023-09-18 NOTE — Telephone Encounter (Signed)
 FYI Only or Action Required?: Action required by provider  Patient was last seen in primary care on 07/29/2023 by Gean Keels, MD. Called Nurse Triage reporting Breathing Problem. Symptoms began a week ago. Interventions attempted: OTC medications: Guaifenesin. Symptoms are: gradually worsening.  Triage Disposition: Go to ED Now (Notify PCP)  Patient/caregiver understands and will follow disposition?: Pt wishes to speak to provider, would like appt  Copied from CRM [id number]. Topic: Clinical - Red Word Triage >> Sep 18, 2023 11:29 AM Kevelyn M wrote: Red Word that prompted transfer to Nurse Triage: Trouble breathing because of allergies and wheezing. Patient stated he didn't get any sleep last night. Reason for Disposition  [1] MODERATE difficulty breathing (e.g., speaks in phrases, SOB even at rest, pulse 100-120) AND [2] NEW-onset or WORSE than normal  Answer Assessment - Initial Assessment Questions 1. RESPIRATORY STATUS: "Describe your breathing?" (e.g., wheezing, shortness of breath, unable to speak, severe coughing)      wheezing 2. ONSET: "When did this breathing problem begin?"      About 1.5 weeks ago, wheezing started last night 3. PATTERN "Does the difficult breathing come and go, or has it been constant since it started?"      Constant since yesterday 4. SEVERITY: "How bad is your breathing?" (e.g., mild, moderate, severe)    - MILD: No SOB at rest, mild SOB with walking, speaks normally in sentences, can lie down, no retractions, pulse < 100.    - MODERATE: SOB at rest, SOB with minimal exertion and prefers to sit, cannot lie down flat, speaks in phrases, mild retractions, audible wheezing, pulse 100-120.    - SEVERE: Very SOB at rest, speaks in single words, struggling to breathe, sitting hunched forward, retractions, pulse > 120      moderate 5. RECURRENT SYMPTOM: "Have you had difficulty breathing before?" If Yes, ask: "When was the last time?" and "What happened that  time?"      Yes last year at this time, d/t allergies/pollen 7. LUNG HISTORY: "Do you have any history of lung disease?"  (e.g., pulmonary embolus, asthma, emphysema)     asthma 8. CAUSE: "What do you think is causing the breathing problem?"      Unsure, hx of pneumonia  9. OTHER SYMPTOMS: "Do you have any other symptoms? (e.g., dizziness, runny nose, cough, chest pain, fever)     Dizziness, cough, mucus 10. O2 SATURATION MONITOR:  "Do you use an oxygen saturation monitor (pulse oximeter) at home?" If Yes, ask: "What is your reading (oxygen level) today?" "What is your usual oxygen saturation reading?" (e.g., 95%)       No equip 12. TRAVEL: "Have you traveled out of the country in the last month?" (e.g., travel history, exposures)       denies  Protocols used: Breathing Difficulty-A-AH

## 2023-09-18 NOTE — Telephone Encounter (Signed)
 Patient scheduled with DR. Courtland Ditch tomorrow at 10:10am - Of note the "nurse Burdette Carolin " mentioned in the message was not me .  In was in regards to the [facility] nurse Kim.

## 2023-09-18 NOTE — Telephone Encounter (Signed)
 Copied from CRM [id number]. Topic: Clinical - Red Word Triage >> Sep 18, 2023 12:11 PM Retta Caster wrote: Patient spoke to NT was instructed to go to Emergency denied and calling back now for app. I spoke with nurse Burdette Carolin she stated she send message for app for patient. Instructed to call [place]. Called but n/a. Will let patient know office will call him back after they get back from lunch [phone]

## 2023-09-19 ENCOUNTER — Ambulatory Visit (INDEPENDENT_AMBULATORY_CARE_PROVIDER_SITE_OTHER): Admitting: Family Medicine

## 2023-09-19 ENCOUNTER — Encounter: Payer: Self-pay | Admitting: Family Medicine

## 2023-09-19 ENCOUNTER — Ambulatory Visit

## 2023-09-19 VITALS — BP 128/81 | HR 74 | Temp 98.0°F | Resp 18 | Ht 70.0 in | Wt 193.5 lb

## 2023-09-19 DIAGNOSIS — R051 Acute cough: Secondary | ICD-10-CM

## 2023-09-19 DIAGNOSIS — R0602 Shortness of breath: Secondary | ICD-10-CM

## 2023-09-19 DIAGNOSIS — R059 Cough, unspecified: Secondary | ICD-10-CM

## 2023-09-19 MED ORDER — PREDNISONE 20 MG PO TABS
20.0000 mg | ORAL_TABLET | Freq: Every day | ORAL | 0 refills | Status: DC
Start: 2023-09-19 — End: 2024-01-07

## 2023-09-19 NOTE — Progress Notes (Signed)
 Acute Office Visit  Subjective:     Patient ID: Gabriel Wong, male    DOB: [birth year], 58 y.o.   MRN: [id number]  Chief Complaint  Patient presents with   Follow-up    Patient states that he did have some shortness of breath about two weeks ago and states that since then has been doing better.    HPI Patient is in today for acute visit.  Pt is new to me.  He has had some chest congestion, cough with yellow sputum, and SOB. He is taking Albuterol  inhaler prn, Advair  BID Singulair  10mg  at night and Flonase  daily. He says the SOB is better. He says now he has worsening chest and nasal congestion. He's been doing water  and OTC cough suppressant which is helping some. He is worried about a pneumonia as he's had this before. He reports he usually gets an antibiotic also for 'this'.  Pt with skin spots that he wants me to look at to make sure they aren't worrisome on his scalp.  He also asked about Skyrizi  and if this could be the cause of his previous pneumonia.   Review of Systems  Constitutional:  Negative for fever.  HENT:  Positive for congestion.   Respiratory:  Positive for cough.   All other systems reviewed and are negative.      Objective:    BP 128/81   Pulse 74   Temp 98 F (36.7 C) (Oral)   Resp 18   Ht 5\' 10"  (1.778 m)   Wt 193 lb 8 oz (87.8 kg)   SpO2 100%   BMI 27.76 kg/m  BP Readings from Last 3 Encounters:  09/19/23 128/81  03/19/23 (!) 152/94  02/21/23 137/83      Physical Exam Vitals and nursing note reviewed.  Constitutional:      Appearance: Normal appearance. He is normal weight.  HENT:     Head: Normocephalic and atraumatic.     Right Ear: Tympanic membrane, ear canal and external ear normal.     Left Ear: Tympanic membrane, ear canal and external ear normal.     Nose: Nose normal.     Mouth/Throat:     Mouth: Mucous membranes are moist.     Pharynx: Oropharynx is clear.  Eyes:     Conjunctiva/sclera: Conjunctivae normal.      Pupils: Pupils are equal, round, and reactive to light.  Cardiovascular:     Rate and Rhythm: Normal rate and regular rhythm.     Pulses: Normal pulses.     Heart sounds: Normal heart sounds.  Pulmonary:     Effort: Pulmonary effort is normal.     Breath sounds: Rhonchi present.  Skin:    General: Skin is warm.     Capillary Refill: Capillary refill takes less than 2 seconds.  Neurological:     General: No focal deficit present.     Mental Status: He is alert and oriented to person, place, and time. Mental status is at baseline.  Psychiatric:        Mood and Affect: Mood normal.        Behavior: Behavior normal.        Thought Content: Thought content normal.        Judgment: Judgment normal.   No results found for any visits on 09/19/23.      Assessment & Plan:   Problem List Items Addressed This Visit   None  Acute cough -     DG  Chest 2 View; Future -     predniSONE ; Take 1 tablet (20 mg total) by mouth daily with breakfast.  Dispense: 3 tablet; Refill: 0   Pt with chest congestion; hx of asthma. SOB improving. Pt worried about pneumonia and has rhonchi on exam. Send for xray. Start Prednisone  short burst x 3 days. F/u on CXR.  Advised pt that abx not indicated until xray returns. If it does show signs of infection, will be glad to send this in for him.  No orders of the defined types were placed in this encounter.   No follow-ups on file.  Manette Section, MD

## 2023-09-23 ENCOUNTER — Ambulatory Visit: Payer: Self-pay | Admitting: Family Medicine

## 2023-10-15 DIAGNOSIS — L821 Other seborrheic keratosis: Secondary | ICD-10-CM | POA: Diagnosis not present

## 2023-10-15 DIAGNOSIS — D1801 Hemangioma of skin and subcutaneous tissue: Secondary | ICD-10-CM | POA: Diagnosis not present

## 2023-10-15 DIAGNOSIS — L918 Other hypertrophic disorders of the skin: Secondary | ICD-10-CM | POA: Diagnosis not present

## 2023-10-15 DIAGNOSIS — L820 Inflamed seborrheic keratosis: Secondary | ICD-10-CM | POA: Diagnosis not present

## 2023-10-15 DIAGNOSIS — L814 Other melanin hyperpigmentation: Secondary | ICD-10-CM | POA: Diagnosis not present

## 2023-10-22 ENCOUNTER — Telehealth: Payer: Self-pay | Admitting: Sports Medicine

## 2023-10-22 DIAGNOSIS — R739 Hyperglycemia, unspecified: Secondary | ICD-10-CM

## 2023-10-22 DIAGNOSIS — Z111 Encounter for screening for respiratory tuberculosis: Secondary | ICD-10-CM

## 2023-10-22 DIAGNOSIS — N139 Obstructive and reflux uropathy, unspecified: Secondary | ICD-10-CM

## 2023-10-22 DIAGNOSIS — Z113 Encounter for screening for infections with a predominantly sexual mode of transmission: Secondary | ICD-10-CM

## 2023-10-22 DIAGNOSIS — L503 Dermatographic urticaria: Secondary | ICD-10-CM | POA: Diagnosis not present

## 2023-10-22 DIAGNOSIS — L400 Psoriasis vulgaris: Secondary | ICD-10-CM | POA: Diagnosis not present

## 2023-10-22 NOTE — Telephone Encounter (Signed)
 Pt called. He has a physical scheduled for 8/12 and wants the exact same labs he had drawn at last physical to include Quantiferon-TB gold Plus test.

## 2023-10-25 NOTE — Telephone Encounter (Signed)
 Done

## 2023-11-04 ENCOUNTER — Other Ambulatory Visit: Payer: Self-pay | Admitting: Sports Medicine

## 2023-11-04 DIAGNOSIS — J4530 Mild persistent asthma, uncomplicated: Secondary | ICD-10-CM

## 2023-11-04 DIAGNOSIS — E781 Pure hyperglyceridemia: Secondary | ICD-10-CM

## 2023-11-14 DIAGNOSIS — Z113 Encounter for screening for infections with a predominantly sexual mode of transmission: Secondary | ICD-10-CM | POA: Diagnosis not present

## 2023-11-14 DIAGNOSIS — N139 Obstructive and reflux uropathy, unspecified: Secondary | ICD-10-CM | POA: Diagnosis not present

## 2023-11-14 DIAGNOSIS — R739 Hyperglycemia, unspecified: Secondary | ICD-10-CM | POA: Diagnosis not present

## 2023-11-14 DIAGNOSIS — Z111 Encounter for screening for respiratory tuberculosis: Secondary | ICD-10-CM | POA: Diagnosis not present

## 2023-11-15 ENCOUNTER — Ambulatory Visit: Payer: Self-pay | Admitting: Sports Medicine

## 2023-11-18 LAB — COMPREHENSIVE METABOLIC PANEL WITH GFR
ALT: 121 IU/L — ABNORMAL HIGH (ref 0–44)
AST: 132 IU/L — ABNORMAL HIGH (ref 0–40)
Albumin: 4.8 g/dL (ref 3.8–4.9)
Alkaline Phosphatase: 61 IU/L (ref 44–121)
BUN/Creatinine Ratio: 30 — ABNORMAL HIGH (ref 9–20)
BUN: 29 mg/dL — ABNORMAL HIGH (ref 6–24)
Bilirubin Total: 1.1 mg/dL (ref 0.0–1.2)
CO2: 22 mmol/L (ref 20–29)
Calcium: 10.3 mg/dL — ABNORMAL HIGH (ref 8.7–10.2)
Chloride: 99 mmol/L (ref 96–106)
Creatinine, Ser: 0.97 mg/dL (ref 0.76–1.27)
Globulin, Total: 2.6 g/dL (ref 1.5–4.5)
Glucose: 104 mg/dL — ABNORMAL HIGH (ref 70–99)
Potassium: 3.7 mmol/L (ref 3.5–5.2)
Sodium: 141 mmol/L (ref 134–144)
Total Protein: 7.4 g/dL (ref 6.0–8.5)
eGFR: 90 mL/min/1.73 (ref >59)

## 2023-11-18 LAB — CBC
Hematocrit: 44 % (ref 37.5–51.0)
Hemoglobin: 15.5 g/dL (ref 13.0–17.7)
MCH: 35 pg — ABNORMAL HIGH (ref 26.6–33.0)
MCHC: 35.2 g/dL (ref 31.5–35.7)
MCV: 99 fL — ABNORMAL HIGH (ref 79–97)
Platelets: 212 x10E3/uL (ref 150–450)
RBC: 4.43 x10E6/uL (ref 4.14–5.80)
RDW: 12.1 % (ref 11.6–15.4)
WBC: 5.3 x10E3/uL (ref 3.4–10.8)

## 2023-11-18 LAB — ACUTE VIRAL HEPATITIS (HAV, HBV, HCV)
HCV Ab: NONREACTIVE
Hep A IgM: NEGATIVE
Hep B C IgM: NEGATIVE
Hepatitis B Surface Ag: NEGATIVE

## 2023-11-18 LAB — PSA, TOTAL AND FREE
PSA, Free Pct: 23.8 %
PSA, Free: 0.38 ng/mL
Prostate Specific Ag, Serum: 1.6 ng/mL (ref 0.0–4.0)

## 2023-11-18 LAB — LIPID PANEL
Chol/HDL Ratio: 5.5 ratio — ABNORMAL HIGH (ref 0.0–5.0)
Cholesterol, Total: 227 mg/dL — ABNORMAL HIGH (ref 100–199)
HDL: 41 mg/dL (ref >39)
LDL Chol Calc (NIH): 148 mg/dL — ABNORMAL HIGH (ref 0–99)
Triglycerides: 206 mg/dL — ABNORMAL HIGH (ref 0–149)
VLDL Cholesterol Cal: 38 mg/dL (ref 5–40)

## 2023-11-18 LAB — QUANTIFERON-TB GOLD PLUS
QuantiFERON Mitogen Value: >10 [IU]/mL
QuantiFERON Nil Value: 0.05 [IU]/mL
QuantiFERON TB1 Ag Value: 0.04 [IU]/mL
QuantiFERON TB2 Ag Value: 0.04 [IU]/mL

## 2023-11-18 LAB — RPR: RPR Ser Ql: NONREACTIVE

## 2023-11-18 LAB — HIV ANTIBODY (ROUTINE TESTING W REFLEX): HIV Screen 4th Generation wRfx: NONREACTIVE

## 2023-11-18 LAB — HCV INTERPRETATION

## 2023-11-18 LAB — TSH: TSH: 1.52 u[IU]/mL (ref 0.450–4.500)

## 2023-11-18 LAB — HEMOGLOBIN A1C
Est. average glucose Bld gHb Est-mCnc: 97 mg/dL
Hgb A1c MFr Bld: 5 % (ref 4.8–5.6)

## 2023-11-19 DIAGNOSIS — M9904 Segmental and somatic dysfunction of sacral region: Secondary | ICD-10-CM | POA: Diagnosis not present

## 2023-11-19 DIAGNOSIS — M9903 Segmental and somatic dysfunction of lumbar region: Secondary | ICD-10-CM | POA: Diagnosis not present

## 2023-11-19 DIAGNOSIS — M5417 Radiculopathy, lumbosacral region: Secondary | ICD-10-CM | POA: Diagnosis not present

## 2023-11-26 ENCOUNTER — Ambulatory Visit (INDEPENDENT_AMBULATORY_CARE_PROVIDER_SITE_OTHER): Admitting: Sports Medicine

## 2023-11-26 VITALS — BP 130/80 | HR 66 | Ht 70.0 in | Wt 198.0 lb

## 2023-11-26 DIAGNOSIS — Z0000 Encounter for general adult medical examination without abnormal findings: Secondary | ICD-10-CM | POA: Diagnosis not present

## 2023-11-26 DIAGNOSIS — E782 Mixed hyperlipidemia: Secondary | ICD-10-CM | POA: Diagnosis not present

## 2023-11-26 MED ORDER — REPATHA SURECLICK 140 MG/ML ~~LOC~~ SOAJ: 140.0000 mg | SUBCUTANEOUS | 11 refills | Status: DC

## 2023-11-26 MED ORDER — ICOSAPENT ETHYL 1 G PO CAPS: 2.0000 g | ORAL_CAPSULE | Freq: Two times a day (BID) | ORAL | 0 refills | Status: DC

## 2023-11-26 NOTE — Assessment & Plan Note (Signed)
 Lipids continue to be elevated, he continues with Vascepa , Zetia , fenofibrate , he is intolerant of statins, he does have some liver function elevation. Due to the above he is a candidate for Repatha . Recheck fasting lipids 2 to 3 months after starting medication.

## 2023-11-26 NOTE — Assessment & Plan Note (Signed)
 Annual physical as above. We discussed modifying risk factors. Return to see me in a year.

## 2023-11-26 NOTE — Progress Notes (Signed)
 Subjective:    CC: Annual Physical Exam  HPI:  This patient is here for their annual physical  I reviewed the past medical history, family history, social history, surgical history, and allergies today and no changes were needed.  Please see the problem list section below in epic for further details.  Past Medical History: Past Medical History:  Diagnosis Date   Alcoholic hepatitis    Anxiety    Asthma    Gout 04/28/2015   Hyperlipemia    Hypertension    Insomnia    Rosacea    Seasonal allergies    Past Surgical History: Past Surgical History:  Procedure Laterality Date   BACK SURGERY     CHEST TUBE INSERTION Right 10/13/2021   Procedure: INSERTION PLEURAL DRAINAGE CATHETER;  Surgeon: Brenna Adine CROME, DO;  Location: [facility];  Service: Pulmonary;  Laterality: Right;  63fr pigtail catheter   FISSURECTOMY     IR THORACENTESIS ASP PLEURAL SPACE W/IMG GUIDE  10/12/2021   Social History: Social History   Socioeconomic History   Marital status: Single    Spouse name: Not on file   Number of children: Not on file   Years of education: Not on file   Highest education level: Not on file  Occupational History   Not on file  Tobacco Use   Smoking status: Every Day    Current packs/day: 0.25    Average packs/day: 0.3 packs/day for 25.0 years (6.3 ttl pk-yrs)    Types: Cigarettes, Cigars   Smokeless tobacco: Never  Vaping Use   Vaping status: Never Used  Substance and Sexual Activity   Alcohol use: Yes   Drug use: Yes    Types: Marijuana   Sexual activity: Not on file  Other Topics Concern   Not on file  Social History Narrative   Not on file   Social Drivers of Health   Financial Resource Strain: Not on file  Food Insecurity: Low Risk  (05/09/2023)   Received from [facility]   Hunger Vital Sign    Within the past 12 months, you worried that your food would run out before you got money to buy more: Never true    Within the past 12 months, the food you  bought just didn't last and you didn't have money to get more. : Never true  Transportation Needs: No Transportation Needs (05/09/2023)   Received from [facility]    In the past 12 months, has lack of reliable transportation kept you from medical appointments, meetings, work or from getting things needed for daily living? : No  Physical Activity: Not on file  Stress: Not on file  Social Connections: Unknown (08/25/2021)   Received from [facility]   Social Network    Social Network: Not on file   Family History: No family history on file. Allergies: Allergies  Allergen Reactions   Lipitor [Atorvastatin] Other (See Comments)    Elevated LFTs   Tylenol  [Acetaminophen ] Other (See Comments)    Elevated LFTs   Medications: See med rec.  Review of Systems: No headache, visual changes, nausea, vomiting, diarrhea, constipation, dizziness, abdominal pain, skin rash, fevers, chills, night sweats, swollen lymph nodes, weight loss, chest pain, body aches, joint swelling, muscle aches, shortness of breath, mood changes, visual or auditory hallucinations.  Objective:    General: Well Developed, well nourished, and in no acute distress.  Neuro: Alert and oriented x3, extra-ocular muscles intact, sensation grossly intact. Cranial nerves II through XII  are intact, motor, sensory, and coordinative functions are all intact. HEENT: Normocephalic, atraumatic, pupils equal round reactive to light, neck supple, no masses, no lymphadenopathy, thyroid  nonpalpable. Oropharynx, nasopharynx, external ear canals are unremarkable. Skin: Warm and dry, no rashes noted.  Cardiac: Regular rate and rhythm, no murmurs rubs or gallops.  Respiratory: Clear to auscultation bilaterally. Not using accessory muscles, speaking in full sentences.  Abdominal: Soft, nontender, nondistended, positive bowel sounds, no masses, no organomegaly.  Musculoskeletal: Shoulder, elbow, wrist, hip, knee, ankle stable,  and with full range of motion.  Impression and Recommendations:    The patient was counselled, risk factors were discussed, anticipatory guidance given.  Annual physical exam Annual physical as above. We discussed modifying risk factors. Return to see me in a year.  Mixed hyperlipidemia Lipids continue to be elevated, he continues with Vascepa , Zetia , fenofibrate , he is intolerant of statins, he does have some liver function elevation. Due to the above he is a candidate for Repatha . Recheck fasting lipids 2 to 3 months after starting medication.   ____________________________________________ Debby PARAS. Curtis, M.D., ABFM., CAQSM., AME. Primary Care and Sports Medicine [facility]  Adjunct Professor of [facility] Medicine  [employer]  Restaurant manager, fast food

## 2023-11-27 ENCOUNTER — Telehealth: Payer: Self-pay

## 2023-11-27 ENCOUNTER — Other Ambulatory Visit (HOSPITAL_COMMUNITY): Payer: Self-pay

## 2023-11-27 DIAGNOSIS — E782 Mixed hyperlipidemia: Secondary | ICD-10-CM

## 2023-11-27 NOTE — Telephone Encounter (Signed)
 Pharmacy Patient Advocate Encounter  Received notification from [facility] that Prior Authorization for Vascepa  1gm caps has been APPROVED from 11/27/23 to 11/26/24   PA #/Case ID/Reference #: [id number]

## 2023-11-27 NOTE — Telephone Encounter (Signed)
 Pharmacy Patient Advocate Encounter   Received notification from Patient Pharmacy that prior authorization for Repatha  SureClick 140mg /ml is required/requested.   Insurance verification completed.   The patient is insured through [facility] .   Per test claim: PA required; PA started via CoverMyMeds. KEY [id number] . Waiting for clinical questions to populate.

## 2023-11-27 NOTE — Telephone Encounter (Signed)
 Pharmacy Patient Advocate Encounter   Received notification from Patient Pharmacy that prior authorization for Vascepa  1gm caps is required/requested.   Insurance verification completed.   The patient is insured through [facility] .   Per test claim: PA required; PA submitted to above mentioned insurance via Latent Key/confirmation #/EOC  [id number] Status is pending

## 2023-11-28 NOTE — Telephone Encounter (Signed)
 Pharmacy Patient Advocate Encounter  Received notification from [facility] that Prior Authorization for Repatha  SureClick 140mg /ml has been DENIED.  No reason given; No denial letter received via Fax or CMM. It has been requested and will be uploaded to the media tab once received.   PA #/Case ID/Reference #: [id number]

## 2023-11-28 NOTE — Telephone Encounter (Signed)
 Clinical questions answered and PA submitted.

## 2023-11-29 ENCOUNTER — Encounter: Admitting: Sports Medicine

## 2023-11-29 NOTE — Telephone Encounter (Signed)
 The PA for Repatha  was denied by the insurance. Patient has been notified of the insurance determination via a MyChart message.

## 2023-11-30 MED ORDER — PRALUENT 75 MG/ML ~~LOC~~ SOAJ: 75.0000 mg | SUBCUTANEOUS | 2 refills | Status: DC

## 2023-11-30 NOTE — Telephone Encounter (Signed)
 If no reason given, they may want praluent , switching to praluent .  If praluent  not covered will likely need to do a peer to peer.

## 2023-11-30 NOTE — Addendum Note (Signed)
 Addended by: CURTIS DEBBY PARAS on: 11/30/2023 10:23 AM   Modules accepted: Orders

## 2023-12-03 DIAGNOSIS — L821 Other seborrheic keratosis: Secondary | ICD-10-CM | POA: Diagnosis not present

## 2023-12-03 DIAGNOSIS — M6740 Ganglion, unspecified site: Secondary | ICD-10-CM | POA: Diagnosis not present

## 2023-12-03 DIAGNOSIS — L820 Inflamed seborrheic keratosis: Secondary | ICD-10-CM | POA: Diagnosis not present

## 2023-12-04 ENCOUNTER — Telehealth: Payer: Self-pay

## 2023-12-04 ENCOUNTER — Other Ambulatory Visit (HOSPITAL_COMMUNITY): Payer: Self-pay

## 2023-12-04 NOTE — Telephone Encounter (Signed)
 Pharmacy Patient Advocate Encounter   Received notification from Patient Pharmacy that prior authorization for Praluent  75mg /ml is required/requested.   Insurance verification completed.   The patient is insured through [facility] .   Per test claim: PA required; PA submitted to above mentioned insurance via Latent Key/confirmation #/EOC [id number] Status is pending

## 2023-12-05 NOTE — Telephone Encounter (Signed)
 Last read by Lemond ONEIDA Schooner at 4:16PM on 12/02/2023.

## 2023-12-06 NOTE — Telephone Encounter (Signed)
 Pharmacy Patient Advocate Encounter  Received notification from [facility] that Prior Authorization for Praluent  75mg /ml  has been DENIED.  Full denial letter will be uploaded to the media tab. See denial reason below.   PA #/Case ID/Reference #: [id number]    Repatha  denied on 11/28/2023. See denial reason below.    Both denial letters have been indexed to the media tab.

## 2023-12-10 ENCOUNTER — Other Ambulatory Visit (HOSPITAL_COMMUNITY): Payer: Self-pay

## 2023-12-11 NOTE — Telephone Encounter (Signed)
 Will have to refer to Cards since insurance has denied Repatha  and now Parluent.

## 2023-12-13 NOTE — Telephone Encounter (Signed)
 Cards referral

## 2023-12-17 ENCOUNTER — Encounter: Payer: Self-pay | Admitting: Sports Medicine

## 2023-12-18 ENCOUNTER — Ambulatory Visit
Admission: EM | Admit: 2023-12-18 | Discharge: 2023-12-18 | Disposition: A | Discharge status: Home / Self Care | Attending: Family Medicine | Admitting: Family Medicine

## 2023-12-18 ENCOUNTER — Telehealth: Payer: Self-pay | Admitting: Neurology

## 2023-12-18 ENCOUNTER — Telehealth: Payer: Self-pay

## 2023-12-18 DIAGNOSIS — M62830 Muscle spasm of back: Secondary | ICD-10-CM | POA: Diagnosis not present

## 2023-12-18 DIAGNOSIS — T148XXA Other injury of unspecified body region, initial encounter: Secondary | ICD-10-CM

## 2023-12-18 DIAGNOSIS — R109 Unspecified abdominal pain: Secondary | ICD-10-CM | POA: Diagnosis not present

## 2023-12-18 LAB — POCT URINALYSIS DIP (MANUAL ENTRY)
Bilirubin, UA: NEGATIVE
Blood, UA: NEGATIVE
Glucose, UA: NEGATIVE mg/dL
Leukocytes, UA: NEGATIVE
Nitrite, UA: NEGATIVE
Spec Grav, UA: 1.02 (ref 1.010–1.025)
Urobilinogen, UA: 0.2 U/dL
pH, UA: 7 (ref 5.0–8.0)

## 2023-12-18 NOTE — Telephone Encounter (Signed)
 Copied from CRM #[id number]. Topic: Clinical - Medication Question >> Dec 18, 2023  8:41 AM Suzen RAMAN wrote: Reason for CRM: Patient was a previous patient of T. Thekkekandam and has now awaiting to establish care with Waddell Beck(02/04/24) and would like assistance prior to his upcoming appt with starting medication Repatha . Patient has already been made aware that care would need to be establish before medication would be prescribed but requested a message be sent for provider to review chart and provide assistance.

## 2023-12-18 NOTE — ED Triage Notes (Signed)
 Pt c/o mid back pain x 10 days. Prior to pain, went kayaking and golfing. Ibuprofen , heat/ice prn. Hx of back laminectomy in lumbar spine in Jan.
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
## 2023-12-18 NOTE — Telephone Encounter (Signed)
 I'll probably have to write a note talking about previous statin use or intolerance, so I would need to see him first. You can see if he is able to move his appointment up sooner.

## 2023-12-18 NOTE — Telephone Encounter (Signed)
 Message received at wrong office Forwarding message to Waddell Mon for review.

## 2023-12-18 NOTE — Telephone Encounter (Signed)
 Copied from CRM #[id number]. Topic: Appointments - Transfer of Care >> Dec 18, 2023  8:42 AM Suzen RAMAN wrote: Pt is requesting to transfer FROM: Gabriel Wong Pt is requesting to transfer TO: Gabriel Wong Reason for requested transfer: Original Provider Left It is the responsibility of the team the patient would like to transfer to Gabriel Wong) to reach out to the patient if for any reason this transfer is not acceptable.

## 2023-12-18 NOTE — Discharge Instructions (Signed)
 Use ice or heat to painful back muscles Limit activity while back hurts Take your muscle relaxer at bedtime for the next few nights May increase ibuprofen Return if not improving by next week

## 2023-12-19 NOTE — Telephone Encounter (Signed)
 Spoke with patient, he is aware would need notes before we could help to get coverage for medications. He is not sure if he will keep appt here, he wants a male provider. He does need refills of some medications. Let him know I would forward this message to [facility] so they can address how they are handling Dr. Nathalie refill requests. He expressed appreciation.

## 2023-12-20 ENCOUNTER — Telehealth: Payer: Self-pay | Admitting: Sports Medicine

## 2023-12-20 NOTE — Telephone Encounter (Signed)
 Copied from CRM [id number]. Topic: Appointments - Scheduling Inquiry for Clinic >> Dec 20, 2023  8:32 AM Laurier C wrote: Reason for CRM: Patient is looking to become a new patient of Dr. Alvia and would like a call back if the provider is willing accept him. Patients call back # is [phone]

## 2023-12-23 ENCOUNTER — Encounter: Payer: Self-pay | Admitting: Family Medicine

## 2023-12-23 NOTE — Telephone Encounter (Signed)
 Forwarding message to Dr. Alvia.  Note from patient states Dr. Alvia has agreed to take him on as a new patient.  ( I am sorry for the confusion Joy)

## 2023-12-26 ENCOUNTER — Telehealth: Payer: Self-pay

## 2023-12-26 ENCOUNTER — Ambulatory Visit
Admission: RE | Admit: 2023-12-26 | Discharge: 2023-12-26 | Disposition: A | Discharge status: Home / Self Care | Attending: Family Medicine | Admitting: Family Medicine

## 2023-12-26 VITALS — BP 148/101 | HR 61 | Temp 98.2°F | Resp 18

## 2023-12-26 DIAGNOSIS — N3000 Acute cystitis without hematuria: Secondary | ICD-10-CM | POA: Diagnosis not present

## 2023-12-26 LAB — POCT URINALYSIS DIP (MANUAL ENTRY)
Bilirubin, UA: NEGATIVE
Blood, UA: NEGATIVE
Glucose, UA: 100 mg/dL — AB
Ketones, POC UA: NEGATIVE mg/dL
Leukocytes, UA: NEGATIVE
Nitrite, UA: POSITIVE — AB
Protein Ur, POC: 30 mg/dL — AB
Spec Grav, UA: 1.02 (ref 1.010–1.025)
Urobilinogen, UA: 1 U/dL
pH, UA: 7 (ref 5.0–8.0)

## 2023-12-26 MED ORDER — CEFDINIR 300 MG PO CAPS
300.0000 mg | ORAL_CAPSULE | Freq: Two times a day (BID) | ORAL | 0 refills | Status: DC
Start: 2023-12-26 — End: 2024-01-02

## 2023-12-26 NOTE — Telephone Encounter (Signed)
 Pt sts he is now having urinary frequency, as well as flank pain. Sts he wants to come in and have his urine checked again.

## 2023-12-26 NOTE — Discharge Instructions (Addendum)
 Advised patient to take medication as directed with food to completion.  Encouraged to increase daily water  intake to 64 ounces per day while taking this medication.  Advised increase in daily water  intake to 64 ounces per day while taking this medication.  Advised if symptoms worsen and/or unresolved please follow-up with your PCP or here for further evaluation.  Patient if symptoms worsen and/or unresolved please follow-up with your PCP, [facility], or here for further evaluation.

## 2023-12-26 NOTE — Telephone Encounter (Signed)
 Patient informed and will address  this at upcoming appt .
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
## 2023-12-26 NOTE — Telephone Encounter (Unsigned)
 Patient came into office concerning refills on his medications

## 2023-12-26 NOTE — ED Triage Notes (Signed)
 Patient c/o urinary urgency yesterday morning, patient has taken AZO and right sided flank pain.  Denies dysuria and afebrile.

## 2023-12-27 ENCOUNTER — Telehealth: Payer: Self-pay

## 2023-12-27 ENCOUNTER — Ambulatory Visit: Payer: Self-pay

## 2023-12-27 LAB — URINE CULTURE: Culture: NO GROWTH

## 2023-12-27 NOTE — Telephone Encounter (Signed)
 Pt has myriad of questions, asked Dr. Pauline to speak to patient on phone.

## 2023-12-27 NOTE — Telephone Encounter (Signed)
 Pt declined assistance finding same day appointment with a primary care provider.

## 2023-12-27 NOTE — Telephone Encounter (Signed)
 FYI Only or Action Required?: FYI only for provider.  Patient was last seen in primary care on 11/26/2023 by Curtis Debby PARAS, MD.  Called Nurse Triage reporting Advice Only.  Symptoms began yesterday.  Interventions attempted: Other: n/a.  Symptoms are: n/a.  Triage Disposition: Information or Advice Only Call  Patient/caregiver understands and will follow disposition?: Yes  Copied from CRM [id number]. Topic: Clinical - Red Word Triage >> Dec 27, 2023  3:43 PM Mercer PEDLAR wrote: Red Word that prompted transfer to Nurse Triage: a lot of pain due to UTI, has been to urgent care, antibiotics causing abdominal pain. Reason for Disposition  Health information question, no triage required and triager able to answer question  Answer Assessment - Initial Assessment Questions Patient called to confirm that it is reasonable to take antibiotics for UTI. Concerned that his first day urine culture was negative.  Patient checking results via mychart  Explained that urine cultures are read daily for three days and not finalized until read on the third day.   Patient verbalizes understanding and will continue with prescribed antibiotic  Protocols used: Information Only Call - No Triage-A-AH

## 2023-12-27 NOTE — Telephone Encounter (Signed)
 Pt requesting information regarding urine culture. Advised patient there was no growth on the urine culture. Pt still having pain. Advised patient to follow up with pcp as instructed on his AVS. Pt sts he does not have one at this time but has an appointment with one in 2 weeks. Advised patient to call that office and see if that provider can push up appointment. Pt states okay.

## 2023-12-28 MED ORDER — SULFAMETHOXAZOLE-TRIMETHOPRIM 800-160 MG PO TABS: 1.0000 | ORAL_TABLET | Freq: Two times a day (BID) | ORAL | 0 refills | Status: AC

## 2023-12-28 NOTE — Telephone Encounter (Signed)
 Phone discussion with patient 12/27/23: Patient is concerned about possible kidney etiology for his intermittent lower back and flank pain that occurs with movement.  He denies radicular pain in his lower extremities and admits that recently he has been golfing and kayaking.  He was treated for a UTI 12/26/23 because of mild dysuria for two days but is now confused about his negative urine culture results.  His urinary symptoms have improved after starting Omnicef  and he wonders if he still needs it.  He admits that he normally has occasional mild nocturia but minimal difficulty starting his urinary stream. Review of records reveals history of L2, 3, 4, and 5 laminectomy this year.  Suspect that his back and flank pain are musculoskeletal in nature resulting from his physical activity rather than radiculopathy.  CMP reveals normal kidney function. Despite his negative urine culture, his dysuria may represent mild prostatitis (culture bacterial growth may have been beneath the lower limits of lab positivity).  Recommend that he stop the Omnicef  for now.  If urinary symptoms recur after several days, begin Bactrim  DS BID (which has much better prostate penetration than cefdinir ).  Rx written for Bactrim  DS #20 to hold.  If symptoms persist, recommend follow-up with his PCP.  Fortunately his PSA and free PSA were normal on 11/14/23.

## 2024-01-06 ENCOUNTER — Ambulatory Visit (INDEPENDENT_AMBULATORY_CARE_PROVIDER_SITE_OTHER): Admitting: Family Medicine

## 2024-01-06 ENCOUNTER — Ambulatory Visit (INDEPENDENT_AMBULATORY_CARE_PROVIDER_SITE_OTHER)

## 2024-01-06 ENCOUNTER — Ambulatory Visit: Payer: Self-pay | Admitting: Family Medicine

## 2024-01-06 VITALS — BP 163/80 | HR 69 | Ht 70.0 in | Wt 202.0 lb

## 2024-01-06 DIAGNOSIS — E781 Pure hyperglyceridemia: Secondary | ICD-10-CM | POA: Diagnosis not present

## 2024-01-06 DIAGNOSIS — K7010 Alcoholic hepatitis without ascites: Secondary | ICD-10-CM

## 2024-01-06 DIAGNOSIS — E782 Mixed hyperlipidemia: Secondary | ICD-10-CM | POA: Diagnosis not present

## 2024-01-06 DIAGNOSIS — R011 Cardiac murmur, unspecified: Secondary | ICD-10-CM | POA: Insufficient documentation

## 2024-01-06 DIAGNOSIS — M545 Low back pain, unspecified: Secondary | ICD-10-CM

## 2024-01-06 DIAGNOSIS — J4530 Mild persistent asthma, uncomplicated: Secondary | ICD-10-CM | POA: Diagnosis not present

## 2024-01-06 DIAGNOSIS — M47816 Spondylosis without myelopathy or radiculopathy, lumbar region: Secondary | ICD-10-CM | POA: Diagnosis not present

## 2024-01-06 DIAGNOSIS — R109 Unspecified abdominal pain: Secondary | ICD-10-CM | POA: Insufficient documentation

## 2024-01-06 DIAGNOSIS — F411 Generalized anxiety disorder: Secondary | ICD-10-CM

## 2024-01-06 DIAGNOSIS — R051 Acute cough: Secondary | ICD-10-CM

## 2024-01-06 DIAGNOSIS — I10 Essential (primary) hypertension: Secondary | ICD-10-CM

## 2024-01-06 LAB — POCT URINALYSIS DIP (CLINITEK)
Bilirubin, UA: NEGATIVE
Blood, UA: NEGATIVE
Glucose, UA: NEGATIVE mg/dL
Ketones, POC UA: NEGATIVE mg/dL
Leukocytes, UA: NEGATIVE
Nitrite, UA: NEGATIVE
POC PROTEIN,UA: NEGATIVE
Spec Grav, UA: 1.015 (ref 1.010–1.025)
Urobilinogen, UA: 0.2 U/dL
pH, UA: 7 (ref 5.0–8.0)

## 2024-01-06 MED ORDER — AMLODIPINE BESYLATE 5 MG PO TABS: 5.0000 mg | ORAL_TABLET | Freq: Every day | ORAL | 3 refills | Status: AC

## 2024-01-06 MED ORDER — FLUTICASONE-SALMETEROL 250-50 MCG/ACT IN AEPB: 1.0000 | INHALATION_SPRAY | Freq: Two times a day (BID) | RESPIRATORY_TRACT | 1 refills | Status: DC

## 2024-01-06 MED ORDER — REPATHA SURECLICK 140 MG/ML ~~LOC~~ SOAJ: 140.0000 mg | SUBCUTANEOUS | 1 refills | Status: DC

## 2024-01-06 MED ORDER — EZETIMIBE 10 MG PO TABS: 10.0000 mg | ORAL_TABLET | Freq: Every day | ORAL | 1 refills | Status: DC

## 2024-01-06 MED ORDER — IRBESARTAN 300 MG PO TABS: 300.0000 mg | ORAL_TABLET | Freq: Every day | ORAL | 1 refills | Status: DC

## 2024-01-06 MED ORDER — ALBUTEROL SULFATE HFA 108 (90 BASE) MCG/ACT IN AERS: INHALATION_SPRAY | RESPIRATORY_TRACT | 3 refills | Status: AC

## 2024-01-06 MED ORDER — MONTELUKAST SODIUM 10 MG PO TABS: 10.0000 mg | ORAL_TABLET | Freq: Every day | ORAL | 1 refills | Status: DC

## 2024-01-06 MED ORDER — EPLERENONE 25 MG PO TABS: 25.0000 mg | ORAL_TABLET | Freq: Every day | ORAL | 1 refills | Status: AC

## 2024-01-06 MED ORDER — ICOSAPENT ETHYL 1 G PO CAPS: 2.0000 g | ORAL_CAPSULE | Freq: Two times a day (BID) | ORAL | 1 refills | Status: DC

## 2024-01-06 MED ORDER — LANSOPRAZOLE 30 MG PO CPDR: 30.0000 mg | DELAYED_RELEASE_CAPSULE | Freq: Two times a day (BID) | ORAL | 1 refills | Status: DC

## 2024-01-06 MED ORDER — ALPRAZOLAM 0.25 MG PO TABS: 0.2500 mg | ORAL_TABLET | Freq: Two times a day (BID) | ORAL | 1 refills | Status: DC | PRN

## 2024-01-06 MED ORDER — TRIAMCINOLONE ACETONIDE 0.1 % EX CREA: TOPICAL_CREAM | CUTANEOUS | 1 refills | Status: DC

## 2024-01-06 MED ORDER — FENOFIBRATE 160 MG PO TABS: 160.0000 mg | ORAL_TABLET | Freq: Every day | ORAL | 1 refills | Status: DC

## 2024-01-06 MED ORDER — ALLOPURINOL 300 MG PO TABS: 300.0000 mg | ORAL_TABLET | Freq: Every day | ORAL | 1 refills | Status: DC

## 2024-01-06 NOTE — Assessment & Plan Note (Signed)
 He does have alprazolam  as needed.  Discussed trying to limit this is much as possible.

## 2024-01-06 NOTE — Assessment & Plan Note (Signed)
 Cholesterol has been elevated.  He would benefit from Repatha  due to relative contraindication from his transaminitis.

## 2024-01-06 NOTE — Assessment & Plan Note (Signed)
 Urinalysis is normal today.  Will go ahead and send for culture given concern about prostatitis.  Will check PSA.  Checking KUB as well as repeat x-rays of lumbar spine.

## 2024-01-06 NOTE — Assessment & Plan Note (Addendum)
 Blood pressure is elevated today.  Recommend low-sodium diet and continue irbesartan  regularly.  Return in a few weeks for blood pressure recheck.

## 2024-01-06 NOTE — Assessment & Plan Note (Signed)
 He does continue to drink quite a bit throughout the week.  Encouraged to cut back on this.

## 2024-01-06 NOTE — Progress Notes (Unsigned)
 Gabriel Wong - 58 y.o. male MRN [id number]  Date of birth: 10-21-1965  Subjective Chief Complaint  Patient presents with   Transitions Of Care    HPI Gabriel Wong is a 57 y.o. male here today for visit to transfer care.  He is a former patient of Dr. Curtis.  History of hyperlipidemia.  He does have elevated LFTs so the decision was made to avoid statin due to risk of this further increasing his LFTs.  Was not initially approved for Repatha .  Praluent  sent and it was not approved for this due to needing to try Repatha  first.  He is taking Vascepa  as well as Zetia .  He does consume a decent amount of alcohol each day.  He does have history of psoriasis.  This is being managed by dermatology.  Continues on Skyrizi  and triamcinolone .  Has had some flank pain.  Most on the right side and radiates into the front of the abdomen.  Seen in urgent care because he was having some urinary frequency as well.  Treated as UTI initially.  Urine culture without growth.  He was contacted and recommended to change to Bactrim  for coverage of prostatitis.  He never started this.  The pain is worse with certain movements he has tried muscle relaxers as well.  ROS:  A comprehensive ROS was completed and negative except as noted per HPI  Allergies  Allergen Reactions   Lipitor [Atorvastatin] Other (See Comments)    Elevated LFTs   Tylenol  [Acetaminophen ] Other (See Comments)    Elevated LFTs    Past Medical History:  Diagnosis Date   Alcoholic hepatitis    Anxiety    Asthma    Gout 04/28/2015   Hyperlipemia    Hypertension    Insomnia    Rosacea    Seasonal allergies     Past Surgical History:  Procedure Laterality Date   BACK SURGERY     CHEST TUBE INSERTION Right 10/13/2021   Procedure: INSERTION PLEURAL DRAINAGE CATHETER;  Surgeon: Brenna Adine CROME, DO;  Location: [facility];  Service: Pulmonary;  Laterality: Right;  63fr pigtail catheter   FISSURECTOMY     IR THORACENTESIS  ASP PLEURAL SPACE W/IMG GUIDE  10/12/2021    Social History   Socioeconomic History   Marital status: Single    Spouse name: Not on file   Number of children: Not on file   Years of education: Not on file   Highest education level: Bachelor's degree (e.g., BA, AB, BS)  Occupational History   Not on file  Tobacco Use   Smoking status: Every Day    Current packs/day: 0.25    Average packs/day: 0.3 packs/day for 25.0 years (6.3 ttl pk-yrs)    Types: Cigarettes, Cigars   Smokeless tobacco: Never  Vaping Use   Vaping status: Never Used  Substance and Sexual Activity   Alcohol use: Yes   Drug use: Yes    Types: Marijuana   Sexual activity: Yes  Other Topics Concern   Not on file  Social History Narrative   Not on file   Social Drivers of Health   Financial Resource Strain: Low Risk  (01/06/2024)   Overall Financial Resource Strain (CARDIA)    Difficulty of Paying Living Expenses: Not hard at all  Food Insecurity: No Food Insecurity (01/06/2024)   Hunger Vital Sign    Worried About Running Out of Food in the Last Year: Never true    Ran Out of Food in the  Last Year: Never true  Transportation Needs: No Transportation Needs (01/06/2024)   PRAPARE - Administrator, Civil Service (Medical): No    Lack of Transportation (Non-Medical): No  Physical Activity: Sufficiently Active (01/06/2024)   Exercise Vital Sign    Days of Exercise per Week: 5 days    Minutes of Exercise per Session: 30 min  Stress: Stress Concern Present (01/06/2024)   [employer] of Occupational Health - Occupational Stress Questionnaire    Feeling of Stress: To some extent  Social Connections: Moderately Integrated (01/06/2024)   Social Connection and Isolation Panel    Frequency of Communication with Friends and Family: More than three times a week    Frequency of Social Gatherings with Friends and Family: More than three times a week    Attends Religious Services: More than 4 times per year     Active Member of [employer] or Organizations: Yes    Attends Engineer, structural: More than 4 times per year    Marital Status: Divorced    History reviewed. No pertinent family history.  Health Maintenance  Topic Date Due   Influenza Vaccine  07/14/2024 (Originally 11/15/2023)   Fecal DNA (Cologuard)  11/16/2024   DTaP/Tdap/Td (2 - Td or Tdap) 07/30/2027   Pneumococcal Vaccine: 50+ Years  Completed   Hepatitis B Vaccines 19-59 Average Risk  Completed   Hepatitis C Screening  Completed   HIV Screening  Completed   Zoster Vaccines- Shingrix  Completed   HPV VACCINES  Aged Out   Meningococcal B Vaccine  Aged Out   COVID-19 Vaccine  Discontinued     ----------------------------------------------------------------------------------------------------------------------------------------------------------------------------------------------------------------- Physical Exam BP (!) 163/80 (BP Location: Left Arm, Patient Position: Sitting, Cuff Size: Normal)   Pulse 69   Ht 5' 10 (1.778 m)   Wt 202 lb (91.6 kg)   SpO2 97%   BMI 28.98 kg/m   Physical Exam Constitutional:      Appearance: Normal appearance.  Eyes:     General: No scleral icterus. Cardiovascular:     Rate and Rhythm: Normal rate and regular rhythm.  Pulmonary:     Effort: Pulmonary effort is normal.     Breath sounds: Normal breath sounds.  Abdominal:     Tenderness: There is no right CVA tenderness or left CVA tenderness.  Musculoskeletal:     Cervical back: Neck supple.  Neurological:     Mental Status: He is alert.  Psychiatric:        Mood and Affect: Mood normal.        Behavior: Behavior normal.     ------------------------------------------------------------------------------------------------------------------------------------------------------------------------------------------------------------------- Assessment and Plan  Essential hypertension Blood pressure is elevated today.   Recommend low-sodium diet and continue irbesartan  regularly.  Return in a few weeks for blood pressure recheck.  Generalized anxiety disorder He does have alprazolam  as needed.  Discussed trying to limit this is much as possible.  Alcoholic hepatitis without ascites He does continue to drink quite a bit throughout the week.  Encouraged to cut back on this.  Mixed hyperlipidemia Cholesterol has been elevated.  He would benefit from Repatha  due to relative contraindication from his transaminitis.  Flank pain Urinalysis is normal today.  Will go ahead and send for culture given concern about prostatitis.  Will check PSA.  Checking KUB as well as repeat x-rays of lumbar spine.   Meds ordered this encounter  Medications   albuterol  (VENTOLIN  HFA) 108 (90 Base) MCG/ACT inhaler    Sig: inhale TWO PUFFS into  THE lungs every 4-6 hours AS NEEDED FOR shortness OF breath OR FOR WHEEZING    Dispense:  54 g    Refill:  3    This prescription was filled on 11/04/2023. Any refills authorized will be placed on file.   allopurinol  (ZYLOPRIM ) 300 MG tablet    Sig: Take 1 tablet (300 mg total) by mouth daily.    Dispense:  90 tablet    Refill:  1   ALPRAZolam  (XANAX ) 0.25 MG tablet    Sig: Take 1 tablet (0.25 mg total) by mouth 2 (two) times daily as needed for anxiety.    Dispense:  180 tablet    Refill:  1   amLODipine  (NORVASC ) 5 MG tablet    Sig: Take 1 tablet (5 mg total) by mouth daily.    Dispense:  90 tablet    Refill:  3    This prescription was filled on 01/25/2023. Any refills authorized will be placed on file.   eplerenone  (INSPRA ) 25 MG tablet    Sig: Take 1 tablet (25 mg total) by mouth daily.    Dispense:  90 tablet    Refill:  1   ezetimibe  (ZETIA ) 10 MG tablet    Sig: Take 1 tablet (10 mg total) by mouth daily.    Dispense:  90 tablet    Refill:  1   fenofibrate  160 MG tablet    Sig: Take 1 tablet (160 mg total) by mouth daily.    Dispense:  90 tablet    Refill:  1    fluticasone -salmeterol (ADVAIR ) 250-50 MCG/ACT AEPB    Sig: Inhale 1 puff into the lungs in the morning and at bedtime.    Dispense:  180 each    Refill:  1   icosapent  Ethyl (VASCEPA ) 1 g capsule    Sig: Take 2 capsules (2 g total) by mouth 2 (two) times daily.    Dispense:  360 capsule    Refill:  1   irbesartan  (AVAPRO ) 300 MG tablet    Sig: Take 1 tablet (300 mg total) by mouth daily.    Dispense:  90 tablet    Refill:  1   lansoprazole  (PREVACID ) 30 MG capsule    Sig: Take 1 capsule (30 mg total) by mouth 2 (two) times daily before a meal.    Dispense:  180 capsule    Refill:  1   montelukast  (SINGULAIR ) 10 MG tablet    Sig: Take 1 tablet (10 mg total) by mouth at bedtime.    Dispense:  90 tablet    Refill:  1   triamcinolone  cream (KENALOG ) 0.1 %    Sig: APPLY TO BOTH THIGHS, forearms AND knees TWICE DAILY    Dispense:  80 g    Refill:  1   Evolocumab  (REPATHA  SURECLICK) 140 MG/ML SOAJ    Sig: Inject 140 mg into the skin every 14 (fourteen) days.    Dispense:  6 mL    Refill:  1    No follow-ups on file.

## 2024-01-07 DIAGNOSIS — M9901 Segmental and somatic dysfunction of cervical region: Secondary | ICD-10-CM | POA: Diagnosis not present

## 2024-01-07 DIAGNOSIS — M9902 Segmental and somatic dysfunction of thoracic region: Secondary | ICD-10-CM | POA: Diagnosis not present

## 2024-01-07 DIAGNOSIS — M531 Cervicobrachial syndrome: Secondary | ICD-10-CM | POA: Diagnosis not present

## 2024-01-07 LAB — CMP14+EGFR
ALT: 60 IU/L — ABNORMAL HIGH (ref 0–44)
AST: 54 IU/L — ABNORMAL HIGH (ref 0–40)
Albumin: 4.8 g/dL (ref 3.8–4.9)
Alkaline Phosphatase: 61 IU/L (ref 47–123)
BUN/Creatinine Ratio: 14 (ref 9–20)
BUN: 13 mg/dL (ref 6–24)
Bilirubin Total: 0.7 mg/dL (ref 0.0–1.2)
CO2: 25 mmol/L (ref 20–29)
Calcium: 10.2 mg/dL (ref 8.7–10.2)
Chloride: 100 mmol/L (ref 96–106)
Creatinine, Ser: 0.94 mg/dL (ref 0.76–1.27)
Globulin, Total: 2.5 g/dL (ref 1.5–4.5)
Glucose: 88 mg/dL (ref 70–99)
Potassium: 4 mmol/L (ref 3.5–5.2)
Sodium: 144 mmol/L (ref 134–144)
Total Protein: 7.3 g/dL (ref 6.0–8.5)
eGFR: 94 mL/min/1.73 (ref >59)

## 2024-01-07 LAB — PSA: Prostate Specific Ag, Serum: 1.5 ng/mL (ref 0.0–4.0)

## 2024-01-07 MED ORDER — PREDNISONE 20 MG PO TABS: 20.0000 mg | ORAL_TABLET | Freq: Two times a day (BID) | ORAL | 0 refills | Status: AC

## 2024-01-07 NOTE — Telephone Encounter (Signed)
 Message sent to patient via Mychart that refill request for Ventolin  was sent to the pharmacy yesterday.

## 2024-01-08 LAB — URINE CULTURE: Organism ID, Bacteria: NO GROWTH

## 2024-01-09 ENCOUNTER — Telehealth: Payer: Self-pay | Admitting: Pharmacy Technician

## 2024-01-09 ENCOUNTER — Other Ambulatory Visit (HOSPITAL_COMMUNITY): Payer: Self-pay

## 2024-01-09 DIAGNOSIS — M48062 Spinal stenosis, lumbar region with neurogenic claudication: Secondary | ICD-10-CM | POA: Diagnosis not present

## 2024-01-09 DIAGNOSIS — I10 Essential (primary) hypertension: Secondary | ICD-10-CM | POA: Diagnosis not present

## 2024-01-09 DIAGNOSIS — M5416 Radiculopathy, lumbar region: Secondary | ICD-10-CM | POA: Diagnosis not present

## 2024-01-09 NOTE — Telephone Encounter (Signed)
 Pharmacy Patient Advocate Encounter   Received notification from [facility] that prior authorization for Repatha  Sureclick is required/requested.   Insurance verification completed.   The patient is insured through [employer] .   Per test claim: PA required; PA submitted to above mentioned insurance via Fax Key/confirmation #/EOC   Status is pending Fax # is [phone]

## 2024-01-10 ENCOUNTER — Other Ambulatory Visit (HOSPITAL_COMMUNITY): Payer: Self-pay

## 2024-01-14 ENCOUNTER — Other Ambulatory Visit (HOSPITAL_COMMUNITY): Payer: Self-pay

## 2024-01-14 NOTE — Telephone Encounter (Signed)
 Pharmacy Patient Advocate Encounter  Received notification from [facility] that Prior Authorization for Repatha  SureClick 140mg /ml has been APPROVED from 01/09/24 to 01/08/25. Ran test claim, Copay is $0. This test claim was processed through [facility]- copay amounts may vary at other pharmacies due to pharmacy/plan contracts, or as the patient moves through the different stages of their insurance plan.   PA #/Case ID/Reference #: [id number]   Left a message at [facility] pharmacy to notify of the approval.   Approval letter indexed to media tab.

## 2024-01-28 DIAGNOSIS — M5416 Radiculopathy, lumbar region: Secondary | ICD-10-CM | POA: Diagnosis not present

## 2024-01-30 ENCOUNTER — Ambulatory Visit (HOSPITAL_BASED_OUTPATIENT_CLINIC_OR_DEPARTMENT_OTHER)
Admission: RE | Admit: 2024-01-30 | Discharge: 2024-01-30 | Disposition: A | Discharge status: Home / Self Care | Source: Ambulatory Visit | Attending: Family Medicine | Admitting: Family Medicine

## 2024-01-30 ENCOUNTER — Telehealth: Payer: Self-pay

## 2024-01-30 DIAGNOSIS — R011 Cardiac murmur, unspecified: Secondary | ICD-10-CM | POA: Insufficient documentation

## 2024-01-30 LAB — ECHOCARDIOGRAM COMPLETE
AR max vel: 2.07 cm2
AV Area VTI: 2.1 cm2
AV Area mean vel: 2.06 cm2
AV Mean grad: 7 mmHg
AV Peak grad: 13.7 mmHg
AV Vena cont: 0.2 cm
Ao pk vel: 1.85 m/s
Area-P 1/2: 6.65 cm2
Calc EF: 58.3 %
MV M vel: 4.44 m/s
MV Peak grad: 78.9 mmHg
S' Lateral: 3.5 cm
Single Plane A2C EF: 60.7 %
Single Plane A4C EF: 57.1 %

## 2024-01-30 NOTE — Telephone Encounter (Signed)
 Returned pt's call concerning ECHO. Pt states he forgot this testing was completed in 05/01/2023 as well. Completed testing today.  Requesting Dr. Alvia compares imaging and provides comments in a few days. PCP has been advised.

## 2024-01-30 NOTE — Telephone Encounter (Signed)
 Copied from CRM [id number]. Topic: Clinical - Lab/Test Results >> Jan 30, 2024 10:04 AM Willma SAUNDERS wrote: Reason for CRM: Patient wants to make sure Dr Alvia can see his visit on 05/01/23 with [facility], Dr Vinie Roulette. States he already had testing on his heart then to clear for surgery. Is requesting a callback from Dr Alvia nurse of him to further discuss.  Patient can be reached at [phone]

## 2024-01-31 ENCOUNTER — Other Ambulatory Visit: Payer: Self-pay

## 2024-01-31 DIAGNOSIS — J302 Other seasonal allergic rhinitis: Secondary | ICD-10-CM

## 2024-01-31 DIAGNOSIS — J329 Chronic sinusitis, unspecified: Secondary | ICD-10-CM

## 2024-01-31 MED ORDER — FLUTICASONE PROPIONATE 50 MCG/ACT NA SUSP: 1.0000 | Freq: Every day | NASAL | 2 refills | Status: AC

## 2024-02-04 ENCOUNTER — Encounter: Admitting: Family Medicine

## 2024-02-20 DIAGNOSIS — Z9889 Other specified postprocedural states: Secondary | ICD-10-CM | POA: Diagnosis not present

## 2024-02-20 DIAGNOSIS — I10 Essential (primary) hypertension: Secondary | ICD-10-CM | POA: Diagnosis not present

## 2024-02-20 DIAGNOSIS — M5416 Radiculopathy, lumbar region: Secondary | ICD-10-CM | POA: Diagnosis not present

## 2024-02-20 NOTE — Progress Notes (Addendum)
 [facility]- [facility] Orthopaedic Spine Surgery Outpatient Visit   [name].  DOB: 1965-09-19  MRN: [id number] 02/20/2024     Subjective:    Gabriel Wong. is a 58 y.o. (DOB 11/13/1965) male.     Patient presents with  . Spine - Follow-up     HPI Pt is 10 months s/p L2-L5  lumbar laminectomy on 05/09/23 with Dr. Gust. Pt is somewhat pleased with surgical outcome and progress. He recently has been experiencing more lbp. It is more into the flank and migrates to both sides. He also had some lab work done by his pcp which he states looked good. He states kidney function looked good. He actually states today that the pain has improved quite a bit. He completed a pred pack and had a spinal injections with Dr. Darlean. He played golf recently and it went well. He presents today for injection follow up.  INJECTIONS B S1 TFESI 01/28/24 with good relief.  IMAGING 4 view L-spine X-rays 01/07/24 taken at outlying facility and uploaded to [facility] showing L2-5 laminectomy with stable alignment. He does have some disc degeneration worse at L4-5 and L5-S1   Lumbar MRI 04/16/23 showing New left central disc extrusion with caudal migration at L2-3, resulting in moderate spinal canal stenosis and compression of the  traversing left L3 nerve root in the lateral recess. Unchanged moderate-to-severe spinal canal stenosis at L3-4 and compression of the R>L traversing L5 nerve roots in the subarticular zones at L4-5. At L5-S1 there is  R>L facet arthropathy.    Reviewed and updated this visit by provider: Tobacco  Allergies  Meds  Problems  Med Hx  Surg Hx  Fam Hx       Review of Systems  Constitutional:  Negative for activity change, appetite change, chills, diaphoresis, fatigue, fever and unexpected weight change.  HENT: Negative.    Eyes: Negative.   Respiratory: Negative.  Negative for shortness of breath.   Cardiovascular:  Negative for chest pain,  palpitations and leg swelling.  Gastrointestinal: Negative.   Endocrine: Negative.   Genitourinary: Negative.   Musculoskeletal:  Positive for back pain. Negative for arthralgias, gait problem, joint swelling, myalgias and neck pain.  Skin:  Negative for color change and wound.  Allergic/Immunologic: Negative.   Neurological:  Negative for weakness, numbness and headaches.  Hematological: Negative.   Psychiatric/Behavioral: Negative.  Negative for hallucinations, self-injury and sleep disturbance.       Objective:   Vitals:   02/20/24 0917  Height: 5' 10 (1.778 m)  Weight: 195 lb (88.5 kg)  BMI (Calculated): 28  PainSc:   3  PainLoc: Back    Constitutional: He appears healthy. No distress.  Musculoskeletal:        General: No tenderness or edema.     Cervical back: Normal range of motion.     Lumbar back: Negative right straight leg raise test and negative left straight leg raise test.  Neurological: He is alert and oriented to person, place, and time.  Skin: Skin is warm and dry.  Back Exam   Tenderness  The patient is experiencing no tenderness.   Range of Motion  Extension:  normal  Flexion:  normal  Lateral bend right:  normal  Lateral bend left:  normal  Rotation right:  normal  Rotation left:  normal   Muscle Strength  The patient has normal back strength.  Tests  Straight leg raise right: negative Straight leg raise left: negative  Reflexes  Patellar:  normal Achilles:  normal Biceps:  normal Babinski's sign: normal   Other  Toe walk: normal Heel walk: normal Sensation: normal Gait: normal  Erythema: no back redness Scars: absent            Assessment / Plan:   ASSESSMENT 1. Lumbar radiculopathy (Primary) 2. H/O laminectomy -     methocarbamol (ROBAXIN) 500 mg tablet; Take one tablet (500 mg dose) by mouth 2 (two) times daily., Starting Thu 02/20/2024, Normal    PLAN At this point we discussed things at length. All questions were  answered fully.  At this point he had been having some increased back pain.  I reassured him today that his x-rays overall look good from a surgical standpoint.  He certainly has some arthritis is his back. At this point he is actually doing better. I encouraged him to continue staying active. He will continue doing home exercises. We can plan repeating B S1 TFESI in the future if needed. Flexeril was too strong, therefore we will resume robaxin as needed. New script sent in today. Questions were encouraged and answered. Patient was instructed to call with any new concerns or problems. F/u prn.  Depression screening, including interpretation, was performed.  Total time spent performing and reviewing the screening was > 5 minutes.  Risks, benefits, and alternatives of the medications and treatment plan prescribed today were discussed, and patient expressed understanding. Plan follow-up as discussed or as needed if any worsening symptoms or change in condition.   ________________________________________________________________ [name], PA, spent a total of  15 minutes involved with  Patient care activities including preparing to see the patient such as reviewing the patient record, counseling and educating the patient, family, and/or caregiver, ordering prescription medications, tests, or procedures, and documenting clinical information in the electronic or other health record.  [name], MD, 40 minutes spent today counseling patient, reviewing past medical records, completing physical examination, reviewing radiographic imaging, medication management, coordinating care and completing documentation.

## 2024-02-25 DIAGNOSIS — H2513 Age-related nuclear cataract, bilateral: Secondary | ICD-10-CM | POA: Diagnosis not present

## 2024-02-25 DIAGNOSIS — H35713 Central serous chorioretinopathy, bilateral: Secondary | ICD-10-CM | POA: Diagnosis not present

## 2024-02-25 DIAGNOSIS — H35723 Serous detachment of retinal pigment epithelium, bilateral: Secondary | ICD-10-CM | POA: Diagnosis not present

## 2024-02-25 DIAGNOSIS — H35363 Drusen (degenerative) of macula, bilateral: Secondary | ICD-10-CM | POA: Diagnosis not present

## 2024-03-05 ENCOUNTER — Ambulatory Visit: Payer: Self-pay

## 2024-03-05 NOTE — Telephone Encounter (Signed)
 FYI Only or Action Required?: Action required by provider: Pt wants to be sure it is ok to take both Skyrizi   and Repatha  at the same time/day. PT would like a call back asap.  Patient was last seen in primary care on 01/06/2024 by Alvia Bring, DO.  Called Nurse Triage reporting Medication Problem.  Symptoms began today.  Interventions attempted: Other: called for advise.  Symptoms are: unchanged.  Triage Disposition: Call PCP Now  Patient/caregiver understands and will follow disposition?: Yes                    Copied from CRM #[id number]. Topic: Clinical - Medication Question >> Mar 05, 2024  7:58 AM Antony RAMAN wrote: Reason for CRM: patient wants to know if he can take Evolocumab  (REPATHA  SURECLICK) 140 MG/ML SOAJ  And SKYRIZI  PEN 150 MG/ML SOAJ Together at the same time today Reason for Disposition  [1] Pharmacy calling with prescription question AND [2] triager unable to answer question  Answer Assessment - Initial Assessment Questions 1. NAME of MEDICINE: What medicine(s) are you calling about?     Skyrizi  and Repatha  2. QUESTION: What is your question? (e.g., double dose of medicine, side effect)     Can both be taken on the same day/time 3. PRESCRIBER: Who prescribed the medicine? Reason: if prescribed by specialist, call should be referred to that group.     PCP 4. SYMPTOMS: Do you have any symptoms? If Yes, ask: What symptoms are you having?  How bad are the symptoms (e.g., mild, moderate, severe)     no  Protocols used: Medication Question Call-A-AH

## 2024-03-06 ENCOUNTER — Telehealth: Payer: Self-pay | Admitting: Family Medicine

## 2024-03-06 NOTE — Telephone Encounter (Signed)
 Patient returned your call.

## 2024-03-06 NOTE — Telephone Encounter (Signed)
 No note needed here

## 2024-03-16 NOTE — Telephone Encounter (Signed)
 Last read by Lemond ONEIDA Schooner at 1:09PM on 03/10/2024.

## 2024-03-16 NOTE — Telephone Encounter (Signed)
 Last read by Lemond ONEIDA Schooner at 1:09PM on 03/10/2024.  Patient has seen message regarding the vaccines needed and to contact the office to schedule a nurse visit for this to be administered.

## 2024-03-19 ENCOUNTER — Ambulatory Visit

## 2024-03-24 ENCOUNTER — Ambulatory Visit

## 2024-04-07 ENCOUNTER — Ambulatory Visit

## 2024-04-07 DIAGNOSIS — Z23 Encounter for immunization: Secondary | ICD-10-CM | POA: Diagnosis not present

## 2024-04-17 ENCOUNTER — Other Ambulatory Visit: Payer: Self-pay | Admitting: Family Medicine

## 2024-04-27 ENCOUNTER — Other Ambulatory Visit: Payer: Self-pay | Admitting: Family Medicine

## 2024-04-27 DIAGNOSIS — J4530 Mild persistent asthma, uncomplicated: Secondary | ICD-10-CM

## 2024-04-27 DIAGNOSIS — E782 Mixed hyperlipidemia: Secondary | ICD-10-CM

## 2024-04-27 DIAGNOSIS — E781 Pure hyperglyceridemia: Secondary | ICD-10-CM

## 2024-04-28 ENCOUNTER — Other Ambulatory Visit: Payer: Self-pay | Admitting: Family Medicine

## 2024-05-15 ENCOUNTER — Ambulatory Visit: Payer: Self-pay | Admitting: *Deleted

## 2024-05-15 NOTE — Telephone Encounter (Signed)
 Spoke with the patient advised that Dr. Alvia doesn't administer those type of injections. He voiced his understanding. It is his right shoulder. Patient is asking if you would like to send him for imaging before his upcoming appointment on 2/3 he also has some methocarbamol left from his back surgery that he hasn't used would like to know if you recommend he take that to help? He was referred to Pt 01/07/24 for back and sciatica pain. Patient was a Pt of Dr. ONEIDA. Please advise

## 2024-05-15 NOTE — Telephone Encounter (Signed)
 FYI Only or Action Required?: Action required by provider: request for appointment, clinical question for provider, and requesting appt today but scheduled for 05/19/24. Gabriel Wong  Patient was last seen in primary care on 01/06/2024 by Alvia Bring, DO.  Called Nurse Triage reporting Shoulder Pain.  Symptoms began several weeks ago.  Interventions attempted: Rest, hydration, or home remedies.  Symptoms are: gradually worsening.  Triage Disposition: See PCP When Office is Open (Within 3 Days)  Patient/caregiver understands and will follow disposition?: Yes    Multiple questions for recommendations prior to appt on Tuesday . Please advise if injection in shoulder is an option from PCP and if a sling is recommended.           Reason for Disposition  [1] MODERATE pain (e.g., interferes with normal activities) AND [2] present > 3 days  Answer Assessment - Initial Assessment Questions No available appt today as requested. Scheduled appt for 05/19/24. Patient requesting if PCP would consider injection in shoulder today or if physical therapy should be ordered and if a sling is recommended until being seen Tuesday. Patient reports he does not take tylenol  and only takes naproxen for pain but has not been taking any medication for pain at this time. Recommended to allow shoulder to rest no heavy lifting. 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 Patient requesting a call back.  Recommended if pain worsens go to [facility] or UC or ED .        1. ONSET: When did the pain start?     5 weeks  2. LOCATION: Where is the pain located?     Right shoulder 3. PAIN: How bad is the pain? (Scale 1-10; or mild, moderate, severe)     Moderate  4. WORK OR EXERCISE: Has there been any recent work or exercise that involved this part of the body?     Lifting  5. CAUSE: What do you think is causing the shoulder pain?      Strain in right shoulder from lifting  6. OTHER SYMPTOMS: Do you have any other symptoms? (e.g., neck pain, swelling, rash, fever, numbness, weakness)     Right shoulder pain worsening when lifting arm in front of body, not sleeping , weakness with lifting.  Pain comes and goes. Reports cold sx and taking z pack, mild vertigo.  Denies chest pain no difficulty breathing no fever no swelling in arm, no N/T.  Protocols used: Shoulder Pain-A-AH

## 2024-05-18 ENCOUNTER — Telehealth: Payer: Self-pay | Admitting: Family Medicine

## 2024-05-18 NOTE — Telephone Encounter (Signed)
 FYI - The patient has been updated of the provider's note regarding option of injection and the muscle relaxant. Per the patient, shoulder pain presents after repetitious movements. States he did 18 holes of golfing. Other than that, he said the discomfort is tolerable and he's currently taking 1 tab of Naproxen as he needs it. The patient has an appointment with the provider next Tuesday and has confirmed to keep this appointment.

## 2024-05-18 NOTE — Telephone Encounter (Signed)
 Called patient and rescheduled appointment to 05/26/2024 at 9:10 am with Dr. Alvia due to patients work schedule. Pt is also asking for  xray orders. Please advise.

## 2024-05-19 ENCOUNTER — Ambulatory Visit: Admitting: Family Medicine

## 2024-05-19 NOTE — Telephone Encounter (Signed)
 Pt aware.

## 2024-05-26 ENCOUNTER — Ambulatory Visit: Admitting: Family Medicine
